# Patient Record
Sex: Male | Born: 1935 | Race: White | Hispanic: No | Smoking: Never smoker
Health system: Southern US, Community
[De-identification: ages and names within clinical notes are randomized; demographics above are authoritative.]

## PROBLEM LIST (undated history)

## (undated) DIAGNOSIS — E7800 Pure hypercholesterolemia, unspecified: Secondary | ICD-10-CM

## (undated) DIAGNOSIS — E119 Type 2 diabetes mellitus without complications: Secondary | ICD-10-CM

## (undated) DIAGNOSIS — I10 Essential (primary) hypertension: Secondary | ICD-10-CM

## (undated) DIAGNOSIS — E039 Hypothyroidism, unspecified: Secondary | ICD-10-CM

---

## 2002-06-30 ENCOUNTER — Ambulatory Visit (HOSPITAL_COMMUNITY): Admission: RE | Admit: 2002-06-30 | Discharge: 2002-06-30 | Payer: Self-pay | Admitting: Internal Medicine

## 2002-10-23 ENCOUNTER — Encounter: Payer: Self-pay | Admitting: Orthopedic Surgery

## 2002-10-23 ENCOUNTER — Encounter: Admission: RE | Admit: 2002-10-23 | Discharge: 2002-10-23 | Payer: Self-pay | Admitting: Orthopedic Surgery

## 2005-07-03 ENCOUNTER — Inpatient Hospital Stay (HOSPITAL_COMMUNITY): Admission: RE | Admit: 2005-07-03 | Discharge: 2005-07-06 | Payer: Self-pay | Admitting: Orthopedic Surgery

## 2006-06-25 ENCOUNTER — Encounter: Admission: RE | Admit: 2006-06-25 | Discharge: 2006-06-25 | Payer: Self-pay | Admitting: Orthopedic Surgery

## 2006-07-11 ENCOUNTER — Encounter: Admission: RE | Admit: 2006-07-11 | Discharge: 2006-07-11 | Payer: Self-pay | Admitting: Orthopedic Surgery

## 2006-08-03 ENCOUNTER — Encounter: Admission: RE | Admit: 2006-08-03 | Discharge: 2006-08-03 | Payer: Self-pay | Admitting: Orthopedic Surgery

## 2006-08-24 ENCOUNTER — Encounter: Admission: RE | Admit: 2006-08-24 | Discharge: 2006-08-24 | Payer: Self-pay | Admitting: Orthopedic Surgery

## 2006-09-07 ENCOUNTER — Ambulatory Visit: Payer: Self-pay | Admitting: Internal Medicine

## 2006-09-07 ENCOUNTER — Emergency Department (HOSPITAL_COMMUNITY): Admission: EM | Admit: 2006-09-07 | Discharge: 2006-09-07 | Payer: Self-pay | Admitting: Emergency Medicine

## 2006-09-14 ENCOUNTER — Ambulatory Visit: Payer: Self-pay | Admitting: Internal Medicine

## 2007-11-21 ENCOUNTER — Inpatient Hospital Stay (HOSPITAL_COMMUNITY): Admission: RE | Admit: 2007-11-21 | Discharge: 2007-11-25 | Payer: Self-pay | Admitting: Orthopedic Surgery

## 2007-11-25 ENCOUNTER — Encounter (INDEPENDENT_AMBULATORY_CARE_PROVIDER_SITE_OTHER): Payer: Self-pay | Admitting: Orthopedic Surgery

## 2007-11-25 ENCOUNTER — Ambulatory Visit: Payer: Self-pay | Admitting: *Deleted

## 2011-01-10 NOTE — Op Note (Signed)
NAME:  Ricky Sandoval, Ricky Sandoval               ACCOUNT NO.:  [id number]   MEDICAL RECORD NO.:  [id number]          PATIENT TYPE:  INP   LOCATION:  0001                         FACILITY:  [facility]   PHYSICIAN:  John L. Rendall, M.D.  DATE OF BIRTH:  1922/07/27   DATE OF PROCEDURE:  11/21/2007  DATE OF DISCHARGE:                               OPERATIVE REPORT   PREOPERATIVE DIAGNOSIS:  Osteoarthritis right knee.   POSTOPERATIVE DIAGNOSIS:  Osteoarthritis right knee.   SURGICAL PROCEDURES:  Right LCS total knee replacement with computer  navigation assistance.   SURGEON:  Dr. Erasmo Leventhal.   ASSISTANT:  Arnoldo Morale, PA-C.   ANESTHESIA:  Spinal.   PATHOLOGY:  The patient has 4 degrees varus and 6 degrees flexion  deformity at the start of the case according to the computer. At the end  of the case, she was one and one respectively.  She had completely worn  out the medial compartment and extensive degeneration in the  patellofemoral articulation.   PROCEDURE:  Under spinal anesthesia, the right leg was prepared with  DuraPrep and draped as a sterile field. A sterile proximal is tourniquet  used.  The leg is wrapped out with an Esmarch and the tourniquet is used  at 300 mm.  A midline incision is made, the patella is everted.  The  knee is sized as a standard plus from the femur.  Debridement is done in  preparation for computer navigation. Two pin holes are placed in the  superomedial tibia through stab wounds and two pins are placed in the  distal medial femur for the arrays and the arrays are set up. At this  point, computer mapping is begun finding the hip center, medial and  lateral malleoli, mapping of the proximal tibia and distal femur.  Once  this was completed. Within 1 mm of anatomic reproducible accuracy.  Attention was turned to the proximal tibial cut.  This was done with the  computer within 1 degree of accuracy.  Once this was completed, the  ligament balancing is done and the  first femoral cut is made on the  anterior and posterior flare of the distal femur within 1 degree of  rotational anatomic alignment.  Once this was completed, the flexion gap  was judged to be approximately 12-1/2, the distal femoral cut was then  made and this turns out to be more near 15 mm. It turns out that with  approximate 15 mm, the alignment was excellent. Attention is then turned  to debriding the posterior recesses and taking spurs off the back of the  femoral condyles.  Once this is completed, the recessing guide is used.  The proximal tibia is then exposed and center peg hole with keel are  inserted.  A trial reduction of a #4 tibia, 15 bearing and a standard  plus femur reveals excellent alignment and slight hyperextension.  Attention is turned to a 17.5 bearing which at first seems a little bit  tight but after several rounds of range of motion it becomes obvious  that this is actually quite a good fit.  The knee is stable to varus  valgus and drawer testing and flexes about 120 degrees on the table with  very little stress. At this point, the patella is osteotomized through a  peg hole, the  trial is done. The arrays were taken down.  Permanent  components were obtained and cement was mixed.  Once the components were  inserted and excess cement was removed  attention is turned to a full  synovectomy of the anterior knee while the  cement hardens.  The tourniquet is then let down at about an hour and 15  minutes.  Multiple small vessels are cauterized.  A medium Hemovac drain  is inserted.  The knee is then closed in layers with #1 Vicryl, 2-0  Vicryl and skin clips.  The patient returned to recovery in good  condition.  Blood loss less than 100 mL.      John L. Rendall, M.D.  Electronically Signed     JLR/MEDQ  D:  11/21/2007  T:  11/21/2007  Job:  [id number]

## 2011-01-10 NOTE — Discharge Summary (Signed)
NAME:  Ricky Sandoval, Ricky Sandoval               ACCOUNT NO.:  [id number]   MEDICAL RECORD NO.:  [id number]          PATIENT TYPE:  INP   LOCATION:  1621                         FACILITY:  [facility]   PHYSICIAN:  John L. Rendall, M.D.  DATE OF BIRTH:  May 01, 1922   DATE OF ADMISSION:  11/21/2007  DATE OF DISCHARGE:  11/25/2007                               DISCHARGE SUMMARY   ADMISSION DIAGNOSIS:  Osteoarthritis of the right knee.   DISCHARGE DIAGNOSES:  1. Osteoarthritis of the right knee.  2. Acute blood loss anemia.  3. Hypertension.  4. Gastroesophageal reflux disease.  5. Diabetes mellitus, diet controlled.  6. Hypercholesterolemia.  7. Hypothyroidism.  8. Sleep apnea.   PROCEDURE:  Right total knee arthroplasty.   HISTORY:  An 77 year old white male with history of left total knee  arthroplasty in February 2006 presented with a 2-year history of sudden  onset of progressive right knee pain.  He denies any injury or trauma.  Pain is intermittent and is a sharp sensation over the medial knee, with  radiation up and down the leg.  It increases with a sharp sensation with  walking and decreases with rest.  Vicodin is moderately helpful.  He  denies any catching, problems with  giving way, or locking.  He has not  had any corticosteroid, viscosupplementation, or use of assistive  devices.  He is admitted at this time for total knee arthroplasty.   HOSPITAL COURSE:  An 77 year old white male, admitted November 21, 2007.  After appropriate laboratory studies were obtained, as well as 1 g of  Ancef IV on call to the operating room, he was taken to the operating  room, where he underwent a right total knee arthroplasty performed by  Dr. Erasmo Leventhal, assisted by Arnoldo Morale, PA-C.  He tolerated the  procedure well.  He was placed on a Dilaudid PCA pump for pain  management.  Ancef 1 g IV q.6 h. x3 doses was given.  He was started on  Arixtra 2.5 mg subcutaneously q. 8 p.m. for 7 days.  Also Celebrex  400  mg p.o. in the PACU and then 200 b.i.d..  Lyrica 50 mg p.o. b.i.d. was  started in the PACU.  OxyContin 10 mg p.o. q.12 h., first dose in the  PACU.  Foley was placed intraoperatively.  CPM was placed from 0-90  degrees for 6-8 hours per day.  Consultations with PT, OT, and care  management were made, allowing weightbearing as tolerated.  A sliding  scale was also ordered in a sensitive protocol.  His Foley was  discontinued on November 22, 2007, and he was allowed out of bed to chair.  His diet was changed to medium to moderate carb.  Sliding scales were  ordered.  The remainder of his hospital course was uneventful and  dedicated to physical and occupational therapy.  Once he was ambulatory  and stable, it was felt that he was a candidate for discharge.  Therefore, we feel that he is able to be transferred to the [facility] facility today after his Dopplers of his right leg.  LABORATORY STUDIES:  He was admitted with a hemoglobin of 12.3,  hematocrit 36.7%, white count 5300, platelets were not available on this  results sheet.  His MCV was 102.  Discharge hemoglobin 9.5, hematocrit  26.4%, white count 7800, platelets 173,000.  Preoperative sodium 136,  potassium 4.5, chloride 102, CO2 27, glucose 98, BUN 19, creatinine  0.18, calcium 9.1, protein 6.4, albumin 3.8, SGOT 28, SGPT 23, alk phos  29, bilirubin 0.7, and GFR was 59.  Discharge sodium 137, potassium 3.9,  chloride 103, CO2 29, glucose 145, BUN 13, creatinine 1.14.  Initial pro  time 12, INR 0.9, PTT was 22.  Urinalysis was benign for voided urine  preoperatively.  Urine culture showed no growth.  Chest x-Ricky Sandoval from  October 07, 2007 revealed no active disease.  EKG of October 07, 2007  was normal.   DISCHARGE INSTRUCTIONS:  He is to be discharged on a medium carb-  modified diet.  Increase his activity slowly.  Use his walker,  weightbearing as tolerated.  No lifting or driving for 6 weeks.  He will  follow the blue  instruction sheet.  CPM from 0-90 degrees for 6-8 hours  per day.   MEDICATIONS:  1. Prilosec OTC 1 p.o. daily to b.i.d.  2. Levothyroxine 0.05 daily.  3. Losartan 50 mg daily.  4. Simvastatin 20 mg at bedtime.  5. Centrum Silver daily.  6. Claritin 10 mg daily.  7. Arixtra 2.5 mg inject subcutaneously at 8 p.m. daily, last dose      November 27, 2007.  8. May begin aspirin 81 mg on November 28, 2007, 1 daily.  9. Celebrex 200 mg p.o. b.i.d. for 7 days, then 1 p.o. daily.  10.OxyContin 10 mg p.o. q.12 h. for 7 days, then discontinue.  11.Percocet 5/325, 1-2 tabs p.o. q.4 h. p.r.n. pain.  12.Oxygen can be used at night time as preoperatively noted.   Exercise program to include range of motion and strengthening, as per  total knee replacement protocol.  The wound should be dressed daily and  coated with Betadine over the staples and then a 4x4 dressing along the  incision line.  If there are any problems with increased swelling,  drainage, or fevers, he is to call the office immediately.  Chest pain  or  shortness of breath, call 9-1-1.  Otherwise, will follow back up  with the office on Tuesday December 03, 2007 for Dr. Priscille Kluver to remove his  staples.      Oris Drone Petrarca, P.A.-C.      Carlisle Beers. Rendall, M.D.  Electronically Signed    BDP/MEDQ  D:  11/25/2007  T:  11/25/2007  Job:  [id number]   cc:   Jonny Ruiz L. Rendall, M.D.  Fax: [phone]   Legrand Pitts. Duffy, P.A.

## 2011-01-13 NOTE — Assessment & Plan Note (Signed)
[facility]                             PULMONARY OFFICE NOTE   Ricky, Sandoval                      MRN:          [id number]  DATE:09/07/2006                            DOB:          04/19/1922    CHIEF COMPLAINT:  Cough, dyspnea.   HISTORY:  An 77 year old white male with a chronic cough dating back  several years consisting mostly of a sensation of scratchy and throat  drainage that bothers him mostly at night.  He had a recent flare up of  these symptoms several weeks ago and has been under treatment by Dr.  Felipa Eth with treatment directed at rhinitis and sinusitis but had a severe  spell where he coughed so hard he could not get his breath and went to  the emergency room this morning where he was treated as an asthmatic  with prednisone and inhalers.  He therefore referred himself to me for  pulmonary evaluation.   Presently, the patient is comfortable except he has no voice.  He has  very difficult time with phonation and has classic voice fatigue; that  is the more he talks the more hoarse he becomes.  He did not have any  choking spells, however, during our conservation.  He also has  dysphagia and sensation of sore throat but no fever, orthopnea, PND or  leg swelling.  No chest pain of any kind and he states if he is not  coughing or choking his breathing is fine.  In between these spells.   PAST MEDICAL HISTORY:  Significant for hypertension for which she has  been on ACE inhibitors.  Dr. Felipa Eth had thought the lisinopril had been  stopped a week ago per my conversation with him today but the patient is  still taking it.  He also has hyperlipidemia and diabetes and sinus  problems.   ALLERGIES:  None known.   MEDICATIONS:  Presently, the patient is on Augmentin per Dr. Felipa Eth,  aspirin, Zocor, nabutametone, flunisolide nasal spray, Levothroid,  lisinopril and Astelin, along with Tussin p.r.n.   SOCIAL HISTORY:  The patient has never  smoked.  She is retired but no  unusual travel, pet or hobby exposure.   FAMILY HISTORY:  Recorded in detail and on the worksheet significant for  the absence of respiratory disease in H&P.   REVIEW OF SYSTEMS:  Taken in detail on the worksheet.  Significant for  problems as outlined above.   PHYSICAL EXAMINATION:  This is a pleasant, elderly white male in no  acute distress with classic voice fatigue as noted.  He has afebrile  with a blood pressure of 120/60.  HEENT is remarkable for frequent  throat clearing; however, there is no evidence of excessive postnasal  drainage or cobblestoning.  Nasal turbinates are normal.  Ear canals are  clear bilaterally.  Neck was supple without cervical adenopathy or  tenderness.  Trachea is midline.  No thyromegaly.  Lung fields are  perfectly clear except for classic pseudowheeze.  There a regular rhythm  without rub or gallop with a 1/6 systolic ejection murmur.  Abdomen  is  soft and benign.  Extremities are warm without calf tenderness,  cyanosis, clubbing or edema.   IMPRESSION:  Classic upper airway dysfunction associated with choking,  dysphagia and a sore throat all of which are occurring in a patient on  chronic ACE inhibitors who in retrospect has been having trouble with  increased throat clearing dating back several years.  Most likely, he  has tolerated a level of ACE effect on his upper airway until  his  recent upper respiratory tract infection/sinus infection for which he is  under treatment by Dr. Felipa Eth already.   Therefore, I have recommended the following:  1. Definitely he should stop lisinopril as Dr. Felipa Eth had already      recommended and start Benicar 40 one-half daily in its place.  I      took the bottle from him, made sure he understood which one it was      and gave him written instructions in large print so he does not      continue the lisinopril surreptitiously.  2. Empirically, I am going to start him on Protonix 40  mg 30 minutes      before his first and last meal just until he is better since reflux      can aggravate upper airway instability via ACE inhibitors in a      synergistic fashion.  3. Complete course of Augmentin.  4. Six days of prednisone should be all that is necessary to control      the upper airway inflammatory component as the problem.  5. He is still coughing and I would like him to use Tramadol 50 mg one      every 4 hours p.r.n. to eliminate a cyclical pattern from      developing.   I assured the patient that he will gradually improve over the next  several weeks but the benefit would not be immediate and that he could  control the cough with the Tramadol in the meantime as well as using  nonmenthol containing lozenges and cough drops and sips of water to  prevent the choking sensation and excessive throat clearing from  contributing to upper airway instability and further trauma through a  cyclical mechanism.     Charlaine Dalton. Sherene Sires, MD, [facility]  Electronically Signed    MBW/MedQ  DD: 09/08/2006  DT: 09/08/2006  Job #: [id number]   cc:   Larina Earthly, M.D.

## 2011-01-13 NOTE — Assessment & Plan Note (Signed)
[facility]                             PULMONARY OFFICE NOTE   Ricky Sandoval, INTRIAGO                      MRN:          [id number]  DATE:09/14/2006                            DOB:          February 19, 1922    The patient is an 77 year old white male patient of Dr. Sherene Sires, who has a  known history of chronic cough, returns to the office today complaining  of persistent cough.  The patient was seen in the office last week and  prescribed a prednisone taper, and tramadol as needed for cough  suppression.  The patient also was changed off of his ACE inhibitor over  to Benicar.  The patient had previously been placed on Augmentin by his  primary care physician and was instructed to finish this.  The patient  has now finished Augmentin and prednisone, and is using cough tramadol  for cough control.  However, reports his symptoms have improved, but  have not resolved.  The patient denies any hemoptysis, chest pain,  orthopnea, PND, or leg swelling.  The patient does report he had some  loose stools, but that seems to be improving.   PAST MEDICAL HISTORY:  Reviewed.   CURRENT MEDICATIONS:  Reviewed.   PHYSICAL EXAM:  The patient is an elderly male in no acute distress.  He is afebrile with stable vital signs.  O2 saturation is 94% on room  air.  HEENT:  Unremarkable.  NECK:  Supple without adenopathy.  No JVD.  LUNGS:  Sounds reveal diminished breath sounds in the bases.  CARDIAC:  Regular rate and rhythm.  ABDOMEN:  Soft and nontender.  EXTREMITIES:  Warm without any edema.   IMPRESSION AND PLAN:  1. Recent tracheobronchitis with persistent cough.  The patient is      advised to add in Mucinex DM twice daily.  The patient does have      frequent throat-clearing throughout the exam with complaints of      some post-nasal drip.  The patient is to add in Claritin daily and      tooth use saline nasal spray in addition to aggressive cough      suppression with  tramadol and non-mint lozenges.  The patient will      remain off of his ACE inhibitor due to upper airway instability.      The patient was given a Xopenex nebulizer treatment in the office      today.  The patient will      return back with Dr. Sherene Sires as scheduled.  2. Hypertension is well-maintained on Benicar.      Rubye Oaks, NP  Electronically Signed      Charlaine Dalton. Sherene Sires, MD, [facility]  Electronically Signed   TP/MedQ  DD: 09/17/2006  DT: 09/17/2006  Job #: [id number]

## 2011-01-13 NOTE — Op Note (Signed)
NAME:  Ricky Sandoval, Ricky Sandoval               ACCOUNT NO.:  [id number]   MEDICAL RECORD NO.:  [id number]          PATIENT TYPE:  INP   LOCATION:  X002                         FACILITY:  [facility]   PHYSICIAN:  John L. Rendall, M.D.  DATE OF BIRTH:  Oct 28, 1921   DATE OF PROCEDURE:  07/03/2005  DATE OF DISCHARGE:                                 OPERATIVE REPORT   PREOPERATIVE DIAGNOSIS:  Osteoarthritis left knee.   POSTOPERATIVE DIAGNOSIS:  Osteoarthritis left knee.   SURGICAL PROCEDURES:  Left LCS total knee replacement with computer  navigation assistance.   SURGEON:  John L. Rendall, M.D.   ASSISTANT:  Legrand Pitts. Duffy, P.A.-C.   ANESTHESIA:  Spinal.   PATHOLOGY:  Bone against bone medially in an 77 year old whose tried all  conservative measures. The patient has 5 degrees varus and 4 degrees fixed  flexion from anatomical axis. At the start of computer navigation, there was  bone against bone medially with spurs around the entire distal femur. At the  end of the procedure, there was 1 degree of valgus and 1 degree of flexion.   DESCRIPTION OF PROCEDURE:  Under spinal anesthesia, the left leg was  prepared with DuraPrep and draped as a sterile field. A sterile Esmarch was  used and a tourniquet and the tourniquet is elevated to 350 mm. A midline  incision is made, the patella is everted. The femur is sized at a large.  Debridement of the knee is done in preparation for computer navigation. At  this point, Schanz pins are placed in the proximal medial tibia and the  distal medial femur. With the navigation pins in place, the hip center is  found, the medial and lateral malleolus and then mapping of the proximal  tibia and distal femur is done. Once these are completed, the tibial guide  is used with the computer for a proximal tibial resection. The balancer is  then inserted and it takes an extensive release on the medial tibia to  balance the extension gap. With this corrected and the  flexion gaps  measured, the rest of the case is planned. The large tibial component is to  be used and approximately a 12.5 bearing. At this point, the first distal  femoral cuts were made on the anterior and posterior distal femur with  computer navigation assistance. The distal femoral cut is made. All cuts are  within 1 degree of anatomical axis. Once this was completed, the recessing  guide is used. Care was taken following this to debride remnants of the  menisci and cruciate's and take spurs off the back of the femoral condyles.  The McKale and Homan are then inserted, the tibia is sized to a #4, center  peg hole with keel is inserted. Trial reduction of #4 tibia,  12.5 bearing  and large femur reveals excellent fit, alignment and stability. The patella  is then osteotomized, three PEG holes inserted. With the patella in place  and the capsule closed, the knee is in 1 degree of anatomical axis and 1  degree of being fully straight. At this point, the Schanz  pins are removed,  permanent components are obtained. Bony surfaces prepared with pulse  irrigation and all components are cemented in place.  Once the cement is hardened, the tourniquet is let down at an hour and 15  minutes. Excess cement was removed, multiple small vessels are cauterized.  The knee is then closed in layers with #1 Tycron, #1 and 2-0 Vicryl and skin  clips. The patient tolerated the procedure well and returned to recovery in  good condition.      John L. Rendall, M.D.  Electronically Signed     JLR/MEDQ  D:  07/03/2005  T:  07/03/2005  Job:  [id number]

## 2011-01-13 NOTE — H&P (Signed)
NAME:  Ricky Sandoval, Ricky Sandoval               ACCOUNT NO.:  [id number]   MEDICAL RECORD NO.:  [id number]          PATIENT TYPE:  INP   LOCATION:  NA                           FACILITY:  [facility]   PHYSICIAN:  John L. Rendall, M.D.  DATE OF BIRTH:  11-12-1921   DATE OF ADMISSION:  DATE OF DISCHARGE:                                HISTORY & PHYSICAL   DATE OF ADMISSION:  July 03, 2005   CHIEF COMPLAINT:  Left knee pain.   HISTORY OF PRESENT ILLNESS:  Ricky Sandoval is a pleasant 77 year old white male  with left knee pain since 2001. The patient did have a left knee scope in  September 2003 at which time he underwent a knee arthroscopy with medial  meniscectomy, chondroplasty, and microfraction medial femoral condyle. The  patient did relatively well until late August 2006 whenever he developed  dull achy pain with weightbearing activity or twisting motions. He denies  any mechanical symptoms. Does not use any assistive devices. Has  questionable waking pain. X-rays of the left knee reveal bone against bone  in the medial compartment.   ALLERGIES:  No known drug allergies. The patient does have allergies to DOG  and CAT; also, to GRASS, POLLENS. Denies any metal allergies.   MEDICATIONS:  1.  Simvastatin 20 mg once daily q.h.s.  2.  Levoxyl 50 mcg one-half tablet q.a.m. one-half hour prior to meals.  3.  Piroxicam 10 mg one p.o. b.i.d.  4.  Vicodin 5/500 mg p.r.n. pain.  5.  Aspirin 81 mg daily, stopped October 26.  6.  Flunisolide 0.025% b.i.d.  7.  Centrum Silver one p.o. daily.  8.  Gingko biloba 30 mg one p.o. b.i.d.  9.  Glucosamine chondroitin one p.o. b.i.d.  10. Salmon fish oil with Omega-3 fatty acids one p.o. daily.  11. Claritin 10 mg one p.o. q.a.m.  12. Weekly allergy shots, two shots each Monday.  13. Prostater one p.o. b.i.d.   PAST MEDICAL HISTORY:  1.  Diet- and exercise-controlled diabetes mellitus type 2.  2.  Hypertension.  3.  Postnasal drip.  4.  Allergies.  5.   Hyperlipidemia.  6.  Hypothyroidism.   The patient denies any cardiac or respiratory disease.   PAST SURGICAL HISTORY:  1.  Carpal tunnel surgery.  2.  Nasal surgery 25 years ago.  3.  Left knee arthroscopy in 2003 by Dr. Priscille Kluver.   The patient denies any complications with the above procedures. Denies any  blood transfusions in the past.   SOCIAL HISTORY:  The patient denies any tobacco or alcohol use. He is  married and has three grown children. He lives in a [place] home with  three steps to the usual entrance. He is retired. Primary care physician is  Dr. Felipa Eth, [facility], [place], [place].   FAMILY HISTORY:  The patient's mother deceased at age 59 with diabetes  mellitus. Father deceased at age 55 with coronary artery disease and  hypertension. Has two deceased brothers, both with cardiac disease, one age  32, the other age 50 at the time of death. The  patient has one sister who is  64 years old. She has diabetes mellitus, heart disease, history of  esophageal/throat cancer.   REVIEW OF SYSTEMS:  The patient denies any recent cough, cough, fever, or  flu-like symptoms. Does have allergies to cats, dogs, and pollens. Has  diabetes mellitus which is diet and exercise controlled. Hypertension, new  diagnosis. The patient denies any chest pain, shortness of breath,  orthopnea, or PND. The patient does have some shortness of breath with  exertion greater than three to four blocks. No GI or GU symptoms. The  patient does have a Living Will and a power of attorney, Vella Raring,  wife. Otherwise, review of systems is negative or noncontributory.   PHYSICAL EXAMINATION:  GENERAL:  The patient is a well-developed, well-  nourished male who walks with a slight limp and antalgic gait on the left.  He uses no assistive devices to ambulate. The patient's mood and affect are  appropriate, talks easily with examiner. The patient's height is 5 feet 6.5   inches, weight is 198 pounds.  VITAL SIGNS:  Temperature 96.4, blood pressure 150/80, respiratory rate 16,  pulse is 80.  HEENT:  Head is normocephalic, atraumatic, without frontal or maxillary  sinus tenderness to palpation. Conjunctivae are pink and moist. Sclerae are  nonicteric. PERRLA. EOMs are intact. No visible external ear deformities  noted. TMs pearly and gray bilaterally. Nose and nasal septum midline, nasal  mucosa pink and moist. Buccal mucosa pink and moist. The patient has good  dental repair. Pharynx without erythema or exudate, tongue and uvula  midline.  NECK:  Trachea is midline. No lymphadenopathy. Carotids 2+ without bruits.  No tenderness with palpation along the cervical spine. The patient has full  range of motion of the cervical spine without pain.  BACK:  Nontender to palpation over the thoracic and lumbar spinal columns.  CARDIAC:  Regular rate and rhythm. No murmurs, rubs, or gallops noted.  CHEST:  Lungs clear to auscultation bilaterally. No wheezing, rhonchi, or  rales noted. No accessory muscles used for respiratory compensation.  ABDOMEN:  Soft, nontender, bowel sounds x4 quadrants.  BREAST, GENITOURINARY, AND RECTAL:  All deferred at this time.  NEUROLOGIC:  The patient is alert and oriented x3. Cranial nerves II-XII  grossly intact. Lower extremity strength testing reveals 5/5 strength  throughout. Deep tendon reflexes are 2+ at the knees bilaterally, ankle  jerks are 1+ bilaterally.  MUSCULOSKELETAL, UPPER EXTREMITIES:  The patient has full range of motion of  the upper extremities at the shoulders, elbows, wrists. Does have some  degenerative changes of the hands; otherwise, the upper extremities are  equal and symmetric in size and shape. Radial pulses are 2+ bilaterally.  MUSCULOSKELETAL, LOWER EXTREMITIES:  The patient has full range of motion of both hips without pain. Flexion to 90 degrees of both hips bilaterally  causes no pain. Bilateral knees  0-120 degrees with flexion. The patient has  medial joint line pain with palpation over the left knee. The left knee with  approximate 5-degree varus deformity. Anterior drawers negative bilaterally.  Valgus-varus stressing is negative bilaterally. The lower extremities are  otherwise nonedematous and posterior tibial pulses are 2+ bilaterally. The  patient has good sensation in those throughout.   X-rays dated May 04, 2005, of the left knee show bone-on-bone medially.  In Dr. Vicente Males note of May 18, 2005, the patient had an EKG on  May 09, 2005, which showed normal sinus rhythm with no acute changes.  IMPRESSION:  1.  End-stage osteoarthritis, left knee.  2.  Diabetes mellitus type 2 controlled with diet and exercise.  3.  New diagnosis of hypertension.  4.  Allergies.  5.  Hyperlipidemia.  6.  Hypothyroidism.  7.  Posterior nasal drainage.   PLAN:  The patient is to be admitted to [facility] on July 03, 2005, to undergo a left knee arthroscopy by Dr. Priscille Kluver using computer  navigation. Prior to surgery, the patient will undergo all preoperative labs  and testing. The patient did receive medical clearance from Dr. Felipa Eth.   The patient was placed on a blood pressure medicine of unknown dosage and  name. The patient is to bring all medications to his anesthesia evaluation  so he can be put on the proper medications during his hospitalization.      Richardean Canal, P.A.      John L. Rendall, M.D.  Electronically Signed    GC/MEDQ  D:  06/23/2005  T:  06/23/2005  Job:  [id number]

## 2011-01-13 NOTE — Assessment & Plan Note (Signed)
[facility]                                 ON-CALL NOTE   BRYLEY, KOVACEVIC                      MRN:          [id number]  DATE:09/09/2006                            DOB:          June 21, 1922    I had received a call from Mr. Ricky Sandoval today with regards to his feelings  of choking.  He says that, what has been happening is, after he had seen  Dr. Sherene Sires on Friday, he felt better that night and on Saturday, but today  he has been having more nasal congestion, and then when he tries to  either sit down or lay down, he feels like fluid is draining down the  back of his throat, which causes him to choke and gag, and as a result,  he has difficulty with breathing and gets quite anxious.  He says that,  as soon as he sits up or leans forward, that these symptoms get better.  He has been using his flunisolide nasal spray, and he is still on  prednisone and Augmentin.  I have suggested that he could try using  nasal irrigation, and I have discussed the proportions for this with his  wife.  I have also advised him that he could try using Afrin for the  next 1 or 2 days to see if this improves his congestion.  I have advised  them, however, if he is still symptomatic after this, that he may need  to come to the emergency room, particularly if he feels that this is  significantly compromising his breathing.  However, when I talked to him  on the phone, he seemed to be quite comfortable with his breathing and  was not complaining of any other symptoms besides the ones I have  described above.     Coralyn Helling, MD  Electronically Signed    VS/MedQ  DD: 09/09/2006  DT: 09/09/2006  Job #: [id number]   cc:   Charlaine Dalton. Sherene Sires, MD, FCCP  Larina Earthly, M.D.

## 2011-01-13 NOTE — Discharge Summary (Signed)
NAME:  Ricky Sandoval, Ricky Sandoval               ACCOUNT NO.:  [id number]   MEDICAL RECORD NO.:  [id number]          PATIENT TYPE:  INP   LOCATION:  1513                           FACILITY:   PHYSICIAN:  John L. Rendall, M.D.  DATE OF BIRTH:  [birth year]   DATE OF ADMISSION:  07/03/2005  DATE OF DISCHARGE:  07/06/2005                                 DISCHARGE SUMMARY   ADMISSION DIAGNOSES:  1.  End-stage osteoarthritis, left knee.  2.  Type 2 diabetes mellitus.  3.  Hypertension.  4.  Allergies.  5.  Hyperlipidemia.  6.  Hypothyroidism.  7.  Mild posterior nasal drainage.   DISCHARGE DIAGNOSES:  1.  End-stage osteoarthritis, left knee, status post left total knee      arthroplasty.  2.  Acute blood loss anemia secondary to surgery.  3.  Hyponatremia, now resolved.  4.  Constipation.  5.  Type 2 diabetes mellitus.  6.  Hypertension.  7.  Allergies.  8.  Hyperlipidemia.  9.  Hypothyroidism.  10. Posterior nasal drainage.   SURGICAL PROCEDURE:  On 07/03/2005, Ricky Sandoval a left total knee  arthroplasty with computer navigation by Jonny Ruiz L. Rendall, M.D. assisted by  Legrand Pitts. Duffy, P.A.-C.  He had a LCS completed primary femoral component  cemented inside a large left with an LCS complete metal-backed patella  cemented size large.  A DePuy MBP keel tibial tray cemented size 4 and LCS  complete RP and insert size large 12.5 mm thickness.   COMPLICATIONS:  None.   CONSULTATIONS:  1.  Physical therapy, case management consult on July 04, 2005.  2.  Occupational therapy consult on July 05, 2005.   HISTORY OF PRESENT ILLNESS:  This 77 year old white male patient presented  to Dr. Priscille Kluver with left knee pain since 2001.  His pain is now a dull and  aching sensation with weightbearing or twisting movements.  There are no  mechanical symptoms.  X-rays have shown end-stage changes.  Because of the  failure of conservative treatment and continued pain, he is presenting for a  left  knee replacement.   HOSPITAL COURSE:  Ricky Sandoval the surgical procedure well and  without immediate postoperative complications.  He was transferred to 5  [place].  On postoperative day #1, he was afebrile with vital signs stable.  Sodium was low at 129.  His lisinopril was held and that was monitored.  Hemoglobin was 11.3, hematocrit 32.  His pain was well controlled with  medications.  He was weaned off O2 and started on therapy.   On postoperative day #2, T-max was 101.1.  Hemoglobin stable at 10.7,  hematocrit 31.  Sodium improved to 134.  He was switched to p.o. pain  medications and continued on therapy.  He did well over the next day or so.  He remained afebrile.  His pain was well controlled with p.o. medications  and he is doing well enough and it is felt that he is ready for discharge  home today, July 06, 2005.   DISCHARGE INSTRUCTIONS:   DIET:  He can resume his regular  prehospitalization diet.   MEDICATIONS:  He may resume his prehospitalization medications, except no  aspirin, piroxicam ov Vicodin while on Arixtra.   HOME MEDICATIONS:  At this time include:  1.  Simvastatin 40 mg half-tablet p.o. nightly.  2.  Piroxicam 10 mg p.o. twice daily which he is not to take.  3.  Lisinopril 20 mg half-tablet p.o. nightly.  4.  Levoxyl 50 mcg 1 p.o. every morning.  5.  Vicodin 1-2 tablets p.o. every 4 hours p.r.n. pain.  He is not to use      this while he is on Percocet.  6.  Flunisolide 25 mcg nasal inhalation 1-2 sprays each naris twice daily.  7.  Arthro 7 one tablet p.o. 3 times a day.  8.  Vitamin C 500 mg 1 tablet every morning.  9.  Vitamin E/200 lean 1 tablet p.o. every morning.  10. Prostata 1 tablet p.o. 3 times a day.  11. Allergy shots every Monday.  12. Aspirin 81 mg 1 p.o. every morning to resume once Arixtra completed.  13. Claritin 10 mg 1 tablet p.o. every morning.  14. Gingko biloba 30 mg p.o. every morning not to take for a couple of weeks       after surgery.  15. Chondroitin and glucosamine 1 tablet p.o. daily.   ADDITIONAL MEDICATIONS AT THIS TIME:  1.  Arixtra 2.5 mcg subcu at 8:00 p.m., last dose to be on July 09, 2005, #3 of these with no refill.  2.  OxyContin 10 mg 1 tablet p.o. every 12 hours, #18 with no refill.  3.  Percocet 5/325 mg 1-2 tablets p.o. every 4 hours p.r.n. for pain, #50      with no refill.  4.  Robaxin 500 mg 1-2 tablets p.o. every 6 hours p.r.n. for spasms, #40      with no refill.   ACTIVITY:  He may be out of bed, weightbearing as Sandoval on the left leg  with the use of a walker.  He is to have home CPM 0-100 degrees 6-8 hours a  day, and home health PT per [place].  Please see the blue total knee  discharge information on the discharge sheet for further activity  instructions.   WOUND CARE:  He may shower if there is no drainage from the wound for 2  days.  Please see the blue total knee discharge sheet for further wound care  instructions.   FOLLOW UP:  He needs to followup with Dr. Priscille Kluver in our office in  approximately 10-12 days and needs to call [phone] for that appointment.   LABORATORY DATA:  Hemoglobin and hematocrit ranged from 13.4 and 38.8 on  June 27, 2005 to a low of 10.7 and 31 on November 8.  Platelets and white  count were within normal limits.   On June 27, 2005 potassium was 5.2, and it went down to 4 on November 8.  Sodium ranged from 140 on June 27, 2005 to a low of 129 on November 7 to  134 on November 8.  Glucose has ranged from 113 on October 31 to 160 on  November 8.  BUN and creatinine were 24 and 1.3 on October 31 and on  November 17 and 1.1.  Calcium is 8.3 on November 8.  All other laboratory  studies were within normal limits.      Legrand Pitts Duffy, P.A.      John L. Rendall, M.D.  Electronically  Signed    KED/MEDQ  D:  07/06/2005  T:  07/06/2005  Job:  [id number]   cc:   Larina Earthly, M.D.  Fax: [phone]

## 2011-05-22 LAB — CROSSMATCH

## 2011-05-22 LAB — CBC
HCT: 36.7 — ABNORMAL LOW
Hemoglobin: 10 — ABNORMAL LOW
Hemoglobin: 12.3 — ABNORMAL LOW
MCHC: 33.5
MCHC: 35.3
MCHC: 35.4
MCHC: 35.9
MCV: 101.2 — ABNORMAL HIGH
Platelets: 144 — ABNORMAL LOW
Platelets: 188
RBC: 2.79 — ABNORMAL LOW
RDW: 12.2
RDW: 12.3
WBC: 5.3
WBC: 6.4
WBC: 7.8

## 2011-05-22 LAB — COMPREHENSIVE METABOLIC PANEL
ALT: 23
AST: 28
Alkaline Phosphatase: 29 — ABNORMAL LOW
BUN: 19
GFR calc Af Amer: >60
Glucose, Bld: 98
Potassium: 4.5
Sodium: 136
Total Bilirubin: 0.7

## 2011-05-22 LAB — BASIC METABOLIC PANEL
Calcium: 8.5
Calcium: 8.5
Chloride: 102
Creatinine, Ser: 1.06
GFR calc non Af Amer: >60
Glucose, Bld: 145 — ABNORMAL HIGH
Glucose, Bld: 149 — ABNORMAL HIGH
Potassium: 3.9
Potassium: 4
Sodium: 138

## 2011-05-22 LAB — DIFFERENTIAL
Basophils Relative: 1
Eosinophils Absolute: 0.2
Lymphocytes Relative: 33
Monocytes Absolute: 0.6
Monocytes Relative: 11

## 2011-05-22 LAB — URINALYSIS, ROUTINE W REFLEX MICROSCOPIC
Glucose, UA: NEGATIVE
Ketones, ur: NEGATIVE
Protein, ur: NEGATIVE
Urobilinogen, UA: 0.2

## 2011-05-22 LAB — URINE CULTURE

## 2011-05-22 LAB — PROTIME-INR: Prothrombin Time: 12

## 2011-05-22 LAB — APTT: aPTT: 22 — ABNORMAL LOW

## 2011-05-22 LAB — ABO/RH: ABO/RH(D): A NEG

## 2011-09-04 DIAGNOSIS — J309 Allergic rhinitis, unspecified: Secondary | ICD-10-CM | POA: Diagnosis not present

## 2011-09-18 DIAGNOSIS — J309 Allergic rhinitis, unspecified: Secondary | ICD-10-CM | POA: Diagnosis not present

## 2011-09-26 DIAGNOSIS — J309 Allergic rhinitis, unspecified: Secondary | ICD-10-CM | POA: Diagnosis not present

## 2011-10-03 DIAGNOSIS — J309 Allergic rhinitis, unspecified: Secondary | ICD-10-CM | POA: Diagnosis not present

## 2011-10-10 DIAGNOSIS — J309 Allergic rhinitis, unspecified: Secondary | ICD-10-CM | POA: Diagnosis not present

## 2011-10-17 DIAGNOSIS — J309 Allergic rhinitis, unspecified: Secondary | ICD-10-CM | POA: Diagnosis not present

## 2011-10-24 DIAGNOSIS — J309 Allergic rhinitis, unspecified: Secondary | ICD-10-CM | POA: Diagnosis not present

## 2011-10-26 DIAGNOSIS — J309 Allergic rhinitis, unspecified: Secondary | ICD-10-CM | POA: Diagnosis not present

## 2011-11-01 DIAGNOSIS — J309 Allergic rhinitis, unspecified: Secondary | ICD-10-CM | POA: Diagnosis not present

## 2011-11-07 DIAGNOSIS — J309 Allergic rhinitis, unspecified: Secondary | ICD-10-CM | POA: Diagnosis not present

## 2011-11-14 DIAGNOSIS — J309 Allergic rhinitis, unspecified: Secondary | ICD-10-CM | POA: Diagnosis not present

## 2011-11-21 DIAGNOSIS — J309 Allergic rhinitis, unspecified: Secondary | ICD-10-CM | POA: Diagnosis not present

## 2011-11-29 DIAGNOSIS — J309 Allergic rhinitis, unspecified: Secondary | ICD-10-CM | POA: Diagnosis not present

## 2011-12-05 DIAGNOSIS — J309 Allergic rhinitis, unspecified: Secondary | ICD-10-CM | POA: Diagnosis not present

## 2011-12-07 DIAGNOSIS — J309 Allergic rhinitis, unspecified: Secondary | ICD-10-CM | POA: Diagnosis not present

## 2011-12-13 DIAGNOSIS — J309 Allergic rhinitis, unspecified: Secondary | ICD-10-CM | POA: Diagnosis not present

## 2011-12-18 DIAGNOSIS — J309 Allergic rhinitis, unspecified: Secondary | ICD-10-CM | POA: Diagnosis not present

## 2011-12-20 DIAGNOSIS — J309 Allergic rhinitis, unspecified: Secondary | ICD-10-CM | POA: Diagnosis not present

## 2011-12-26 DIAGNOSIS — J309 Allergic rhinitis, unspecified: Secondary | ICD-10-CM | POA: Diagnosis not present

## 2012-01-01 DIAGNOSIS — J309 Allergic rhinitis, unspecified: Secondary | ICD-10-CM | POA: Diagnosis not present

## 2012-01-08 DIAGNOSIS — J309 Allergic rhinitis, unspecified: Secondary | ICD-10-CM | POA: Diagnosis not present

## 2012-01-15 DIAGNOSIS — J309 Allergic rhinitis, unspecified: Secondary | ICD-10-CM | POA: Diagnosis not present

## 2012-01-23 DIAGNOSIS — J309 Allergic rhinitis, unspecified: Secondary | ICD-10-CM | POA: Diagnosis not present

## 2012-01-30 DIAGNOSIS — J309 Allergic rhinitis, unspecified: Secondary | ICD-10-CM | POA: Diagnosis not present

## 2012-02-05 DIAGNOSIS — J309 Allergic rhinitis, unspecified: Secondary | ICD-10-CM | POA: Diagnosis not present

## 2012-02-12 DIAGNOSIS — D509 Iron deficiency anemia, unspecified: Secondary | ICD-10-CM | POA: Diagnosis not present

## 2012-02-12 DIAGNOSIS — I10 Essential (primary) hypertension: Secondary | ICD-10-CM | POA: Diagnosis not present

## 2012-02-12 DIAGNOSIS — E785 Hyperlipidemia, unspecified: Secondary | ICD-10-CM | POA: Diagnosis not present

## 2012-02-12 DIAGNOSIS — G4736 Sleep related hypoventilation in conditions classified elsewhere: Secondary | ICD-10-CM | POA: Diagnosis not present

## 2012-02-12 DIAGNOSIS — J309 Allergic rhinitis, unspecified: Secondary | ICD-10-CM | POA: Diagnosis not present

## 2012-02-12 DIAGNOSIS — E039 Hypothyroidism, unspecified: Secondary | ICD-10-CM | POA: Diagnosis not present

## 2012-02-12 DIAGNOSIS — E119 Type 2 diabetes mellitus without complications: Secondary | ICD-10-CM | POA: Diagnosis not present

## 2012-02-13 DIAGNOSIS — E039 Hypothyroidism, unspecified: Secondary | ICD-10-CM | POA: Diagnosis not present

## 2012-02-13 DIAGNOSIS — I10 Essential (primary) hypertension: Secondary | ICD-10-CM | POA: Diagnosis not present

## 2012-02-13 DIAGNOSIS — E785 Hyperlipidemia, unspecified: Secondary | ICD-10-CM | POA: Diagnosis not present

## 2012-02-15 DIAGNOSIS — G4736 Sleep related hypoventilation in conditions classified elsewhere: Secondary | ICD-10-CM | POA: Diagnosis not present

## 2012-02-19 DIAGNOSIS — J309 Allergic rhinitis, unspecified: Secondary | ICD-10-CM | POA: Diagnosis not present

## 2012-02-26 DIAGNOSIS — J309 Allergic rhinitis, unspecified: Secondary | ICD-10-CM | POA: Diagnosis not present

## 2012-03-04 DIAGNOSIS — J309 Allergic rhinitis, unspecified: Secondary | ICD-10-CM | POA: Diagnosis not present

## 2012-03-11 DIAGNOSIS — J309 Allergic rhinitis, unspecified: Secondary | ICD-10-CM | POA: Diagnosis not present

## 2012-03-14 DIAGNOSIS — E119 Type 2 diabetes mellitus without complications: Secondary | ICD-10-CM | POA: Diagnosis not present

## 2012-03-14 DIAGNOSIS — H40059 Ocular hypertension, unspecified eye: Secondary | ICD-10-CM | POA: Diagnosis not present

## 2012-03-14 DIAGNOSIS — H3530 Unspecified macular degeneration: Secondary | ICD-10-CM | POA: Diagnosis not present

## 2012-03-14 DIAGNOSIS — H1045 Other chronic allergic conjunctivitis: Secondary | ICD-10-CM | POA: Diagnosis not present

## 2012-03-18 DIAGNOSIS — J309 Allergic rhinitis, unspecified: Secondary | ICD-10-CM | POA: Diagnosis not present

## 2012-03-20 DIAGNOSIS — J309 Allergic rhinitis, unspecified: Secondary | ICD-10-CM | POA: Diagnosis not present

## 2012-03-25 DIAGNOSIS — J309 Allergic rhinitis, unspecified: Secondary | ICD-10-CM | POA: Diagnosis not present

## 2012-04-01 DIAGNOSIS — J309 Allergic rhinitis, unspecified: Secondary | ICD-10-CM | POA: Diagnosis not present

## 2012-04-08 DIAGNOSIS — J309 Allergic rhinitis, unspecified: Secondary | ICD-10-CM | POA: Diagnosis not present

## 2012-04-15 DIAGNOSIS — J309 Allergic rhinitis, unspecified: Secondary | ICD-10-CM | POA: Diagnosis not present

## 2012-04-22 DIAGNOSIS — J309 Allergic rhinitis, unspecified: Secondary | ICD-10-CM | POA: Diagnosis not present

## 2012-04-24 DIAGNOSIS — J301 Allergic rhinitis due to pollen: Secondary | ICD-10-CM | POA: Diagnosis not present

## 2012-04-24 DIAGNOSIS — H6120 Impacted cerumen, unspecified ear: Secondary | ICD-10-CM | POA: Diagnosis not present

## 2012-04-24 DIAGNOSIS — H60339 Swimmer's ear, unspecified ear: Secondary | ICD-10-CM | POA: Diagnosis not present

## 2012-04-30 DIAGNOSIS — J309 Allergic rhinitis, unspecified: Secondary | ICD-10-CM | POA: Diagnosis not present

## 2012-05-06 DIAGNOSIS — J309 Allergic rhinitis, unspecified: Secondary | ICD-10-CM | POA: Diagnosis not present

## 2012-05-10 DIAGNOSIS — M4120 Other idiopathic scoliosis, site unspecified: Secondary | ICD-10-CM | POA: Diagnosis not present

## 2012-05-10 DIAGNOSIS — M47817 Spondylosis without myelopathy or radiculopathy, lumbosacral region: Secondary | ICD-10-CM | POA: Diagnosis not present

## 2012-05-10 DIAGNOSIS — M48061 Spinal stenosis, lumbar region without neurogenic claudication: Secondary | ICD-10-CM | POA: Diagnosis not present

## 2012-05-13 DIAGNOSIS — J309 Allergic rhinitis, unspecified: Secondary | ICD-10-CM | POA: Diagnosis not present

## 2012-05-14 DIAGNOSIS — J309 Allergic rhinitis, unspecified: Secondary | ICD-10-CM | POA: Diagnosis not present

## 2012-05-20 DIAGNOSIS — J309 Allergic rhinitis, unspecified: Secondary | ICD-10-CM | POA: Diagnosis not present

## 2012-05-27 DIAGNOSIS — J309 Allergic rhinitis, unspecified: Secondary | ICD-10-CM | POA: Diagnosis not present

## 2012-05-31 DIAGNOSIS — J301 Allergic rhinitis due to pollen: Secondary | ICD-10-CM | POA: Diagnosis not present

## 2012-05-31 DIAGNOSIS — J3089 Other allergic rhinitis: Secondary | ICD-10-CM | POA: Diagnosis not present

## 2012-05-31 DIAGNOSIS — H1045 Other chronic allergic conjunctivitis: Secondary | ICD-10-CM | POA: Diagnosis not present

## 2012-06-03 DIAGNOSIS — J309 Allergic rhinitis, unspecified: Secondary | ICD-10-CM | POA: Diagnosis not present

## 2012-06-10 DIAGNOSIS — J309 Allergic rhinitis, unspecified: Secondary | ICD-10-CM | POA: Diagnosis not present

## 2012-06-17 DIAGNOSIS — J309 Allergic rhinitis, unspecified: Secondary | ICD-10-CM | POA: Diagnosis not present

## 2012-06-19 DIAGNOSIS — E039 Hypothyroidism, unspecified: Secondary | ICD-10-CM | POA: Diagnosis not present

## 2012-06-19 DIAGNOSIS — L989 Disorder of the skin and subcutaneous tissue, unspecified: Secondary | ICD-10-CM | POA: Diagnosis not present

## 2012-06-19 DIAGNOSIS — E119 Type 2 diabetes mellitus without complications: Secondary | ICD-10-CM | POA: Diagnosis not present

## 2012-06-19 DIAGNOSIS — I10 Essential (primary) hypertension: Secondary | ICD-10-CM | POA: Diagnosis not present

## 2012-06-24 DIAGNOSIS — J309 Allergic rhinitis, unspecified: Secondary | ICD-10-CM | POA: Diagnosis not present

## 2012-06-26 DIAGNOSIS — L570 Actinic keratosis: Secondary | ICD-10-CM | POA: Diagnosis not present

## 2012-06-27 DIAGNOSIS — M48061 Spinal stenosis, lumbar region without neurogenic claudication: Secondary | ICD-10-CM | POA: Diagnosis not present

## 2012-06-28 DIAGNOSIS — J309 Allergic rhinitis, unspecified: Secondary | ICD-10-CM | POA: Diagnosis not present

## 2012-07-01 DIAGNOSIS — J309 Allergic rhinitis, unspecified: Secondary | ICD-10-CM | POA: Diagnosis not present

## 2012-07-09 DIAGNOSIS — J309 Allergic rhinitis, unspecified: Secondary | ICD-10-CM | POA: Diagnosis not present

## 2012-07-15 DIAGNOSIS — J309 Allergic rhinitis, unspecified: Secondary | ICD-10-CM | POA: Diagnosis not present

## 2012-07-22 DIAGNOSIS — J309 Allergic rhinitis, unspecified: Secondary | ICD-10-CM | POA: Diagnosis not present

## 2012-07-30 DIAGNOSIS — J309 Allergic rhinitis, unspecified: Secondary | ICD-10-CM | POA: Diagnosis not present

## 2012-08-05 DIAGNOSIS — J309 Allergic rhinitis, unspecified: Secondary | ICD-10-CM | POA: Diagnosis not present

## 2012-08-12 DIAGNOSIS — J309 Allergic rhinitis, unspecified: Secondary | ICD-10-CM | POA: Diagnosis not present

## 2012-08-19 DIAGNOSIS — J309 Allergic rhinitis, unspecified: Secondary | ICD-10-CM | POA: Diagnosis not present

## 2012-08-26 DIAGNOSIS — J309 Allergic rhinitis, unspecified: Secondary | ICD-10-CM | POA: Diagnosis not present

## 2012-08-29 DIAGNOSIS — J309 Allergic rhinitis, unspecified: Secondary | ICD-10-CM | POA: Diagnosis not present

## 2012-09-02 DIAGNOSIS — J309 Allergic rhinitis, unspecified: Secondary | ICD-10-CM | POA: Diagnosis not present

## 2012-09-10 DIAGNOSIS — J309 Allergic rhinitis, unspecified: Secondary | ICD-10-CM | POA: Diagnosis not present

## 2012-09-17 DIAGNOSIS — J309 Allergic rhinitis, unspecified: Secondary | ICD-10-CM | POA: Diagnosis not present

## 2012-09-23 DIAGNOSIS — J309 Allergic rhinitis, unspecified: Secondary | ICD-10-CM | POA: Diagnosis not present

## 2012-09-30 DIAGNOSIS — J309 Allergic rhinitis, unspecified: Secondary | ICD-10-CM | POA: Diagnosis not present

## 2012-10-07 DIAGNOSIS — J309 Allergic rhinitis, unspecified: Secondary | ICD-10-CM | POA: Diagnosis not present

## 2012-10-09 DIAGNOSIS — J309 Allergic rhinitis, unspecified: Secondary | ICD-10-CM | POA: Diagnosis not present

## 2012-10-14 DIAGNOSIS — J309 Allergic rhinitis, unspecified: Secondary | ICD-10-CM | POA: Diagnosis not present

## 2012-10-21 DIAGNOSIS — J309 Allergic rhinitis, unspecified: Secondary | ICD-10-CM | POA: Diagnosis not present

## 2012-10-25 DIAGNOSIS — E785 Hyperlipidemia, unspecified: Secondary | ICD-10-CM | POA: Diagnosis not present

## 2012-10-25 DIAGNOSIS — E119 Type 2 diabetes mellitus without complications: Secondary | ICD-10-CM | POA: Diagnosis not present

## 2012-10-25 DIAGNOSIS — G4736 Sleep related hypoventilation in conditions classified elsewhere: Secondary | ICD-10-CM | POA: Diagnosis not present

## 2012-10-25 DIAGNOSIS — M25529 Pain in unspecified elbow: Secondary | ICD-10-CM | POA: Diagnosis not present

## 2012-10-28 DIAGNOSIS — J309 Allergic rhinitis, unspecified: Secondary | ICD-10-CM | POA: Diagnosis not present

## 2012-11-04 DIAGNOSIS — J309 Allergic rhinitis, unspecified: Secondary | ICD-10-CM | POA: Diagnosis not present

## 2012-11-11 DIAGNOSIS — J309 Allergic rhinitis, unspecified: Secondary | ICD-10-CM | POA: Diagnosis not present

## 2012-11-18 DIAGNOSIS — J309 Allergic rhinitis, unspecified: Secondary | ICD-10-CM | POA: Diagnosis not present

## 2012-11-25 DIAGNOSIS — J309 Allergic rhinitis, unspecified: Secondary | ICD-10-CM | POA: Diagnosis not present

## 2012-12-02 DIAGNOSIS — J309 Allergic rhinitis, unspecified: Secondary | ICD-10-CM | POA: Diagnosis not present

## 2012-12-09 DIAGNOSIS — J309 Allergic rhinitis, unspecified: Secondary | ICD-10-CM | POA: Diagnosis not present

## 2012-12-16 DIAGNOSIS — M545 Low back pain, unspecified: Secondary | ICD-10-CM | POA: Diagnosis not present

## 2012-12-16 DIAGNOSIS — M5137 Other intervertebral disc degeneration, lumbosacral region: Secondary | ICD-10-CM | POA: Diagnosis not present

## 2012-12-16 DIAGNOSIS — IMO0002 Reserved for concepts with insufficient information to code with codable children: Secondary | ICD-10-CM | POA: Diagnosis not present

## 2012-12-18 DIAGNOSIS — J309 Allergic rhinitis, unspecified: Secondary | ICD-10-CM | POA: Diagnosis not present

## 2012-12-23 DIAGNOSIS — J309 Allergic rhinitis, unspecified: Secondary | ICD-10-CM | POA: Diagnosis not present

## 2012-12-30 DIAGNOSIS — J309 Allergic rhinitis, unspecified: Secondary | ICD-10-CM | POA: Diagnosis not present

## 2013-01-06 DIAGNOSIS — J309 Allergic rhinitis, unspecified: Secondary | ICD-10-CM | POA: Diagnosis not present

## 2013-01-13 DIAGNOSIS — J309 Allergic rhinitis, unspecified: Secondary | ICD-10-CM | POA: Diagnosis not present

## 2013-01-14 DIAGNOSIS — M545 Low back pain, unspecified: Secondary | ICD-10-CM | POA: Diagnosis not present

## 2013-01-14 DIAGNOSIS — M47817 Spondylosis without myelopathy or radiculopathy, lumbosacral region: Secondary | ICD-10-CM | POA: Diagnosis not present

## 2013-01-14 DIAGNOSIS — IMO0002 Reserved for concepts with insufficient information to code with codable children: Secondary | ICD-10-CM | POA: Diagnosis not present

## 2013-01-21 DIAGNOSIS — J309 Allergic rhinitis, unspecified: Secondary | ICD-10-CM | POA: Diagnosis not present

## 2013-01-22 DIAGNOSIS — M545 Low back pain: Secondary | ICD-10-CM | POA: Diagnosis not present

## 2013-01-27 DIAGNOSIS — J309 Allergic rhinitis, unspecified: Secondary | ICD-10-CM | POA: Diagnosis not present

## 2013-02-03 DIAGNOSIS — J309 Allergic rhinitis, unspecified: Secondary | ICD-10-CM | POA: Diagnosis not present

## 2013-02-04 DIAGNOSIS — J309 Allergic rhinitis, unspecified: Secondary | ICD-10-CM | POA: Diagnosis not present

## 2013-02-10 DIAGNOSIS — J309 Allergic rhinitis, unspecified: Secondary | ICD-10-CM | POA: Diagnosis not present

## 2013-02-11 DIAGNOSIS — M545 Low back pain, unspecified: Secondary | ICD-10-CM | POA: Diagnosis not present

## 2013-02-17 DIAGNOSIS — J309 Allergic rhinitis, unspecified: Secondary | ICD-10-CM | POA: Diagnosis not present

## 2013-03-03 DIAGNOSIS — J309 Allergic rhinitis, unspecified: Secondary | ICD-10-CM | POA: Diagnosis not present

## 2013-03-10 DIAGNOSIS — J309 Allergic rhinitis, unspecified: Secondary | ICD-10-CM | POA: Diagnosis not present

## 2013-03-11 DIAGNOSIS — H902 Conductive hearing loss, unspecified: Secondary | ICD-10-CM | POA: Diagnosis not present

## 2013-03-11 DIAGNOSIS — H60399 Other infective otitis externa, unspecified ear: Secondary | ICD-10-CM | POA: Diagnosis not present

## 2013-03-11 DIAGNOSIS — H6120 Impacted cerumen, unspecified ear: Secondary | ICD-10-CM | POA: Diagnosis not present

## 2013-03-17 DIAGNOSIS — J309 Allergic rhinitis, unspecified: Secondary | ICD-10-CM | POA: Diagnosis not present

## 2013-03-20 DIAGNOSIS — N318 Other neuromuscular dysfunction of bladder: Secondary | ICD-10-CM | POA: Diagnosis not present

## 2013-03-20 DIAGNOSIS — Z6832 Body mass index (BMI) 32.0-32.9, adult: Secondary | ICD-10-CM | POA: Diagnosis not present

## 2013-03-20 DIAGNOSIS — E1169 Type 2 diabetes mellitus with other specified complication: Secondary | ICD-10-CM | POA: Diagnosis not present

## 2013-03-20 DIAGNOSIS — M1990 Unspecified osteoarthritis, unspecified site: Secondary | ICD-10-CM | POA: Diagnosis not present

## 2013-03-20 DIAGNOSIS — E039 Hypothyroidism, unspecified: Secondary | ICD-10-CM | POA: Diagnosis not present

## 2013-03-20 DIAGNOSIS — G4736 Sleep related hypoventilation in conditions classified elsewhere: Secondary | ICD-10-CM | POA: Diagnosis not present

## 2013-03-20 DIAGNOSIS — I10 Essential (primary) hypertension: Secondary | ICD-10-CM | POA: Diagnosis not present

## 2013-03-24 DIAGNOSIS — J309 Allergic rhinitis, unspecified: Secondary | ICD-10-CM | POA: Diagnosis not present

## 2013-03-31 DIAGNOSIS — J309 Allergic rhinitis, unspecified: Secondary | ICD-10-CM | POA: Diagnosis not present

## 2013-04-02 DIAGNOSIS — Z961 Presence of intraocular lens: Secondary | ICD-10-CM | POA: Diagnosis not present

## 2013-04-02 DIAGNOSIS — H04129 Dry eye syndrome of unspecified lacrimal gland: Secondary | ICD-10-CM | POA: Diagnosis not present

## 2013-04-02 DIAGNOSIS — H40059 Ocular hypertension, unspecified eye: Secondary | ICD-10-CM | POA: Diagnosis not present

## 2013-04-02 DIAGNOSIS — H1045 Other chronic allergic conjunctivitis: Secondary | ICD-10-CM | POA: Diagnosis not present

## 2013-04-02 DIAGNOSIS — H353190 Nonexudative age-related macular degeneration, unspecified eye, stage unspecified: Secondary | ICD-10-CM | POA: Diagnosis not present

## 2013-04-02 DIAGNOSIS — E119 Type 2 diabetes mellitus without complications: Secondary | ICD-10-CM | POA: Diagnosis not present

## 2013-04-07 DIAGNOSIS — J309 Allergic rhinitis, unspecified: Secondary | ICD-10-CM | POA: Diagnosis not present

## 2013-04-10 DIAGNOSIS — H60399 Other infective otitis externa, unspecified ear: Secondary | ICD-10-CM | POA: Diagnosis not present

## 2013-04-14 DIAGNOSIS — J309 Allergic rhinitis, unspecified: Secondary | ICD-10-CM | POA: Diagnosis not present

## 2013-04-17 DIAGNOSIS — J309 Allergic rhinitis, unspecified: Secondary | ICD-10-CM | POA: Diagnosis not present

## 2013-04-21 DIAGNOSIS — J309 Allergic rhinitis, unspecified: Secondary | ICD-10-CM | POA: Diagnosis not present

## 2013-04-29 DIAGNOSIS — J309 Allergic rhinitis, unspecified: Secondary | ICD-10-CM | POA: Diagnosis not present

## 2013-04-29 DIAGNOSIS — H93299 Other abnormal auditory perceptions, unspecified ear: Secondary | ICD-10-CM | POA: Diagnosis not present

## 2013-04-29 DIAGNOSIS — H903 Sensorineural hearing loss, bilateral: Secondary | ICD-10-CM | POA: Diagnosis not present

## 2013-04-29 DIAGNOSIS — H60399 Other infective otitis externa, unspecified ear: Secondary | ICD-10-CM | POA: Diagnosis not present

## 2013-05-05 DIAGNOSIS — J309 Allergic rhinitis, unspecified: Secondary | ICD-10-CM | POA: Diagnosis not present

## 2013-05-13 DIAGNOSIS — J309 Allergic rhinitis, unspecified: Secondary | ICD-10-CM | POA: Diagnosis not present

## 2013-05-16 DIAGNOSIS — J309 Allergic rhinitis, unspecified: Secondary | ICD-10-CM | POA: Diagnosis not present

## 2013-05-19 DIAGNOSIS — J309 Allergic rhinitis, unspecified: Secondary | ICD-10-CM | POA: Diagnosis not present

## 2013-05-26 DIAGNOSIS — J309 Allergic rhinitis, unspecified: Secondary | ICD-10-CM | POA: Diagnosis not present

## 2013-05-30 DIAGNOSIS — J301 Allergic rhinitis due to pollen: Secondary | ICD-10-CM | POA: Diagnosis not present

## 2013-05-30 DIAGNOSIS — H1045 Other chronic allergic conjunctivitis: Secondary | ICD-10-CM | POA: Diagnosis not present

## 2013-05-30 DIAGNOSIS — J3089 Other allergic rhinitis: Secondary | ICD-10-CM | POA: Diagnosis not present

## 2013-06-02 DIAGNOSIS — J309 Allergic rhinitis, unspecified: Secondary | ICD-10-CM | POA: Diagnosis not present

## 2013-06-09 DIAGNOSIS — J309 Allergic rhinitis, unspecified: Secondary | ICD-10-CM | POA: Diagnosis not present

## 2013-06-16 DIAGNOSIS — J309 Allergic rhinitis, unspecified: Secondary | ICD-10-CM | POA: Diagnosis not present

## 2013-06-23 DIAGNOSIS — J309 Allergic rhinitis, unspecified: Secondary | ICD-10-CM | POA: Diagnosis not present

## 2013-06-30 DIAGNOSIS — J309 Allergic rhinitis, unspecified: Secondary | ICD-10-CM | POA: Diagnosis not present

## 2013-07-07 DIAGNOSIS — J309 Allergic rhinitis, unspecified: Secondary | ICD-10-CM | POA: Diagnosis not present

## 2013-07-14 DIAGNOSIS — J309 Allergic rhinitis, unspecified: Secondary | ICD-10-CM | POA: Diagnosis not present

## 2013-07-21 DIAGNOSIS — M1990 Unspecified osteoarthritis, unspecified site: Secondary | ICD-10-CM | POA: Diagnosis not present

## 2013-07-21 DIAGNOSIS — E1169 Type 2 diabetes mellitus with other specified complication: Secondary | ICD-10-CM | POA: Diagnosis not present

## 2013-07-21 DIAGNOSIS — D509 Iron deficiency anemia, unspecified: Secondary | ICD-10-CM | POA: Diagnosis not present

## 2013-07-21 DIAGNOSIS — E785 Hyperlipidemia, unspecified: Secondary | ICD-10-CM | POA: Diagnosis not present

## 2013-07-21 DIAGNOSIS — I10 Essential (primary) hypertension: Secondary | ICD-10-CM | POA: Diagnosis not present

## 2013-07-21 DIAGNOSIS — Z6833 Body mass index (BMI) 33.0-33.9, adult: Secondary | ICD-10-CM | POA: Diagnosis not present

## 2013-07-21 DIAGNOSIS — J309 Allergic rhinitis, unspecified: Secondary | ICD-10-CM | POA: Diagnosis not present

## 2013-07-21 DIAGNOSIS — G4736 Sleep related hypoventilation in conditions classified elsewhere: Secondary | ICD-10-CM | POA: Diagnosis not present

## 2013-07-21 DIAGNOSIS — E039 Hypothyroidism, unspecified: Secondary | ICD-10-CM | POA: Diagnosis not present

## 2013-07-28 DIAGNOSIS — J309 Allergic rhinitis, unspecified: Secondary | ICD-10-CM | POA: Diagnosis not present

## 2013-07-30 DIAGNOSIS — J309 Allergic rhinitis, unspecified: Secondary | ICD-10-CM | POA: Diagnosis not present

## 2013-08-04 DIAGNOSIS — J309 Allergic rhinitis, unspecified: Secondary | ICD-10-CM | POA: Diagnosis not present

## 2013-08-11 DIAGNOSIS — J309 Allergic rhinitis, unspecified: Secondary | ICD-10-CM | POA: Diagnosis not present

## 2013-08-18 DIAGNOSIS — J309 Allergic rhinitis, unspecified: Secondary | ICD-10-CM | POA: Diagnosis not present

## 2013-08-25 DIAGNOSIS — J309 Allergic rhinitis, unspecified: Secondary | ICD-10-CM | POA: Diagnosis not present

## 2013-09-01 DIAGNOSIS — J309 Allergic rhinitis, unspecified: Secondary | ICD-10-CM | POA: Diagnosis not present

## 2013-09-08 DIAGNOSIS — J309 Allergic rhinitis, unspecified: Secondary | ICD-10-CM | POA: Diagnosis not present

## 2013-09-12 DIAGNOSIS — J309 Allergic rhinitis, unspecified: Secondary | ICD-10-CM | POA: Diagnosis not present

## 2013-09-15 DIAGNOSIS — J309 Allergic rhinitis, unspecified: Secondary | ICD-10-CM | POA: Diagnosis not present

## 2013-09-22 DIAGNOSIS — J309 Allergic rhinitis, unspecified: Secondary | ICD-10-CM | POA: Diagnosis not present

## 2013-09-29 DIAGNOSIS — J309 Allergic rhinitis, unspecified: Secondary | ICD-10-CM | POA: Diagnosis not present

## 2013-10-06 DIAGNOSIS — J309 Allergic rhinitis, unspecified: Secondary | ICD-10-CM | POA: Diagnosis not present

## 2013-10-13 DIAGNOSIS — J309 Allergic rhinitis, unspecified: Secondary | ICD-10-CM | POA: Diagnosis not present

## 2013-10-20 DIAGNOSIS — J309 Allergic rhinitis, unspecified: Secondary | ICD-10-CM | POA: Diagnosis not present

## 2013-10-27 DIAGNOSIS — J309 Allergic rhinitis, unspecified: Secondary | ICD-10-CM | POA: Diagnosis not present

## 2013-11-03 DIAGNOSIS — J309 Allergic rhinitis, unspecified: Secondary | ICD-10-CM | POA: Diagnosis not present

## 2013-11-06 DIAGNOSIS — J309 Allergic rhinitis, unspecified: Secondary | ICD-10-CM | POA: Diagnosis not present

## 2013-11-10 DIAGNOSIS — J309 Allergic rhinitis, unspecified: Secondary | ICD-10-CM | POA: Diagnosis not present

## 2013-11-17 DIAGNOSIS — J309 Allergic rhinitis, unspecified: Secondary | ICD-10-CM | POA: Diagnosis not present

## 2013-11-24 DIAGNOSIS — J309 Allergic rhinitis, unspecified: Secondary | ICD-10-CM | POA: Diagnosis not present

## 2013-11-25 DIAGNOSIS — N183 Chronic kidney disease, stage 3 unspecified: Secondary | ICD-10-CM | POA: Diagnosis not present

## 2013-11-25 DIAGNOSIS — G4736 Sleep related hypoventilation in conditions classified elsewhere: Secondary | ICD-10-CM | POA: Diagnosis not present

## 2013-11-25 DIAGNOSIS — E1129 Type 2 diabetes mellitus with other diabetic kidney complication: Secondary | ICD-10-CM | POA: Diagnosis not present

## 2013-11-25 DIAGNOSIS — I10 Essential (primary) hypertension: Secondary | ICD-10-CM | POA: Diagnosis not present

## 2013-11-25 DIAGNOSIS — Z6833 Body mass index (BMI) 33.0-33.9, adult: Secondary | ICD-10-CM | POA: Diagnosis not present

## 2013-11-25 DIAGNOSIS — E039 Hypothyroidism, unspecified: Secondary | ICD-10-CM | POA: Diagnosis not present

## 2013-11-25 DIAGNOSIS — E785 Hyperlipidemia, unspecified: Secondary | ICD-10-CM | POA: Diagnosis not present

## 2013-12-01 DIAGNOSIS — J309 Allergic rhinitis, unspecified: Secondary | ICD-10-CM | POA: Diagnosis not present

## 2013-12-08 DIAGNOSIS — J309 Allergic rhinitis, unspecified: Secondary | ICD-10-CM | POA: Diagnosis not present

## 2013-12-10 DIAGNOSIS — J309 Allergic rhinitis, unspecified: Secondary | ICD-10-CM | POA: Diagnosis not present

## 2013-12-15 DIAGNOSIS — J309 Allergic rhinitis, unspecified: Secondary | ICD-10-CM | POA: Diagnosis not present

## 2013-12-22 DIAGNOSIS — J309 Allergic rhinitis, unspecified: Secondary | ICD-10-CM | POA: Diagnosis not present

## 2013-12-29 DIAGNOSIS — J309 Allergic rhinitis, unspecified: Secondary | ICD-10-CM | POA: Diagnosis not present

## 2014-01-05 DIAGNOSIS — J309 Allergic rhinitis, unspecified: Secondary | ICD-10-CM | POA: Diagnosis not present

## 2014-01-12 DIAGNOSIS — J309 Allergic rhinitis, unspecified: Secondary | ICD-10-CM | POA: Diagnosis not present

## 2014-01-20 DIAGNOSIS — J309 Allergic rhinitis, unspecified: Secondary | ICD-10-CM | POA: Diagnosis not present

## 2014-01-26 DIAGNOSIS — J309 Allergic rhinitis, unspecified: Secondary | ICD-10-CM | POA: Diagnosis not present

## 2014-02-02 DIAGNOSIS — J309 Allergic rhinitis, unspecified: Secondary | ICD-10-CM | POA: Diagnosis not present

## 2014-02-09 DIAGNOSIS — J309 Allergic rhinitis, unspecified: Secondary | ICD-10-CM | POA: Diagnosis not present

## 2014-02-16 DIAGNOSIS — J309 Allergic rhinitis, unspecified: Secondary | ICD-10-CM | POA: Diagnosis not present

## 2014-02-24 DIAGNOSIS — J309 Allergic rhinitis, unspecified: Secondary | ICD-10-CM | POA: Diagnosis not present

## 2014-02-25 DIAGNOSIS — J309 Allergic rhinitis, unspecified: Secondary | ICD-10-CM | POA: Diagnosis not present

## 2014-03-02 DIAGNOSIS — J309 Allergic rhinitis, unspecified: Secondary | ICD-10-CM | POA: Diagnosis not present

## 2014-03-09 DIAGNOSIS — J309 Allergic rhinitis, unspecified: Secondary | ICD-10-CM | POA: Diagnosis not present

## 2014-03-16 DIAGNOSIS — J309 Allergic rhinitis, unspecified: Secondary | ICD-10-CM | POA: Diagnosis not present

## 2014-03-24 DIAGNOSIS — J309 Allergic rhinitis, unspecified: Secondary | ICD-10-CM | POA: Diagnosis not present

## 2014-03-30 DIAGNOSIS — J309 Allergic rhinitis, unspecified: Secondary | ICD-10-CM | POA: Diagnosis not present

## 2014-04-06 DIAGNOSIS — J309 Allergic rhinitis, unspecified: Secondary | ICD-10-CM | POA: Diagnosis not present

## 2014-04-07 DIAGNOSIS — G4736 Sleep related hypoventilation in conditions classified elsewhere: Secondary | ICD-10-CM | POA: Diagnosis not present

## 2014-04-07 DIAGNOSIS — E039 Hypothyroidism, unspecified: Secondary | ICD-10-CM | POA: Diagnosis not present

## 2014-04-07 DIAGNOSIS — Z1331 Encounter for screening for depression: Secondary | ICD-10-CM | POA: Diagnosis not present

## 2014-04-07 DIAGNOSIS — N183 Chronic kidney disease, stage 3 unspecified: Secondary | ICD-10-CM | POA: Diagnosis not present

## 2014-04-07 DIAGNOSIS — I10 Essential (primary) hypertension: Secondary | ICD-10-CM | POA: Diagnosis not present

## 2014-04-07 DIAGNOSIS — D509 Iron deficiency anemia, unspecified: Secondary | ICD-10-CM | POA: Diagnosis not present

## 2014-04-07 DIAGNOSIS — E785 Hyperlipidemia, unspecified: Secondary | ICD-10-CM | POA: Diagnosis not present

## 2014-04-07 DIAGNOSIS — M1990 Unspecified osteoarthritis, unspecified site: Secondary | ICD-10-CM | POA: Diagnosis not present

## 2014-04-07 DIAGNOSIS — E119 Type 2 diabetes mellitus without complications: Secondary | ICD-10-CM | POA: Diagnosis not present

## 2014-04-10 DIAGNOSIS — N183 Chronic kidney disease, stage 3 unspecified: Secondary | ICD-10-CM | POA: Diagnosis not present

## 2014-04-13 DIAGNOSIS — J309 Allergic rhinitis, unspecified: Secondary | ICD-10-CM | POA: Diagnosis not present

## 2014-04-20 DIAGNOSIS — J309 Allergic rhinitis, unspecified: Secondary | ICD-10-CM | POA: Diagnosis not present

## 2014-04-27 DIAGNOSIS — J309 Allergic rhinitis, unspecified: Secondary | ICD-10-CM | POA: Diagnosis not present

## 2014-04-28 DIAGNOSIS — M545 Low back pain, unspecified: Secondary | ICD-10-CM | POA: Diagnosis not present

## 2014-04-28 DIAGNOSIS — IMO0002 Reserved for concepts with insufficient information to code with codable children: Secondary | ICD-10-CM | POA: Diagnosis not present

## 2014-05-07 DIAGNOSIS — E119 Type 2 diabetes mellitus without complications: Secondary | ICD-10-CM | POA: Diagnosis not present

## 2014-05-07 DIAGNOSIS — H1045 Other chronic allergic conjunctivitis: Secondary | ICD-10-CM | POA: Diagnosis not present

## 2014-05-07 DIAGNOSIS — H40059 Ocular hypertension, unspecified eye: Secondary | ICD-10-CM | POA: Diagnosis not present

## 2014-05-07 DIAGNOSIS — Z961 Presence of intraocular lens: Secondary | ICD-10-CM | POA: Diagnosis not present

## 2014-05-07 DIAGNOSIS — H353190 Nonexudative age-related macular degeneration, unspecified eye, stage unspecified: Secondary | ICD-10-CM | POA: Diagnosis not present

## 2014-05-07 DIAGNOSIS — H04129 Dry eye syndrome of unspecified lacrimal gland: Secondary | ICD-10-CM | POA: Diagnosis not present

## 2014-05-11 DIAGNOSIS — J309 Allergic rhinitis, unspecified: Secondary | ICD-10-CM | POA: Diagnosis not present

## 2014-05-18 DIAGNOSIS — J309 Allergic rhinitis, unspecified: Secondary | ICD-10-CM | POA: Diagnosis not present

## 2014-05-19 DIAGNOSIS — J309 Allergic rhinitis, unspecified: Secondary | ICD-10-CM | POA: Diagnosis not present

## 2014-05-25 DIAGNOSIS — J309 Allergic rhinitis, unspecified: Secondary | ICD-10-CM | POA: Diagnosis not present

## 2014-05-26 DIAGNOSIS — Z6832 Body mass index (BMI) 32.0-32.9, adult: Secondary | ICD-10-CM | POA: Diagnosis not present

## 2014-05-26 DIAGNOSIS — M545 Low back pain, unspecified: Secondary | ICD-10-CM | POA: Diagnosis not present

## 2014-05-26 DIAGNOSIS — M47817 Spondylosis without myelopathy or radiculopathy, lumbosacral region: Secondary | ICD-10-CM | POA: Diagnosis not present

## 2014-05-26 DIAGNOSIS — IMO0002 Reserved for concepts with insufficient information to code with codable children: Secondary | ICD-10-CM | POA: Diagnosis not present

## 2014-05-29 DIAGNOSIS — J301 Allergic rhinitis due to pollen: Secondary | ICD-10-CM | POA: Diagnosis not present

## 2014-05-29 DIAGNOSIS — H1045 Other chronic allergic conjunctivitis: Secondary | ICD-10-CM | POA: Diagnosis not present

## 2014-05-29 DIAGNOSIS — J3089 Other allergic rhinitis: Secondary | ICD-10-CM | POA: Diagnosis not present

## 2014-06-02 DIAGNOSIS — J301 Allergic rhinitis due to pollen: Secondary | ICD-10-CM | POA: Diagnosis not present

## 2014-06-02 DIAGNOSIS — J3081 Allergic rhinitis due to animal (cat) (dog) hair and dander: Secondary | ICD-10-CM | POA: Diagnosis not present

## 2014-06-02 DIAGNOSIS — J3089 Other allergic rhinitis: Secondary | ICD-10-CM | POA: Diagnosis not present

## 2014-06-08 DIAGNOSIS — J3089 Other allergic rhinitis: Secondary | ICD-10-CM | POA: Diagnosis not present

## 2014-06-08 DIAGNOSIS — J3081 Allergic rhinitis due to animal (cat) (dog) hair and dander: Secondary | ICD-10-CM | POA: Diagnosis not present

## 2014-06-08 DIAGNOSIS — J301 Allergic rhinitis due to pollen: Secondary | ICD-10-CM | POA: Diagnosis not present

## 2014-06-15 DIAGNOSIS — J3081 Allergic rhinitis due to animal (cat) (dog) hair and dander: Secondary | ICD-10-CM | POA: Diagnosis not present

## 2014-06-15 DIAGNOSIS — M545 Low back pain: Secondary | ICD-10-CM | POA: Diagnosis not present

## 2014-06-15 DIAGNOSIS — J301 Allergic rhinitis due to pollen: Secondary | ICD-10-CM | POA: Diagnosis not present

## 2014-06-15 DIAGNOSIS — M4727 Other spondylosis with radiculopathy, lumbosacral region: Secondary | ICD-10-CM | POA: Diagnosis not present

## 2014-06-15 DIAGNOSIS — J3089 Other allergic rhinitis: Secondary | ICD-10-CM | POA: Diagnosis not present

## 2014-06-22 DIAGNOSIS — J3081 Allergic rhinitis due to animal (cat) (dog) hair and dander: Secondary | ICD-10-CM | POA: Diagnosis not present

## 2014-06-22 DIAGNOSIS — J3089 Other allergic rhinitis: Secondary | ICD-10-CM | POA: Diagnosis not present

## 2014-06-22 DIAGNOSIS — J301 Allergic rhinitis due to pollen: Secondary | ICD-10-CM | POA: Diagnosis not present

## 2014-06-29 DIAGNOSIS — J301 Allergic rhinitis due to pollen: Secondary | ICD-10-CM | POA: Diagnosis not present

## 2014-06-29 DIAGNOSIS — J3089 Other allergic rhinitis: Secondary | ICD-10-CM | POA: Diagnosis not present

## 2014-07-08 DIAGNOSIS — J301 Allergic rhinitis due to pollen: Secondary | ICD-10-CM | POA: Diagnosis not present

## 2014-07-08 DIAGNOSIS — J3081 Allergic rhinitis due to animal (cat) (dog) hair and dander: Secondary | ICD-10-CM | POA: Diagnosis not present

## 2014-07-08 DIAGNOSIS — J3089 Other allergic rhinitis: Secondary | ICD-10-CM | POA: Diagnosis not present

## 2014-07-13 DIAGNOSIS — J301 Allergic rhinitis due to pollen: Secondary | ICD-10-CM | POA: Diagnosis not present

## 2014-07-13 DIAGNOSIS — Z6833 Body mass index (BMI) 33.0-33.9, adult: Secondary | ICD-10-CM | POA: Diagnosis not present

## 2014-07-13 DIAGNOSIS — M4727 Other spondylosis with radiculopathy, lumbosacral region: Secondary | ICD-10-CM | POA: Diagnosis not present

## 2014-07-13 DIAGNOSIS — J3081 Allergic rhinitis due to animal (cat) (dog) hair and dander: Secondary | ICD-10-CM | POA: Diagnosis not present

## 2014-07-13 DIAGNOSIS — J3089 Other allergic rhinitis: Secondary | ICD-10-CM | POA: Diagnosis not present

## 2014-07-20 DIAGNOSIS — J3089 Other allergic rhinitis: Secondary | ICD-10-CM | POA: Diagnosis not present

## 2014-07-20 DIAGNOSIS — J3081 Allergic rhinitis due to animal (cat) (dog) hair and dander: Secondary | ICD-10-CM | POA: Diagnosis not present

## 2014-07-20 DIAGNOSIS — J301 Allergic rhinitis due to pollen: Secondary | ICD-10-CM | POA: Diagnosis not present

## 2014-07-27 DIAGNOSIS — J3081 Allergic rhinitis due to animal (cat) (dog) hair and dander: Secondary | ICD-10-CM | POA: Diagnosis not present

## 2014-07-27 DIAGNOSIS — J301 Allergic rhinitis due to pollen: Secondary | ICD-10-CM | POA: Diagnosis not present

## 2014-07-27 DIAGNOSIS — J3089 Other allergic rhinitis: Secondary | ICD-10-CM | POA: Diagnosis not present

## 2014-08-03 DIAGNOSIS — J3089 Other allergic rhinitis: Secondary | ICD-10-CM | POA: Diagnosis not present

## 2014-08-03 DIAGNOSIS — J3081 Allergic rhinitis due to animal (cat) (dog) hair and dander: Secondary | ICD-10-CM | POA: Diagnosis not present

## 2014-08-03 DIAGNOSIS — J301 Allergic rhinitis due to pollen: Secondary | ICD-10-CM | POA: Diagnosis not present

## 2014-08-10 DIAGNOSIS — N183 Chronic kidney disease, stage 3 (moderate): Secondary | ICD-10-CM | POA: Diagnosis not present

## 2014-08-10 DIAGNOSIS — J301 Allergic rhinitis due to pollen: Secondary | ICD-10-CM | POA: Diagnosis not present

## 2014-08-10 DIAGNOSIS — J3089 Other allergic rhinitis: Secondary | ICD-10-CM | POA: Diagnosis not present

## 2014-08-10 DIAGNOSIS — I10 Essential (primary) hypertension: Secondary | ICD-10-CM | POA: Diagnosis not present

## 2014-08-10 DIAGNOSIS — Z6833 Body mass index (BMI) 33.0-33.9, adult: Secondary | ICD-10-CM | POA: Diagnosis not present

## 2014-08-10 DIAGNOSIS — E039 Hypothyroidism, unspecified: Secondary | ICD-10-CM | POA: Diagnosis not present

## 2014-08-10 DIAGNOSIS — E785 Hyperlipidemia, unspecified: Secondary | ICD-10-CM | POA: Diagnosis not present

## 2014-08-10 DIAGNOSIS — J3081 Allergic rhinitis due to animal (cat) (dog) hair and dander: Secondary | ICD-10-CM | POA: Diagnosis not present

## 2014-08-10 DIAGNOSIS — E119 Type 2 diabetes mellitus without complications: Secondary | ICD-10-CM | POA: Diagnosis not present

## 2014-08-10 DIAGNOSIS — G4736 Sleep related hypoventilation in conditions classified elsewhere: Secondary | ICD-10-CM | POA: Diagnosis not present

## 2014-08-17 DIAGNOSIS — J301 Allergic rhinitis due to pollen: Secondary | ICD-10-CM | POA: Diagnosis not present

## 2014-08-17 DIAGNOSIS — J3081 Allergic rhinitis due to animal (cat) (dog) hair and dander: Secondary | ICD-10-CM | POA: Diagnosis not present

## 2014-08-17 DIAGNOSIS — J3089 Other allergic rhinitis: Secondary | ICD-10-CM | POA: Diagnosis not present

## 2014-08-23 ENCOUNTER — Emergency Department (HOSPITAL_COMMUNITY)
Admission: EM | Admit: 2014-08-23 | Discharge: 2014-08-23 | Disposition: A | Discharge status: Home / Self Care | Payer: Medicare Other | Attending: Emergency Medicine | Admitting: Emergency Medicine

## 2014-08-23 ENCOUNTER — Encounter (HOSPITAL_COMMUNITY): Payer: Self-pay | Admitting: *Deleted

## 2014-08-23 DIAGNOSIS — S61216A Laceration without foreign body of right little finger without damage to nail, initial encounter: Secondary | ICD-10-CM | POA: Diagnosis not present

## 2014-08-23 DIAGNOSIS — Z79899 Other long term (current) drug therapy: Secondary | ICD-10-CM | POA: Insufficient documentation

## 2014-08-23 DIAGNOSIS — Y9289 Other specified places as the place of occurrence of the external cause: Secondary | ICD-10-CM | POA: Diagnosis not present

## 2014-08-23 DIAGNOSIS — E119 Type 2 diabetes mellitus without complications: Secondary | ICD-10-CM | POA: Insufficient documentation

## 2014-08-23 DIAGNOSIS — Y998 Other external cause status: Secondary | ICD-10-CM | POA: Diagnosis not present

## 2014-08-23 DIAGNOSIS — E039 Hypothyroidism, unspecified: Secondary | ICD-10-CM | POA: Insufficient documentation

## 2014-08-23 DIAGNOSIS — I10 Essential (primary) hypertension: Secondary | ICD-10-CM | POA: Diagnosis not present

## 2014-08-23 DIAGNOSIS — W1839XA Other fall on same level, initial encounter: Secondary | ICD-10-CM | POA: Diagnosis not present

## 2014-08-23 DIAGNOSIS — S61411A Laceration without foreign body of right hand, initial encounter: Secondary | ICD-10-CM

## 2014-08-23 DIAGNOSIS — Y288XXA Contact with other sharp object, undetermined intent, initial encounter: Secondary | ICD-10-CM | POA: Diagnosis not present

## 2014-08-23 DIAGNOSIS — S0011XA Contusion of right eyelid and periocular area, initial encounter: Secondary | ICD-10-CM | POA: Diagnosis not present

## 2014-08-23 DIAGNOSIS — Z23 Encounter for immunization: Secondary | ICD-10-CM | POA: Diagnosis not present

## 2014-08-23 DIAGNOSIS — Y9301 Activity, walking, marching and hiking: Secondary | ICD-10-CM | POA: Diagnosis not present

## 2014-08-23 DIAGNOSIS — E780 Pure hypercholesterolemia: Secondary | ICD-10-CM | POA: Insufficient documentation

## 2014-08-23 DIAGNOSIS — Z7982 Long term (current) use of aspirin: Secondary | ICD-10-CM | POA: Insufficient documentation

## 2014-08-23 HISTORY — DX: Type 2 diabetes mellitus without complications: E11.9

## 2014-08-23 HISTORY — DX: Pure hypercholesterolemia, unspecified: E78.00

## 2014-08-23 HISTORY — DX: Essential (primary) hypertension: I10

## 2014-08-23 HISTORY — DX: Hypothyroidism, unspecified: E03.9

## 2014-08-23 MED ORDER — BACITRACIN ZINC 500 UNIT/GM EX OINT
1.0000 "application " | TOPICAL_OINTMENT | Freq: Once | CUTANEOUS | Status: AC
  Administered 2014-08-23: 1 via TOPICAL

## 2014-08-23 MED ORDER — LIDOCAINE HCL (PF) 1 % IJ SOLN: 5.0000 mL | Freq: Once | INTRAMUSCULAR | Status: DC

## 2014-08-23 MED ORDER — TETANUS-DIPHTH-ACELL PERTUSSIS 5-2.5-18.5 LF-MCG/0.5 IM SUSP
0.5000 mL | Freq: Once | INTRAMUSCULAR | Status: AC
  Administered 2014-08-23: 0.5 mL via INTRAMUSCULAR
  Filled 2014-08-23: qty 0.5

## 2014-08-23 MED ORDER — LIDOCAINE HCL (PF) 1 % IJ SOLN
5.0000 mL | Freq: Once | INTRAMUSCULAR | Status: DC
  Filled 2014-08-23: qty 5

## 2014-08-23 NOTE — ED Notes (Signed)
Bed: [facility]ALD Expected date:  Expected time:  Means of arrival:  Comments: 

## 2014-08-23 NOTE — Discharge Instructions (Signed)

## 2014-08-23 NOTE — ED Notes (Signed)
Pt reports he was helping a friend who started to fall, made pt fall as well. Fell onto right side. Abrasion to right side of face, he believes is from glasses, right shoulder pain. Laceration to right pinkie proximal joint. Bleeding controlled. Pain 5/10. Pt denies headache, denies blurry vision or dizziness.

## 2014-08-23 NOTE — ED Provider Notes (Signed)
Patient seen and assessed by Dr Lynelle DoctorKnapp.  I was asked to repair hand laceration.  Full AROM of 5th digit, distal sensation intact, capillar refill < 2 seconds.   LACERATION REPAIR Performed by: Trixie DredgeWEST, Teddie Mehta Authorized by: Trixie DredgeWEST, Emali Heyward Consent: Verbal consent obtained. Risks and benefits: risks, benefits and alternatives were discussed Consent given by: patient Patient identity confirmed: provided demographic data Prepped and Draped in normal sterile fashion Wound explored  Laceration Location: right hand, palmar aspect 5th MCP  Laceration Length: 2cm  No Foreign Bodies seen or palpated  Anesthesia: local infiltration  Local anesthetic: lidocaine 1% no epinephrine  Anesthetic total: 4 ml  Irrigation method: syringe Amount of cleaning: standard  Skin closure: 4-0 ethilon  Number of sutures: 6  Technique: simple interrupted  Patient tolerance: Patient tolerated the procedure well with no immediate complications.   Discussed return precautions.    Trixie Dredgemily Yeraldine Forney, PA-C 08/23/14 [id number]

## 2014-08-23 NOTE — ED Provider Notes (Signed)
CSN: [id number]     Arrival date & time 08/23/14  1427 History   First MD Initiated Contact with Patient 08/23/14 1518     Chief Complaint  Patient presents with  . Fall  . hand laceration    HPI Comments: Pt was walking with an 80 year old lady. He was trying to help her when she started to fall and brought him down as well.  She fell onto his right hand and sustained a laceration.  Pt has been able to walk.  Denies other complaints.  Patient is a 80 y.o. male presenting with fall. The history is provided by the patient.  Fall This is a new problem. The current episode started less than 1 hour ago. Pertinent negatives include no chest pain, no abdominal pain, no headaches and no shortness of breath. Associated symptoms comments: No back pain, no numbness or weakness, no loss of consciousness.    Pt his his right hand on a sharp part of the ground and sustained a laceration.  Also bruised the right side of his face where his glasses are.. Nothing aggravates the symptoms. Nothing relieves the symptoms. He has tried a cold compress for the symptoms. The treatment provided moderate relief.    Past Medical History  Diagnosis Date  . Diabetes type 2, controlled   . Hypertension   . Hypothyroid   . High cholesterol    No past surgical history on file. History reviewed. No pertinent family history. History  Substance Use Topics  . Smoking status: Not on file  . Smokeless tobacco: Not on file  . Alcohol Use: Not on file    Review of Systems  Respiratory: Negative for shortness of breath.   Cardiovascular: Negative for chest pain.  Gastrointestinal: Negative for abdominal pain.  Neurological: Negative for headaches.  All other systems reviewed and are negative.     Allergies  Review of patient's allergies indicates no known allergies.  Home Medications   Prior to Admission medications   Medication Sig Start Date End Date Taking? Authorizing Provider  acetaminophen (ARTHRITIS  PAIN RELIEF) 650 MG CR tablet Take 1,300 mg by mouth 2 (two) times daily.   Yes Historical Provider, MD  aspirin 81 MG tablet Take 81 mg by mouth daily.   Yes Historical Provider, MD  caffeine (NO DOZ MAXIMUM STRENGTH) 200 MG TABS tablet Take 400 mg by mouth every morning.   Yes Historical Provider, MD  Coenzyme Q10-Fish Oil-Vit E (CO-Q 10 OMEGA-3 FISH OIL PO) Take 2 tablets by mouth daily.   Yes Historical Provider, MD  diphenhydramine-acetaminophen (TYLENOL PM) 25-500 MG TABS Take 2 tablets by mouth at bedtime as needed (sleep).   Yes Historical Provider, MD  docusate sodium (COLACE) 100 MG capsule Take 300 mg by mouth daily.   Yes Historical Provider, MD  flunisolide (NASALIDE) 25 MCG/ACT (0.025%) SOLN Place 2 sprays into the nose 2 (two) times daily as needed (allergies).   Yes Historical Provider, MD  Ginkgo Biloba 60 MG CAPS Take 3 capsules by mouth 2 (two) times daily.   Yes Historical Provider, MD  Homeopathic Products (LEG CRAMP RELIEF PO) Take 1 tablet by mouth daily as needed (leg cramps). Leg cramps/quinine   Yes Historical Provider, MD  KRILL OIL PO Take 500 mg by mouth daily.   Yes Historical Provider, MD  levothyroxine (SYNTHROID, LEVOTHROID) 75 MCG tablet Take 75 mcg by mouth daily before breakfast.   Yes Historical Provider, MD  loratadine (CLARITIN) 10 MG tablet Take 10  mg by mouth daily.   Yes Historical Provider, MD  losartan (COZAAR) 100 MG tablet Take 100 mg by mouth 2 (two) times daily.   Yes Historical Provider, MD  Lutein 20 MG CAPS Take 1 capsule by mouth 2 (two) times daily.   Yes Historical Provider, MD  Misc Natural Products (OSTEO BI-FLEX JOINT SHIELD PO) Take 1 tablet by mouth 2 (two) times daily.   Yes Historical Provider, MD  Multiple Vitamins-Minerals (CENTRUM SILVER PO) Take 1 tablet by mouth daily.   Yes Historical Provider, MD  omeprazole (PRILOSEC) 20 MG capsule Take 20 mg by mouth daily as needed (indigestion/heartburn).   Yes Historical Provider, MD   pyridOXINE (VITAMIN B-6) 100 MG tablet Take 100 mg by mouth daily.   Yes Historical Provider, MD  simvastatin (ZOCOR) 40 MG tablet Take 20 mg by mouth at bedtime.   Yes Historical Provider, MD  vitamin B-12 (CYANOCOBALAMIN) 1000 MCG tablet Take 1,000 mcg by mouth daily.   Yes Historical Provider, MD  vitamin C (ASCORBIC ACID) 500 MG tablet Take 500 mg by mouth daily.   Yes Historical Provider, MD  ferrous sulfate 325 (65 FE) MG tablet Take 325 mg by mouth daily with breakfast.    Historical Provider, MD   BP 169/63 mmHg  Pulse 74  Temp(Src) 98.1 F (36.7 C) (Oral)  Resp 18  Ht 5\' 5"  (1.651 m)  Wt 182 lb (82.555 kg)  BMI 30.29 kg/m2  SpO2 94% Physical Exam  Constitutional: He appears well-developed and well-nourished. No distress.  HENT:  Head: Normocephalic.  Right Ear: External ear normal.  Left Ear: External ear normal.  Small contusion lateral to right eye, EOMI  Eyes: Conjunctivae and EOM are normal. Right eye exhibits no discharge. Left eye exhibits no discharge. No scleral icterus.  Neck: Neck supple. No tracheal deviation present.  Cardiovascular: Normal rate, regular rhythm and intact distal pulses.   Pulmonary/Chest: Effort normal and breath sounds normal. No stridor. No respiratory distress. He has no wheezes. He has no rales.  Abdominal: Soft. Bowel sounds are normal. He exhibits no distension. There is no tenderness. There is no rebound and no guarding.  Musculoskeletal: He exhibits no edema or tenderness.  Entire spine non tender, no tenderness in extremities, laceration palmar aspect around MCP joint 5th finger, no tendon or vascular injury, n/v intact, full rom without ttp  Neurological: He is alert. He has normal strength. No cranial nerve deficit (no facial droop, extraocular movements intact, no slurred speech) or sensory deficit. He exhibits normal muscle tone. He displays no seizure activity. Coordination normal.  Skin: Skin is warm and dry. No rash noted.   Psychiatric: He has a normal mood and affect.  Nursing note and vitals reviewed.   ED Course  Procedures (including critical care time) [facility]  Excellent wound approximation.  Pt tolerated well.  MDM   Final diagnoses:  Laceration of hand, right, initial encounter    No LOC. Doubt serious head injury.   Laceration of hand was repaired.  No bony ttp and full rom.  Doubt fracture.  Will splint.  Dc home.  Suture removal in 10-14 days.    Linwood DibblesJon Zurie Platas, MD 08/23/14 [phone]

## 2014-08-25 DIAGNOSIS — J3081 Allergic rhinitis due to animal (cat) (dog) hair and dander: Secondary | ICD-10-CM | POA: Diagnosis not present

## 2014-08-25 DIAGNOSIS — J3089 Other allergic rhinitis: Secondary | ICD-10-CM | POA: Diagnosis not present

## 2014-08-25 DIAGNOSIS — J301 Allergic rhinitis due to pollen: Secondary | ICD-10-CM | POA: Diagnosis not present

## 2014-08-31 DIAGNOSIS — J301 Allergic rhinitis due to pollen: Secondary | ICD-10-CM | POA: Diagnosis not present

## 2014-08-31 DIAGNOSIS — J3089 Other allergic rhinitis: Secondary | ICD-10-CM | POA: Diagnosis not present

## 2014-08-31 DIAGNOSIS — J3081 Allergic rhinitis due to animal (cat) (dog) hair and dander: Secondary | ICD-10-CM | POA: Diagnosis not present

## 2014-09-03 DIAGNOSIS — I10 Essential (primary) hypertension: Secondary | ICD-10-CM | POA: Diagnosis not present

## 2014-09-03 DIAGNOSIS — S61411A Laceration without foreign body of right hand, initial encounter: Secondary | ICD-10-CM | POA: Diagnosis not present

## 2014-09-03 DIAGNOSIS — Z6833 Body mass index (BMI) 33.0-33.9, adult: Secondary | ICD-10-CM | POA: Diagnosis not present

## 2014-09-03 DIAGNOSIS — Z4802 Encounter for removal of sutures: Secondary | ICD-10-CM | POA: Diagnosis not present

## 2014-09-08 DIAGNOSIS — J3089 Other allergic rhinitis: Secondary | ICD-10-CM | POA: Diagnosis not present

## 2014-09-08 DIAGNOSIS — J301 Allergic rhinitis due to pollen: Secondary | ICD-10-CM | POA: Diagnosis not present

## 2014-09-08 DIAGNOSIS — J3081 Allergic rhinitis due to animal (cat) (dog) hair and dander: Secondary | ICD-10-CM | POA: Diagnosis not present

## 2014-09-14 DIAGNOSIS — J3081 Allergic rhinitis due to animal (cat) (dog) hair and dander: Secondary | ICD-10-CM | POA: Diagnosis not present

## 2014-09-14 DIAGNOSIS — J3089 Other allergic rhinitis: Secondary | ICD-10-CM | POA: Diagnosis not present

## 2014-09-14 DIAGNOSIS — J301 Allergic rhinitis due to pollen: Secondary | ICD-10-CM | POA: Diagnosis not present

## 2014-09-15 DIAGNOSIS — J301 Allergic rhinitis due to pollen: Secondary | ICD-10-CM | POA: Diagnosis not present

## 2014-09-21 DIAGNOSIS — J3081 Allergic rhinitis due to animal (cat) (dog) hair and dander: Secondary | ICD-10-CM | POA: Diagnosis not present

## 2014-09-21 DIAGNOSIS — J3089 Other allergic rhinitis: Secondary | ICD-10-CM | POA: Diagnosis not present

## 2014-09-21 DIAGNOSIS — J301 Allergic rhinitis due to pollen: Secondary | ICD-10-CM | POA: Diagnosis not present

## 2014-09-28 DIAGNOSIS — J3081 Allergic rhinitis due to animal (cat) (dog) hair and dander: Secondary | ICD-10-CM | POA: Diagnosis not present

## 2014-09-28 DIAGNOSIS — J3089 Other allergic rhinitis: Secondary | ICD-10-CM | POA: Diagnosis not present

## 2014-09-28 DIAGNOSIS — J301 Allergic rhinitis due to pollen: Secondary | ICD-10-CM | POA: Diagnosis not present

## 2014-10-02 DIAGNOSIS — J301 Allergic rhinitis due to pollen: Secondary | ICD-10-CM | POA: Diagnosis not present

## 2014-10-05 DIAGNOSIS — J301 Allergic rhinitis due to pollen: Secondary | ICD-10-CM | POA: Diagnosis not present

## 2014-10-05 DIAGNOSIS — J3089 Other allergic rhinitis: Secondary | ICD-10-CM | POA: Diagnosis not present

## 2014-10-05 DIAGNOSIS — J3081 Allergic rhinitis due to animal (cat) (dog) hair and dander: Secondary | ICD-10-CM | POA: Diagnosis not present

## 2014-10-13 DIAGNOSIS — J301 Allergic rhinitis due to pollen: Secondary | ICD-10-CM | POA: Diagnosis not present

## 2014-10-13 DIAGNOSIS — J3089 Other allergic rhinitis: Secondary | ICD-10-CM | POA: Diagnosis not present

## 2014-10-13 DIAGNOSIS — J3081 Allergic rhinitis due to animal (cat) (dog) hair and dander: Secondary | ICD-10-CM | POA: Diagnosis not present

## 2014-10-19 DIAGNOSIS — J3089 Other allergic rhinitis: Secondary | ICD-10-CM | POA: Diagnosis not present

## 2014-10-19 DIAGNOSIS — J3081 Allergic rhinitis due to animal (cat) (dog) hair and dander: Secondary | ICD-10-CM | POA: Diagnosis not present

## 2014-10-19 DIAGNOSIS — J301 Allergic rhinitis due to pollen: Secondary | ICD-10-CM | POA: Diagnosis not present

## 2014-10-23 DIAGNOSIS — J301 Allergic rhinitis due to pollen: Secondary | ICD-10-CM | POA: Diagnosis not present

## 2014-10-23 DIAGNOSIS — J3089 Other allergic rhinitis: Secondary | ICD-10-CM | POA: Diagnosis not present

## 2014-10-23 DIAGNOSIS — J3081 Allergic rhinitis due to animal (cat) (dog) hair and dander: Secondary | ICD-10-CM | POA: Diagnosis not present

## 2014-10-26 DIAGNOSIS — J3081 Allergic rhinitis due to animal (cat) (dog) hair and dander: Secondary | ICD-10-CM | POA: Diagnosis not present

## 2014-10-26 DIAGNOSIS — J3089 Other allergic rhinitis: Secondary | ICD-10-CM | POA: Diagnosis not present

## 2014-10-26 DIAGNOSIS — J301 Allergic rhinitis due to pollen: Secondary | ICD-10-CM | POA: Diagnosis not present

## 2014-11-02 DIAGNOSIS — J301 Allergic rhinitis due to pollen: Secondary | ICD-10-CM | POA: Diagnosis not present

## 2014-11-02 DIAGNOSIS — J3089 Other allergic rhinitis: Secondary | ICD-10-CM | POA: Diagnosis not present

## 2014-11-02 DIAGNOSIS — J3081 Allergic rhinitis due to animal (cat) (dog) hair and dander: Secondary | ICD-10-CM | POA: Diagnosis not present

## 2014-11-09 DIAGNOSIS — J3089 Other allergic rhinitis: Secondary | ICD-10-CM | POA: Diagnosis not present

## 2014-11-09 DIAGNOSIS — J301 Allergic rhinitis due to pollen: Secondary | ICD-10-CM | POA: Diagnosis not present

## 2014-11-09 DIAGNOSIS — J3081 Allergic rhinitis due to animal (cat) (dog) hair and dander: Secondary | ICD-10-CM | POA: Diagnosis not present

## 2014-11-16 DIAGNOSIS — J301 Allergic rhinitis due to pollen: Secondary | ICD-10-CM | POA: Diagnosis not present

## 2014-11-16 DIAGNOSIS — J3089 Other allergic rhinitis: Secondary | ICD-10-CM | POA: Diagnosis not present

## 2014-11-17 DIAGNOSIS — J209 Acute bronchitis, unspecified: Secondary | ICD-10-CM | POA: Diagnosis not present

## 2014-11-17 DIAGNOSIS — R05 Cough: Secondary | ICD-10-CM | POA: Diagnosis not present

## 2014-11-17 DIAGNOSIS — Z6833 Body mass index (BMI) 33.0-33.9, adult: Secondary | ICD-10-CM | POA: Diagnosis not present

## 2014-11-23 DIAGNOSIS — J3081 Allergic rhinitis due to animal (cat) (dog) hair and dander: Secondary | ICD-10-CM | POA: Diagnosis not present

## 2014-11-23 DIAGNOSIS — J301 Allergic rhinitis due to pollen: Secondary | ICD-10-CM | POA: Diagnosis not present

## 2014-11-23 DIAGNOSIS — J3089 Other allergic rhinitis: Secondary | ICD-10-CM | POA: Diagnosis not present

## 2014-11-24 DIAGNOSIS — J3081 Allergic rhinitis due to animal (cat) (dog) hair and dander: Secondary | ICD-10-CM | POA: Diagnosis not present

## 2014-11-24 DIAGNOSIS — J3089 Other allergic rhinitis: Secondary | ICD-10-CM | POA: Diagnosis not present

## 2014-11-30 DIAGNOSIS — J3089 Other allergic rhinitis: Secondary | ICD-10-CM | POA: Diagnosis not present

## 2014-11-30 DIAGNOSIS — J301 Allergic rhinitis due to pollen: Secondary | ICD-10-CM | POA: Diagnosis not present

## 2014-11-30 DIAGNOSIS — J3081 Allergic rhinitis due to animal (cat) (dog) hair and dander: Secondary | ICD-10-CM | POA: Diagnosis not present

## 2014-12-08 DIAGNOSIS — J301 Allergic rhinitis due to pollen: Secondary | ICD-10-CM | POA: Diagnosis not present

## 2014-12-08 DIAGNOSIS — J3089 Other allergic rhinitis: Secondary | ICD-10-CM | POA: Diagnosis not present

## 2014-12-08 DIAGNOSIS — J3081 Allergic rhinitis due to animal (cat) (dog) hair and dander: Secondary | ICD-10-CM | POA: Diagnosis not present

## 2014-12-10 DIAGNOSIS — Z1389 Encounter for screening for other disorder: Secondary | ICD-10-CM | POA: Diagnosis not present

## 2014-12-10 DIAGNOSIS — Z6832 Body mass index (BMI) 32.0-32.9, adult: Secondary | ICD-10-CM | POA: Diagnosis not present

## 2014-12-10 DIAGNOSIS — M1990 Unspecified osteoarthritis, unspecified site: Secondary | ICD-10-CM | POA: Diagnosis not present

## 2014-12-10 DIAGNOSIS — N183 Chronic kidney disease, stage 3 (moderate): Secondary | ICD-10-CM | POA: Diagnosis not present

## 2014-12-10 DIAGNOSIS — I10 Essential (primary) hypertension: Secondary | ICD-10-CM | POA: Diagnosis not present

## 2014-12-10 DIAGNOSIS — G4736 Sleep related hypoventilation in conditions classified elsewhere: Secondary | ICD-10-CM | POA: Diagnosis not present

## 2014-12-10 DIAGNOSIS — E1129 Type 2 diabetes mellitus with other diabetic kidney complication: Secondary | ICD-10-CM | POA: Diagnosis not present

## 2014-12-10 DIAGNOSIS — D509 Iron deficiency anemia, unspecified: Secondary | ICD-10-CM | POA: Diagnosis not present

## 2014-12-10 DIAGNOSIS — J302 Other seasonal allergic rhinitis: Secondary | ICD-10-CM | POA: Diagnosis not present

## 2014-12-10 DIAGNOSIS — E039 Hypothyroidism, unspecified: Secondary | ICD-10-CM | POA: Diagnosis not present

## 2014-12-14 DIAGNOSIS — J3081 Allergic rhinitis due to animal (cat) (dog) hair and dander: Secondary | ICD-10-CM | POA: Diagnosis not present

## 2014-12-14 DIAGNOSIS — J301 Allergic rhinitis due to pollen: Secondary | ICD-10-CM | POA: Diagnosis not present

## 2014-12-14 DIAGNOSIS — J3089 Other allergic rhinitis: Secondary | ICD-10-CM | POA: Diagnosis not present

## 2014-12-17 DIAGNOSIS — J3089 Other allergic rhinitis: Secondary | ICD-10-CM | POA: Diagnosis not present

## 2014-12-17 DIAGNOSIS — J301 Allergic rhinitis due to pollen: Secondary | ICD-10-CM | POA: Diagnosis not present

## 2014-12-22 DIAGNOSIS — J3081 Allergic rhinitis due to animal (cat) (dog) hair and dander: Secondary | ICD-10-CM | POA: Diagnosis not present

## 2014-12-22 DIAGNOSIS — J301 Allergic rhinitis due to pollen: Secondary | ICD-10-CM | POA: Diagnosis not present

## 2014-12-22 DIAGNOSIS — J3089 Other allergic rhinitis: Secondary | ICD-10-CM | POA: Diagnosis not present

## 2014-12-24 DIAGNOSIS — M4727 Other spondylosis with radiculopathy, lumbosacral region: Secondary | ICD-10-CM | POA: Diagnosis not present

## 2014-12-24 DIAGNOSIS — M545 Low back pain: Secondary | ICD-10-CM | POA: Diagnosis not present

## 2014-12-29 DIAGNOSIS — J3089 Other allergic rhinitis: Secondary | ICD-10-CM | POA: Diagnosis not present

## 2014-12-29 DIAGNOSIS — J301 Allergic rhinitis due to pollen: Secondary | ICD-10-CM | POA: Diagnosis not present

## 2015-01-04 DIAGNOSIS — J3089 Other allergic rhinitis: Secondary | ICD-10-CM | POA: Diagnosis not present

## 2015-01-04 DIAGNOSIS — J301 Allergic rhinitis due to pollen: Secondary | ICD-10-CM | POA: Diagnosis not present

## 2015-01-11 DIAGNOSIS — J301 Allergic rhinitis due to pollen: Secondary | ICD-10-CM | POA: Diagnosis not present

## 2015-01-11 DIAGNOSIS — J3089 Other allergic rhinitis: Secondary | ICD-10-CM | POA: Diagnosis not present

## 2015-01-11 DIAGNOSIS — J3081 Allergic rhinitis due to animal (cat) (dog) hair and dander: Secondary | ICD-10-CM | POA: Diagnosis not present

## 2015-01-18 DIAGNOSIS — J3081 Allergic rhinitis due to animal (cat) (dog) hair and dander: Secondary | ICD-10-CM | POA: Diagnosis not present

## 2015-01-18 DIAGNOSIS — J3089 Other allergic rhinitis: Secondary | ICD-10-CM | POA: Diagnosis not present

## 2015-01-18 DIAGNOSIS — J301 Allergic rhinitis due to pollen: Secondary | ICD-10-CM | POA: Diagnosis not present

## 2015-01-26 DIAGNOSIS — J3081 Allergic rhinitis due to animal (cat) (dog) hair and dander: Secondary | ICD-10-CM | POA: Diagnosis not present

## 2015-01-26 DIAGNOSIS — J3089 Other allergic rhinitis: Secondary | ICD-10-CM | POA: Diagnosis not present

## 2015-01-26 DIAGNOSIS — J301 Allergic rhinitis due to pollen: Secondary | ICD-10-CM | POA: Diagnosis not present

## 2015-02-01 DIAGNOSIS — J301 Allergic rhinitis due to pollen: Secondary | ICD-10-CM | POA: Diagnosis not present

## 2015-02-01 DIAGNOSIS — J3089 Other allergic rhinitis: Secondary | ICD-10-CM | POA: Diagnosis not present

## 2015-02-08 DIAGNOSIS — J301 Allergic rhinitis due to pollen: Secondary | ICD-10-CM | POA: Diagnosis not present

## 2015-02-08 DIAGNOSIS — J3089 Other allergic rhinitis: Secondary | ICD-10-CM | POA: Diagnosis not present

## 2015-02-09 DIAGNOSIS — J301 Allergic rhinitis due to pollen: Secondary | ICD-10-CM | POA: Diagnosis not present

## 2015-02-15 DIAGNOSIS — J3081 Allergic rhinitis due to animal (cat) (dog) hair and dander: Secondary | ICD-10-CM | POA: Diagnosis not present

## 2015-02-15 DIAGNOSIS — J3089 Other allergic rhinitis: Secondary | ICD-10-CM | POA: Diagnosis not present

## 2015-02-15 DIAGNOSIS — J301 Allergic rhinitis due to pollen: Secondary | ICD-10-CM | POA: Diagnosis not present

## 2015-02-22 DIAGNOSIS — J301 Allergic rhinitis due to pollen: Secondary | ICD-10-CM | POA: Diagnosis not present

## 2015-02-22 DIAGNOSIS — J3089 Other allergic rhinitis: Secondary | ICD-10-CM | POA: Diagnosis not present

## 2015-02-26 DIAGNOSIS — J301 Allergic rhinitis due to pollen: Secondary | ICD-10-CM | POA: Diagnosis not present

## 2015-02-26 DIAGNOSIS — J3089 Other allergic rhinitis: Secondary | ICD-10-CM | POA: Diagnosis not present

## 2015-02-26 DIAGNOSIS — J3081 Allergic rhinitis due to animal (cat) (dog) hair and dander: Secondary | ICD-10-CM | POA: Diagnosis not present

## 2015-03-02 DIAGNOSIS — J3089 Other allergic rhinitis: Secondary | ICD-10-CM | POA: Diagnosis not present

## 2015-03-02 DIAGNOSIS — J3081 Allergic rhinitis due to animal (cat) (dog) hair and dander: Secondary | ICD-10-CM | POA: Diagnosis not present

## 2015-03-02 DIAGNOSIS — J301 Allergic rhinitis due to pollen: Secondary | ICD-10-CM | POA: Diagnosis not present

## 2015-03-08 DIAGNOSIS — J301 Allergic rhinitis due to pollen: Secondary | ICD-10-CM | POA: Diagnosis not present

## 2015-03-08 DIAGNOSIS — J3089 Other allergic rhinitis: Secondary | ICD-10-CM | POA: Diagnosis not present

## 2015-03-15 DIAGNOSIS — J3081 Allergic rhinitis due to animal (cat) (dog) hair and dander: Secondary | ICD-10-CM | POA: Diagnosis not present

## 2015-03-15 DIAGNOSIS — J3089 Other allergic rhinitis: Secondary | ICD-10-CM | POA: Diagnosis not present

## 2015-03-15 DIAGNOSIS — J301 Allergic rhinitis due to pollen: Secondary | ICD-10-CM | POA: Diagnosis not present

## 2015-03-22 DIAGNOSIS — J301 Allergic rhinitis due to pollen: Secondary | ICD-10-CM | POA: Diagnosis not present

## 2015-03-22 DIAGNOSIS — J3089 Other allergic rhinitis: Secondary | ICD-10-CM | POA: Diagnosis not present

## 2015-03-22 DIAGNOSIS — J3081 Allergic rhinitis due to animal (cat) (dog) hair and dander: Secondary | ICD-10-CM | POA: Diagnosis not present

## 2015-03-29 DIAGNOSIS — J3089 Other allergic rhinitis: Secondary | ICD-10-CM | POA: Diagnosis not present

## 2015-03-29 DIAGNOSIS — J301 Allergic rhinitis due to pollen: Secondary | ICD-10-CM | POA: Diagnosis not present

## 2015-03-29 DIAGNOSIS — J3081 Allergic rhinitis due to animal (cat) (dog) hair and dander: Secondary | ICD-10-CM | POA: Diagnosis not present

## 2015-04-07 DIAGNOSIS — J3089 Other allergic rhinitis: Secondary | ICD-10-CM | POA: Diagnosis not present

## 2015-04-07 DIAGNOSIS — J301 Allergic rhinitis due to pollen: Secondary | ICD-10-CM | POA: Diagnosis not present

## 2015-04-07 DIAGNOSIS — J3081 Allergic rhinitis due to animal (cat) (dog) hair and dander: Secondary | ICD-10-CM | POA: Diagnosis not present

## 2015-04-12 DIAGNOSIS — J301 Allergic rhinitis due to pollen: Secondary | ICD-10-CM | POA: Diagnosis not present

## 2015-04-12 DIAGNOSIS — J3089 Other allergic rhinitis: Secondary | ICD-10-CM | POA: Diagnosis not present

## 2015-04-19 DIAGNOSIS — J3081 Allergic rhinitis due to animal (cat) (dog) hair and dander: Secondary | ICD-10-CM | POA: Diagnosis not present

## 2015-04-19 DIAGNOSIS — J301 Allergic rhinitis due to pollen: Secondary | ICD-10-CM | POA: Diagnosis not present

## 2015-04-19 DIAGNOSIS — J3089 Other allergic rhinitis: Secondary | ICD-10-CM | POA: Diagnosis not present

## 2015-04-20 DIAGNOSIS — J3089 Other allergic rhinitis: Secondary | ICD-10-CM | POA: Diagnosis not present

## 2015-04-20 DIAGNOSIS — J3081 Allergic rhinitis due to animal (cat) (dog) hair and dander: Secondary | ICD-10-CM | POA: Diagnosis not present

## 2015-04-26 DIAGNOSIS — J301 Allergic rhinitis due to pollen: Secondary | ICD-10-CM | POA: Diagnosis not present

## 2015-04-26 DIAGNOSIS — J3089 Other allergic rhinitis: Secondary | ICD-10-CM | POA: Diagnosis not present

## 2015-04-26 DIAGNOSIS — J3081 Allergic rhinitis due to animal (cat) (dog) hair and dander: Secondary | ICD-10-CM | POA: Diagnosis not present

## 2015-05-04 DIAGNOSIS — J3081 Allergic rhinitis due to animal (cat) (dog) hair and dander: Secondary | ICD-10-CM | POA: Diagnosis not present

## 2015-05-04 DIAGNOSIS — J3089 Other allergic rhinitis: Secondary | ICD-10-CM | POA: Diagnosis not present

## 2015-05-04 DIAGNOSIS — J301 Allergic rhinitis due to pollen: Secondary | ICD-10-CM | POA: Diagnosis not present

## 2015-05-06 DIAGNOSIS — E1129 Type 2 diabetes mellitus with other diabetic kidney complication: Secondary | ICD-10-CM | POA: Diagnosis not present

## 2015-05-06 DIAGNOSIS — N183 Chronic kidney disease, stage 3 (moderate): Secondary | ICD-10-CM | POA: Diagnosis not present

## 2015-05-06 DIAGNOSIS — I10 Essential (primary) hypertension: Secondary | ICD-10-CM | POA: Diagnosis not present

## 2015-05-06 DIAGNOSIS — G4736 Sleep related hypoventilation in conditions classified elsewhere: Secondary | ICD-10-CM | POA: Diagnosis not present

## 2015-05-06 DIAGNOSIS — E785 Hyperlipidemia, unspecified: Secondary | ICD-10-CM | POA: Diagnosis not present

## 2015-05-06 DIAGNOSIS — Z23 Encounter for immunization: Secondary | ICD-10-CM | POA: Diagnosis not present

## 2015-05-06 DIAGNOSIS — Z6833 Body mass index (BMI) 33.0-33.9, adult: Secondary | ICD-10-CM | POA: Diagnosis not present

## 2015-05-06 DIAGNOSIS — E039 Hypothyroidism, unspecified: Secondary | ICD-10-CM | POA: Diagnosis not present

## 2015-05-06 DIAGNOSIS — M79672 Pain in left foot: Secondary | ICD-10-CM | POA: Diagnosis not present

## 2015-05-10 DIAGNOSIS — J301 Allergic rhinitis due to pollen: Secondary | ICD-10-CM | POA: Diagnosis not present

## 2015-05-10 DIAGNOSIS — J3089 Other allergic rhinitis: Secondary | ICD-10-CM | POA: Diagnosis not present

## 2015-05-10 DIAGNOSIS — J3081 Allergic rhinitis due to animal (cat) (dog) hair and dander: Secondary | ICD-10-CM | POA: Diagnosis not present

## 2015-05-11 DIAGNOSIS — H3531 Nonexudative age-related macular degeneration: Secondary | ICD-10-CM | POA: Diagnosis not present

## 2015-05-11 DIAGNOSIS — H04123 Dry eye syndrome of bilateral lacrimal glands: Secondary | ICD-10-CM | POA: Diagnosis not present

## 2015-05-11 DIAGNOSIS — Z961 Presence of intraocular lens: Secondary | ICD-10-CM | POA: Diagnosis not present

## 2015-05-11 DIAGNOSIS — E119 Type 2 diabetes mellitus without complications: Secondary | ICD-10-CM | POA: Diagnosis not present

## 2015-05-11 DIAGNOSIS — H40013 Open angle with borderline findings, low risk, bilateral: Secondary | ICD-10-CM | POA: Diagnosis not present

## 2015-05-17 DIAGNOSIS — J301 Allergic rhinitis due to pollen: Secondary | ICD-10-CM | POA: Diagnosis not present

## 2015-05-17 DIAGNOSIS — J3081 Allergic rhinitis due to animal (cat) (dog) hair and dander: Secondary | ICD-10-CM | POA: Diagnosis not present

## 2015-05-17 DIAGNOSIS — J3089 Other allergic rhinitis: Secondary | ICD-10-CM | POA: Diagnosis not present

## 2015-05-24 ENCOUNTER — Encounter: Payer: Medicare Other | Admitting: Podiatry

## 2015-05-24 DIAGNOSIS — J3081 Allergic rhinitis due to animal (cat) (dog) hair and dander: Secondary | ICD-10-CM | POA: Diagnosis not present

## 2015-05-24 DIAGNOSIS — J301 Allergic rhinitis due to pollen: Secondary | ICD-10-CM | POA: Diagnosis not present

## 2015-05-24 DIAGNOSIS — J3089 Other allergic rhinitis: Secondary | ICD-10-CM | POA: Diagnosis not present

## 2015-05-28 NOTE — Progress Notes (Signed)
This encounter was created in error - please disregard.

## 2015-05-31 DIAGNOSIS — J3089 Other allergic rhinitis: Secondary | ICD-10-CM | POA: Diagnosis not present

## 2015-05-31 DIAGNOSIS — J301 Allergic rhinitis due to pollen: Secondary | ICD-10-CM | POA: Diagnosis not present

## 2015-05-31 DIAGNOSIS — J3081 Allergic rhinitis due to animal (cat) (dog) hair and dander: Secondary | ICD-10-CM | POA: Diagnosis not present

## 2015-06-07 DIAGNOSIS — J3089 Other allergic rhinitis: Secondary | ICD-10-CM | POA: Diagnosis not present

## 2015-06-07 DIAGNOSIS — J3081 Allergic rhinitis due to animal (cat) (dog) hair and dander: Secondary | ICD-10-CM | POA: Diagnosis not present

## 2015-06-07 DIAGNOSIS — J301 Allergic rhinitis due to pollen: Secondary | ICD-10-CM | POA: Diagnosis not present

## 2015-06-14 DIAGNOSIS — J3089 Other allergic rhinitis: Secondary | ICD-10-CM | POA: Diagnosis not present

## 2015-06-14 DIAGNOSIS — J301 Allergic rhinitis due to pollen: Secondary | ICD-10-CM | POA: Diagnosis not present

## 2015-06-14 DIAGNOSIS — J3081 Allergic rhinitis due to animal (cat) (dog) hair and dander: Secondary | ICD-10-CM | POA: Diagnosis not present

## 2015-06-21 DIAGNOSIS — J3089 Other allergic rhinitis: Secondary | ICD-10-CM | POA: Diagnosis not present

## 2015-06-21 DIAGNOSIS — J301 Allergic rhinitis due to pollen: Secondary | ICD-10-CM | POA: Diagnosis not present

## 2015-06-28 DIAGNOSIS — J3089 Other allergic rhinitis: Secondary | ICD-10-CM | POA: Diagnosis not present

## 2015-06-28 DIAGNOSIS — J301 Allergic rhinitis due to pollen: Secondary | ICD-10-CM | POA: Diagnosis not present

## 2015-06-28 DIAGNOSIS — J3081 Allergic rhinitis due to animal (cat) (dog) hair and dander: Secondary | ICD-10-CM | POA: Diagnosis not present

## 2015-07-06 DIAGNOSIS — J301 Allergic rhinitis due to pollen: Secondary | ICD-10-CM | POA: Diagnosis not present

## 2015-07-06 DIAGNOSIS — J3089 Other allergic rhinitis: Secondary | ICD-10-CM | POA: Diagnosis not present

## 2015-07-06 DIAGNOSIS — J3081 Allergic rhinitis due to animal (cat) (dog) hair and dander: Secondary | ICD-10-CM | POA: Diagnosis not present

## 2015-07-12 DIAGNOSIS — J3089 Other allergic rhinitis: Secondary | ICD-10-CM | POA: Diagnosis not present

## 2015-07-12 DIAGNOSIS — J301 Allergic rhinitis due to pollen: Secondary | ICD-10-CM | POA: Diagnosis not present

## 2015-07-12 DIAGNOSIS — J3081 Allergic rhinitis due to animal (cat) (dog) hair and dander: Secondary | ICD-10-CM | POA: Diagnosis not present

## 2015-07-21 DIAGNOSIS — J3089 Other allergic rhinitis: Secondary | ICD-10-CM | POA: Diagnosis not present

## 2015-07-21 DIAGNOSIS — J3081 Allergic rhinitis due to animal (cat) (dog) hair and dander: Secondary | ICD-10-CM | POA: Diagnosis not present

## 2015-07-21 DIAGNOSIS — J301 Allergic rhinitis due to pollen: Secondary | ICD-10-CM | POA: Diagnosis not present

## 2015-07-27 DIAGNOSIS — J301 Allergic rhinitis due to pollen: Secondary | ICD-10-CM | POA: Diagnosis not present

## 2015-07-27 DIAGNOSIS — H1045 Other chronic allergic conjunctivitis: Secondary | ICD-10-CM | POA: Diagnosis not present

## 2015-07-27 DIAGNOSIS — J3081 Allergic rhinitis due to animal (cat) (dog) hair and dander: Secondary | ICD-10-CM | POA: Diagnosis not present

## 2015-07-27 DIAGNOSIS — J3089 Other allergic rhinitis: Secondary | ICD-10-CM | POA: Diagnosis not present

## 2015-08-03 DIAGNOSIS — E119 Type 2 diabetes mellitus without complications: Secondary | ICD-10-CM | POA: Diagnosis not present

## 2015-08-03 DIAGNOSIS — Z6832 Body mass index (BMI) 32.0-32.9, adult: Secondary | ICD-10-CM | POA: Diagnosis not present

## 2015-08-03 DIAGNOSIS — R05 Cough: Secondary | ICD-10-CM | POA: Diagnosis not present

## 2015-08-03 DIAGNOSIS — R509 Fever, unspecified: Secondary | ICD-10-CM | POA: Diagnosis not present

## 2015-08-03 DIAGNOSIS — B349 Viral infection, unspecified: Secondary | ICD-10-CM | POA: Diagnosis not present

## 2015-08-03 DIAGNOSIS — J209 Acute bronchitis, unspecified: Secondary | ICD-10-CM | POA: Diagnosis not present

## 2015-08-03 DIAGNOSIS — J302 Other seasonal allergic rhinitis: Secondary | ICD-10-CM | POA: Diagnosis not present

## 2015-08-03 DIAGNOSIS — I10 Essential (primary) hypertension: Secondary | ICD-10-CM | POA: Diagnosis not present

## 2015-08-09 DIAGNOSIS — J3089 Other allergic rhinitis: Secondary | ICD-10-CM | POA: Diagnosis not present

## 2015-08-09 DIAGNOSIS — J3081 Allergic rhinitis due to animal (cat) (dog) hair and dander: Secondary | ICD-10-CM | POA: Diagnosis not present

## 2015-08-09 DIAGNOSIS — J301 Allergic rhinitis due to pollen: Secondary | ICD-10-CM | POA: Diagnosis not present

## 2015-08-13 DIAGNOSIS — J301 Allergic rhinitis due to pollen: Secondary | ICD-10-CM | POA: Diagnosis not present

## 2015-08-16 DIAGNOSIS — J3089 Other allergic rhinitis: Secondary | ICD-10-CM | POA: Diagnosis not present

## 2015-08-16 DIAGNOSIS — J301 Allergic rhinitis due to pollen: Secondary | ICD-10-CM | POA: Diagnosis not present

## 2015-08-16 DIAGNOSIS — J3081 Allergic rhinitis due to animal (cat) (dog) hair and dander: Secondary | ICD-10-CM | POA: Diagnosis not present

## 2015-08-25 DIAGNOSIS — J3081 Allergic rhinitis due to animal (cat) (dog) hair and dander: Secondary | ICD-10-CM | POA: Diagnosis not present

## 2015-08-25 DIAGNOSIS — J301 Allergic rhinitis due to pollen: Secondary | ICD-10-CM | POA: Diagnosis not present

## 2015-08-25 DIAGNOSIS — J3089 Other allergic rhinitis: Secondary | ICD-10-CM | POA: Diagnosis not present

## 2015-08-31 DIAGNOSIS — J301 Allergic rhinitis due to pollen: Secondary | ICD-10-CM | POA: Diagnosis not present

## 2015-08-31 DIAGNOSIS — J3089 Other allergic rhinitis: Secondary | ICD-10-CM | POA: Diagnosis not present

## 2015-08-31 DIAGNOSIS — J3081 Allergic rhinitis due to animal (cat) (dog) hair and dander: Secondary | ICD-10-CM | POA: Diagnosis not present

## 2015-09-07 DIAGNOSIS — E784 Other hyperlipidemia: Secondary | ICD-10-CM | POA: Diagnosis not present

## 2015-09-07 DIAGNOSIS — G4736 Sleep related hypoventilation in conditions classified elsewhere: Secondary | ICD-10-CM | POA: Diagnosis not present

## 2015-09-07 DIAGNOSIS — E1129 Type 2 diabetes mellitus with other diabetic kidney complication: Secondary | ICD-10-CM | POA: Diagnosis not present

## 2015-09-07 DIAGNOSIS — J3089 Other allergic rhinitis: Secondary | ICD-10-CM | POA: Diagnosis not present

## 2015-09-07 DIAGNOSIS — Z1389 Encounter for screening for other disorder: Secondary | ICD-10-CM | POA: Diagnosis not present

## 2015-09-07 DIAGNOSIS — J301 Allergic rhinitis due to pollen: Secondary | ICD-10-CM | POA: Diagnosis not present

## 2015-09-07 DIAGNOSIS — J3081 Allergic rhinitis due to animal (cat) (dog) hair and dander: Secondary | ICD-10-CM | POA: Diagnosis not present

## 2015-09-07 DIAGNOSIS — I10 Essential (primary) hypertension: Secondary | ICD-10-CM | POA: Diagnosis not present

## 2015-09-07 DIAGNOSIS — E039 Hypothyroidism, unspecified: Secondary | ICD-10-CM | POA: Diagnosis not present

## 2015-09-07 DIAGNOSIS — Z6832 Body mass index (BMI) 32.0-32.9, adult: Secondary | ICD-10-CM | POA: Diagnosis not present

## 2015-09-07 DIAGNOSIS — N183 Chronic kidney disease, stage 3 (moderate): Secondary | ICD-10-CM | POA: Diagnosis not present

## 2015-09-13 DIAGNOSIS — J3081 Allergic rhinitis due to animal (cat) (dog) hair and dander: Secondary | ICD-10-CM | POA: Diagnosis not present

## 2015-09-13 DIAGNOSIS — J301 Allergic rhinitis due to pollen: Secondary | ICD-10-CM | POA: Diagnosis not present

## 2015-09-13 DIAGNOSIS — J3089 Other allergic rhinitis: Secondary | ICD-10-CM | POA: Diagnosis not present

## 2015-09-20 DIAGNOSIS — J301 Allergic rhinitis due to pollen: Secondary | ICD-10-CM | POA: Diagnosis not present

## 2015-09-20 DIAGNOSIS — J3081 Allergic rhinitis due to animal (cat) (dog) hair and dander: Secondary | ICD-10-CM | POA: Diagnosis not present

## 2015-09-20 DIAGNOSIS — J3089 Other allergic rhinitis: Secondary | ICD-10-CM | POA: Diagnosis not present

## 2015-09-27 DIAGNOSIS — J3081 Allergic rhinitis due to animal (cat) (dog) hair and dander: Secondary | ICD-10-CM | POA: Diagnosis not present

## 2015-09-27 DIAGNOSIS — J3089 Other allergic rhinitis: Secondary | ICD-10-CM | POA: Diagnosis not present

## 2015-09-27 DIAGNOSIS — J301 Allergic rhinitis due to pollen: Secondary | ICD-10-CM | POA: Diagnosis not present

## 2015-10-04 DIAGNOSIS — J3089 Other allergic rhinitis: Secondary | ICD-10-CM | POA: Diagnosis not present

## 2015-10-04 DIAGNOSIS — J3081 Allergic rhinitis due to animal (cat) (dog) hair and dander: Secondary | ICD-10-CM | POA: Diagnosis not present

## 2015-10-04 DIAGNOSIS — J301 Allergic rhinitis due to pollen: Secondary | ICD-10-CM | POA: Diagnosis not present

## 2015-10-07 DIAGNOSIS — J3081 Allergic rhinitis due to animal (cat) (dog) hair and dander: Secondary | ICD-10-CM | POA: Diagnosis not present

## 2015-10-07 DIAGNOSIS — J3089 Other allergic rhinitis: Secondary | ICD-10-CM | POA: Diagnosis not present

## 2015-10-11 DIAGNOSIS — J3081 Allergic rhinitis due to animal (cat) (dog) hair and dander: Secondary | ICD-10-CM | POA: Diagnosis not present

## 2015-10-11 DIAGNOSIS — J301 Allergic rhinitis due to pollen: Secondary | ICD-10-CM | POA: Diagnosis not present

## 2015-10-11 DIAGNOSIS — J3089 Other allergic rhinitis: Secondary | ICD-10-CM | POA: Diagnosis not present

## 2015-10-18 DIAGNOSIS — J3081 Allergic rhinitis due to animal (cat) (dog) hair and dander: Secondary | ICD-10-CM | POA: Diagnosis not present

## 2015-10-18 DIAGNOSIS — J3089 Other allergic rhinitis: Secondary | ICD-10-CM | POA: Diagnosis not present

## 2015-10-18 DIAGNOSIS — J301 Allergic rhinitis due to pollen: Secondary | ICD-10-CM | POA: Diagnosis not present

## 2015-10-25 DIAGNOSIS — J301 Allergic rhinitis due to pollen: Secondary | ICD-10-CM | POA: Diagnosis not present

## 2015-10-25 DIAGNOSIS — J3089 Other allergic rhinitis: Secondary | ICD-10-CM | POA: Diagnosis not present

## 2015-10-25 DIAGNOSIS — J3081 Allergic rhinitis due to animal (cat) (dog) hair and dander: Secondary | ICD-10-CM | POA: Diagnosis not present

## 2015-11-01 DIAGNOSIS — J3089 Other allergic rhinitis: Secondary | ICD-10-CM | POA: Diagnosis not present

## 2015-11-01 DIAGNOSIS — J3081 Allergic rhinitis due to animal (cat) (dog) hair and dander: Secondary | ICD-10-CM | POA: Diagnosis not present

## 2015-11-01 DIAGNOSIS — J301 Allergic rhinitis due to pollen: Secondary | ICD-10-CM | POA: Diagnosis not present

## 2015-11-03 DIAGNOSIS — M4727 Other spondylosis with radiculopathy, lumbosacral region: Secondary | ICD-10-CM | POA: Diagnosis not present

## 2015-11-03 DIAGNOSIS — M25551 Pain in right hip: Secondary | ICD-10-CM | POA: Diagnosis not present

## 2015-11-08 DIAGNOSIS — J3081 Allergic rhinitis due to animal (cat) (dog) hair and dander: Secondary | ICD-10-CM | POA: Diagnosis not present

## 2015-11-08 DIAGNOSIS — J3089 Other allergic rhinitis: Secondary | ICD-10-CM | POA: Diagnosis not present

## 2015-11-08 DIAGNOSIS — J301 Allergic rhinitis due to pollen: Secondary | ICD-10-CM | POA: Diagnosis not present

## 2015-11-15 DIAGNOSIS — J3081 Allergic rhinitis due to animal (cat) (dog) hair and dander: Secondary | ICD-10-CM | POA: Diagnosis not present

## 2015-11-15 DIAGNOSIS — M545 Low back pain: Secondary | ICD-10-CM | POA: Diagnosis not present

## 2015-11-15 DIAGNOSIS — J3089 Other allergic rhinitis: Secondary | ICD-10-CM | POA: Diagnosis not present

## 2015-11-15 DIAGNOSIS — J301 Allergic rhinitis due to pollen: Secondary | ICD-10-CM | POA: Diagnosis not present

## 2015-11-22 DIAGNOSIS — J301 Allergic rhinitis due to pollen: Secondary | ICD-10-CM | POA: Diagnosis not present

## 2015-11-22 DIAGNOSIS — J3081 Allergic rhinitis due to animal (cat) (dog) hair and dander: Secondary | ICD-10-CM | POA: Diagnosis not present

## 2015-11-22 DIAGNOSIS — J3089 Other allergic rhinitis: Secondary | ICD-10-CM | POA: Diagnosis not present

## 2015-11-24 DIAGNOSIS — M5416 Radiculopathy, lumbar region: Secondary | ICD-10-CM | POA: Diagnosis not present

## 2015-11-24 DIAGNOSIS — M4727 Other spondylosis with radiculopathy, lumbosacral region: Secondary | ICD-10-CM | POA: Diagnosis not present

## 2015-11-24 DIAGNOSIS — M25551 Pain in right hip: Secondary | ICD-10-CM | POA: Diagnosis not present

## 2015-11-30 DIAGNOSIS — J3081 Allergic rhinitis due to animal (cat) (dog) hair and dander: Secondary | ICD-10-CM | POA: Diagnosis not present

## 2015-11-30 DIAGNOSIS — J301 Allergic rhinitis due to pollen: Secondary | ICD-10-CM | POA: Diagnosis not present

## 2015-11-30 DIAGNOSIS — J3089 Other allergic rhinitis: Secondary | ICD-10-CM | POA: Diagnosis not present

## 2015-12-06 DIAGNOSIS — J3081 Allergic rhinitis due to animal (cat) (dog) hair and dander: Secondary | ICD-10-CM | POA: Diagnosis not present

## 2015-12-06 DIAGNOSIS — J3089 Other allergic rhinitis: Secondary | ICD-10-CM | POA: Diagnosis not present

## 2015-12-06 DIAGNOSIS — J301 Allergic rhinitis due to pollen: Secondary | ICD-10-CM | POA: Diagnosis not present

## 2015-12-14 DIAGNOSIS — J3081 Allergic rhinitis due to animal (cat) (dog) hair and dander: Secondary | ICD-10-CM | POA: Diagnosis not present

## 2015-12-14 DIAGNOSIS — J301 Allergic rhinitis due to pollen: Secondary | ICD-10-CM | POA: Diagnosis not present

## 2015-12-14 DIAGNOSIS — J3089 Other allergic rhinitis: Secondary | ICD-10-CM | POA: Diagnosis not present

## 2015-12-16 DIAGNOSIS — M25551 Pain in right hip: Secondary | ICD-10-CM | POA: Diagnosis not present

## 2015-12-16 DIAGNOSIS — M5416 Radiculopathy, lumbar region: Secondary | ICD-10-CM | POA: Diagnosis not present

## 2015-12-21 DIAGNOSIS — J301 Allergic rhinitis due to pollen: Secondary | ICD-10-CM | POA: Diagnosis not present

## 2015-12-21 DIAGNOSIS — J3081 Allergic rhinitis due to animal (cat) (dog) hair and dander: Secondary | ICD-10-CM | POA: Diagnosis not present

## 2015-12-21 DIAGNOSIS — J3089 Other allergic rhinitis: Secondary | ICD-10-CM | POA: Diagnosis not present

## 2015-12-28 DIAGNOSIS — J301 Allergic rhinitis due to pollen: Secondary | ICD-10-CM | POA: Diagnosis not present

## 2015-12-28 DIAGNOSIS — J3089 Other allergic rhinitis: Secondary | ICD-10-CM | POA: Diagnosis not present

## 2015-12-28 DIAGNOSIS — M25551 Pain in right hip: Secondary | ICD-10-CM | POA: Diagnosis not present

## 2015-12-28 DIAGNOSIS — M1611 Unilateral primary osteoarthritis, right hip: Secondary | ICD-10-CM | POA: Diagnosis not present

## 2015-12-28 DIAGNOSIS — J3081 Allergic rhinitis due to animal (cat) (dog) hair and dander: Secondary | ICD-10-CM | POA: Diagnosis not present

## 2015-12-30 DIAGNOSIS — J301 Allergic rhinitis due to pollen: Secondary | ICD-10-CM | POA: Diagnosis not present

## 2016-01-05 DIAGNOSIS — G4736 Sleep related hypoventilation in conditions classified elsewhere: Secondary | ICD-10-CM | POA: Diagnosis not present

## 2016-01-05 DIAGNOSIS — J3081 Allergic rhinitis due to animal (cat) (dog) hair and dander: Secondary | ICD-10-CM | POA: Diagnosis not present

## 2016-01-05 DIAGNOSIS — J3089 Other allergic rhinitis: Secondary | ICD-10-CM | POA: Diagnosis not present

## 2016-01-05 DIAGNOSIS — J301 Allergic rhinitis due to pollen: Secondary | ICD-10-CM | POA: Diagnosis not present

## 2016-01-05 DIAGNOSIS — D509 Iron deficiency anemia, unspecified: Secondary | ICD-10-CM | POA: Diagnosis not present

## 2016-01-05 DIAGNOSIS — I10 Essential (primary) hypertension: Secondary | ICD-10-CM | POA: Diagnosis not present

## 2016-01-05 DIAGNOSIS — Z6832 Body mass index (BMI) 32.0-32.9, adult: Secondary | ICD-10-CM | POA: Diagnosis not present

## 2016-01-05 DIAGNOSIS — N183 Chronic kidney disease, stage 3 (moderate): Secondary | ICD-10-CM | POA: Diagnosis not present

## 2016-01-05 DIAGNOSIS — M1990 Unspecified osteoarthritis, unspecified site: Secondary | ICD-10-CM | POA: Diagnosis not present

## 2016-01-05 DIAGNOSIS — E038 Other specified hypothyroidism: Secondary | ICD-10-CM | POA: Diagnosis not present

## 2016-01-05 DIAGNOSIS — E119 Type 2 diabetes mellitus without complications: Secondary | ICD-10-CM | POA: Diagnosis not present

## 2016-01-05 DIAGNOSIS — E784 Other hyperlipidemia: Secondary | ICD-10-CM | POA: Diagnosis not present

## 2016-01-06 DIAGNOSIS — M25551 Pain in right hip: Secondary | ICD-10-CM | POA: Diagnosis not present

## 2016-01-06 DIAGNOSIS — M5416 Radiculopathy, lumbar region: Secondary | ICD-10-CM | POA: Diagnosis not present

## 2016-01-06 DIAGNOSIS — Z6832 Body mass index (BMI) 32.0-32.9, adult: Secondary | ICD-10-CM | POA: Diagnosis not present

## 2016-01-06 DIAGNOSIS — M4727 Other spondylosis with radiculopathy, lumbosacral region: Secondary | ICD-10-CM | POA: Diagnosis not present

## 2016-01-11 DIAGNOSIS — J301 Allergic rhinitis due to pollen: Secondary | ICD-10-CM | POA: Diagnosis not present

## 2016-01-11 DIAGNOSIS — J3081 Allergic rhinitis due to animal (cat) (dog) hair and dander: Secondary | ICD-10-CM | POA: Diagnosis not present

## 2016-01-11 DIAGNOSIS — J3089 Other allergic rhinitis: Secondary | ICD-10-CM | POA: Diagnosis not present

## 2016-01-17 DIAGNOSIS — J301 Allergic rhinitis due to pollen: Secondary | ICD-10-CM | POA: Diagnosis not present

## 2016-01-17 DIAGNOSIS — J3081 Allergic rhinitis due to animal (cat) (dog) hair and dander: Secondary | ICD-10-CM | POA: Diagnosis not present

## 2016-01-17 DIAGNOSIS — J3089 Other allergic rhinitis: Secondary | ICD-10-CM | POA: Diagnosis not present

## 2016-01-19 DIAGNOSIS — M1611 Unilateral primary osteoarthritis, right hip: Secondary | ICD-10-CM | POA: Diagnosis not present

## 2016-01-25 DIAGNOSIS — J3081 Allergic rhinitis due to animal (cat) (dog) hair and dander: Secondary | ICD-10-CM | POA: Diagnosis not present

## 2016-01-25 DIAGNOSIS — J3089 Other allergic rhinitis: Secondary | ICD-10-CM | POA: Diagnosis not present

## 2016-01-25 DIAGNOSIS — J301 Allergic rhinitis due to pollen: Secondary | ICD-10-CM | POA: Diagnosis not present

## 2016-01-31 DIAGNOSIS — J3081 Allergic rhinitis due to animal (cat) (dog) hair and dander: Secondary | ICD-10-CM | POA: Diagnosis not present

## 2016-01-31 DIAGNOSIS — J3089 Other allergic rhinitis: Secondary | ICD-10-CM | POA: Diagnosis not present

## 2016-01-31 DIAGNOSIS — J301 Allergic rhinitis due to pollen: Secondary | ICD-10-CM | POA: Diagnosis not present

## 2016-02-02 DIAGNOSIS — J301 Allergic rhinitis due to pollen: Secondary | ICD-10-CM | POA: Diagnosis not present

## 2016-02-02 DIAGNOSIS — M1611 Unilateral primary osteoarthritis, right hip: Secondary | ICD-10-CM | POA: Diagnosis not present

## 2016-02-07 DIAGNOSIS — J301 Allergic rhinitis due to pollen: Secondary | ICD-10-CM | POA: Diagnosis not present

## 2016-02-07 DIAGNOSIS — J3081 Allergic rhinitis due to animal (cat) (dog) hair and dander: Secondary | ICD-10-CM | POA: Diagnosis not present

## 2016-02-07 DIAGNOSIS — J3089 Other allergic rhinitis: Secondary | ICD-10-CM | POA: Diagnosis not present

## 2016-02-15 DIAGNOSIS — J301 Allergic rhinitis due to pollen: Secondary | ICD-10-CM | POA: Diagnosis not present

## 2016-02-15 DIAGNOSIS — J3081 Allergic rhinitis due to animal (cat) (dog) hair and dander: Secondary | ICD-10-CM | POA: Diagnosis not present

## 2016-02-15 DIAGNOSIS — J3089 Other allergic rhinitis: Secondary | ICD-10-CM | POA: Diagnosis not present

## 2016-02-21 DIAGNOSIS — J3089 Other allergic rhinitis: Secondary | ICD-10-CM | POA: Diagnosis not present

## 2016-02-21 DIAGNOSIS — J3081 Allergic rhinitis due to animal (cat) (dog) hair and dander: Secondary | ICD-10-CM | POA: Diagnosis not present

## 2016-02-21 DIAGNOSIS — J301 Allergic rhinitis due to pollen: Secondary | ICD-10-CM | POA: Diagnosis not present

## 2016-03-01 DIAGNOSIS — J3081 Allergic rhinitis due to animal (cat) (dog) hair and dander: Secondary | ICD-10-CM | POA: Diagnosis not present

## 2016-03-01 DIAGNOSIS — J301 Allergic rhinitis due to pollen: Secondary | ICD-10-CM | POA: Diagnosis not present

## 2016-03-01 DIAGNOSIS — J3089 Other allergic rhinitis: Secondary | ICD-10-CM | POA: Diagnosis not present

## 2016-03-03 DIAGNOSIS — J3089 Other allergic rhinitis: Secondary | ICD-10-CM | POA: Diagnosis not present

## 2016-03-03 DIAGNOSIS — J3081 Allergic rhinitis due to animal (cat) (dog) hair and dander: Secondary | ICD-10-CM | POA: Diagnosis not present

## 2016-03-06 DIAGNOSIS — J3081 Allergic rhinitis due to animal (cat) (dog) hair and dander: Secondary | ICD-10-CM | POA: Diagnosis not present

## 2016-03-06 DIAGNOSIS — J301 Allergic rhinitis due to pollen: Secondary | ICD-10-CM | POA: Diagnosis not present

## 2016-03-06 DIAGNOSIS — J3089 Other allergic rhinitis: Secondary | ICD-10-CM | POA: Diagnosis not present

## 2016-03-10 DIAGNOSIS — M1611 Unilateral primary osteoarthritis, right hip: Secondary | ICD-10-CM | POA: Diagnosis not present

## 2016-03-10 DIAGNOSIS — M25551 Pain in right hip: Secondary | ICD-10-CM | POA: Diagnosis not present

## 2016-03-13 DIAGNOSIS — J3081 Allergic rhinitis due to animal (cat) (dog) hair and dander: Secondary | ICD-10-CM | POA: Diagnosis not present

## 2016-03-13 DIAGNOSIS — J3089 Other allergic rhinitis: Secondary | ICD-10-CM | POA: Diagnosis not present

## 2016-03-13 DIAGNOSIS — J301 Allergic rhinitis due to pollen: Secondary | ICD-10-CM | POA: Diagnosis not present

## 2016-03-21 DIAGNOSIS — J3081 Allergic rhinitis due to animal (cat) (dog) hair and dander: Secondary | ICD-10-CM | POA: Diagnosis not present

## 2016-03-21 DIAGNOSIS — J301 Allergic rhinitis due to pollen: Secondary | ICD-10-CM | POA: Diagnosis not present

## 2016-03-21 DIAGNOSIS — J3089 Other allergic rhinitis: Secondary | ICD-10-CM | POA: Diagnosis not present

## 2016-03-27 DIAGNOSIS — J3089 Other allergic rhinitis: Secondary | ICD-10-CM | POA: Diagnosis not present

## 2016-03-27 DIAGNOSIS — J301 Allergic rhinitis due to pollen: Secondary | ICD-10-CM | POA: Diagnosis not present

## 2016-03-27 DIAGNOSIS — J3081 Allergic rhinitis due to animal (cat) (dog) hair and dander: Secondary | ICD-10-CM | POA: Diagnosis not present

## 2016-04-03 DIAGNOSIS — M5416 Radiculopathy, lumbar region: Secondary | ICD-10-CM | POA: Diagnosis not present

## 2016-04-03 DIAGNOSIS — M25551 Pain in right hip: Secondary | ICD-10-CM | POA: Diagnosis not present

## 2016-04-03 DIAGNOSIS — M4727 Other spondylosis with radiculopathy, lumbosacral region: Secondary | ICD-10-CM | POA: Diagnosis not present

## 2016-04-04 DIAGNOSIS — J301 Allergic rhinitis due to pollen: Secondary | ICD-10-CM | POA: Diagnosis not present

## 2016-04-04 DIAGNOSIS — J3089 Other allergic rhinitis: Secondary | ICD-10-CM | POA: Diagnosis not present

## 2016-04-04 DIAGNOSIS — J3081 Allergic rhinitis due to animal (cat) (dog) hair and dander: Secondary | ICD-10-CM | POA: Diagnosis not present

## 2016-04-10 DIAGNOSIS — J3081 Allergic rhinitis due to animal (cat) (dog) hair and dander: Secondary | ICD-10-CM | POA: Diagnosis not present

## 2016-04-10 DIAGNOSIS — J3089 Other allergic rhinitis: Secondary | ICD-10-CM | POA: Diagnosis not present

## 2016-04-10 DIAGNOSIS — J301 Allergic rhinitis due to pollen: Secondary | ICD-10-CM | POA: Diagnosis not present

## 2016-04-17 DIAGNOSIS — J3081 Allergic rhinitis due to animal (cat) (dog) hair and dander: Secondary | ICD-10-CM | POA: Diagnosis not present

## 2016-04-17 DIAGNOSIS — J3089 Other allergic rhinitis: Secondary | ICD-10-CM | POA: Diagnosis not present

## 2016-04-17 DIAGNOSIS — J301 Allergic rhinitis due to pollen: Secondary | ICD-10-CM | POA: Diagnosis not present

## 2016-04-24 DIAGNOSIS — J301 Allergic rhinitis due to pollen: Secondary | ICD-10-CM | POA: Diagnosis not present

## 2016-04-24 DIAGNOSIS — J3081 Allergic rhinitis due to animal (cat) (dog) hair and dander: Secondary | ICD-10-CM | POA: Diagnosis not present

## 2016-04-24 DIAGNOSIS — J3089 Other allergic rhinitis: Secondary | ICD-10-CM | POA: Diagnosis not present

## 2016-05-02 DIAGNOSIS — J301 Allergic rhinitis due to pollen: Secondary | ICD-10-CM | POA: Diagnosis not present

## 2016-05-02 DIAGNOSIS — J3081 Allergic rhinitis due to animal (cat) (dog) hair and dander: Secondary | ICD-10-CM | POA: Diagnosis not present

## 2016-05-02 DIAGNOSIS — J3089 Other allergic rhinitis: Secondary | ICD-10-CM | POA: Diagnosis not present

## 2016-05-04 DIAGNOSIS — M4727 Other spondylosis with radiculopathy, lumbosacral region: Secondary | ICD-10-CM | POA: Diagnosis not present

## 2016-05-04 DIAGNOSIS — M5416 Radiculopathy, lumbar region: Secondary | ICD-10-CM | POA: Diagnosis not present

## 2016-05-09 DIAGNOSIS — J301 Allergic rhinitis due to pollen: Secondary | ICD-10-CM | POA: Diagnosis not present

## 2016-05-09 DIAGNOSIS — J3089 Other allergic rhinitis: Secondary | ICD-10-CM | POA: Diagnosis not present

## 2016-05-09 DIAGNOSIS — J3081 Allergic rhinitis due to animal (cat) (dog) hair and dander: Secondary | ICD-10-CM | POA: Diagnosis not present

## 2016-05-10 DIAGNOSIS — E119 Type 2 diabetes mellitus without complications: Secondary | ICD-10-CM | POA: Diagnosis not present

## 2016-05-10 DIAGNOSIS — H353131 Nonexudative age-related macular degeneration, bilateral, early dry stage: Secondary | ICD-10-CM | POA: Diagnosis not present

## 2016-05-10 DIAGNOSIS — H40013 Open angle with borderline findings, low risk, bilateral: Secondary | ICD-10-CM | POA: Diagnosis not present

## 2016-05-10 DIAGNOSIS — Z961 Presence of intraocular lens: Secondary | ICD-10-CM | POA: Diagnosis not present

## 2016-05-15 DIAGNOSIS — J3089 Other allergic rhinitis: Secondary | ICD-10-CM | POA: Diagnosis not present

## 2016-05-15 DIAGNOSIS — J301 Allergic rhinitis due to pollen: Secondary | ICD-10-CM | POA: Diagnosis not present

## 2016-05-15 DIAGNOSIS — J3081 Allergic rhinitis due to animal (cat) (dog) hair and dander: Secondary | ICD-10-CM | POA: Diagnosis not present

## 2016-05-22 DIAGNOSIS — J3089 Other allergic rhinitis: Secondary | ICD-10-CM | POA: Diagnosis not present

## 2016-05-22 DIAGNOSIS — J301 Allergic rhinitis due to pollen: Secondary | ICD-10-CM | POA: Diagnosis not present

## 2016-05-22 DIAGNOSIS — J3081 Allergic rhinitis due to animal (cat) (dog) hair and dander: Secondary | ICD-10-CM | POA: Diagnosis not present

## 2016-05-25 DIAGNOSIS — J301 Allergic rhinitis due to pollen: Secondary | ICD-10-CM | POA: Diagnosis not present

## 2016-05-30 DIAGNOSIS — E119 Type 2 diabetes mellitus without complications: Secondary | ICD-10-CM | POA: Diagnosis not present

## 2016-05-30 DIAGNOSIS — R05 Cough: Secondary | ICD-10-CM | POA: Diagnosis not present

## 2016-05-30 DIAGNOSIS — J3081 Allergic rhinitis due to animal (cat) (dog) hair and dander: Secondary | ICD-10-CM | POA: Diagnosis not present

## 2016-05-30 DIAGNOSIS — J209 Acute bronchitis, unspecified: Secondary | ICD-10-CM | POA: Diagnosis not present

## 2016-05-30 DIAGNOSIS — J301 Allergic rhinitis due to pollen: Secondary | ICD-10-CM | POA: Diagnosis not present

## 2016-05-30 DIAGNOSIS — J3089 Other allergic rhinitis: Secondary | ICD-10-CM | POA: Diagnosis not present

## 2016-05-30 DIAGNOSIS — Z6832 Body mass index (BMI) 32.0-32.9, adult: Secondary | ICD-10-CM | POA: Diagnosis not present

## 2016-05-30 DIAGNOSIS — J302 Other seasonal allergic rhinitis: Secondary | ICD-10-CM | POA: Diagnosis not present

## 2016-06-05 DIAGNOSIS — J301 Allergic rhinitis due to pollen: Secondary | ICD-10-CM | POA: Diagnosis not present

## 2016-06-05 DIAGNOSIS — J3089 Other allergic rhinitis: Secondary | ICD-10-CM | POA: Diagnosis not present

## 2016-06-05 DIAGNOSIS — J3081 Allergic rhinitis due to animal (cat) (dog) hair and dander: Secondary | ICD-10-CM | POA: Diagnosis not present

## 2016-06-06 DIAGNOSIS — Z6832 Body mass index (BMI) 32.0-32.9, adult: Secondary | ICD-10-CM | POA: Diagnosis not present

## 2016-06-06 DIAGNOSIS — M5416 Radiculopathy, lumbar region: Secondary | ICD-10-CM | POA: Diagnosis not present

## 2016-06-06 DIAGNOSIS — I10 Essential (primary) hypertension: Secondary | ICD-10-CM | POA: Diagnosis not present

## 2016-06-06 DIAGNOSIS — M4727 Other spondylosis with radiculopathy, lumbosacral region: Secondary | ICD-10-CM | POA: Diagnosis not present

## 2016-06-12 DIAGNOSIS — J301 Allergic rhinitis due to pollen: Secondary | ICD-10-CM | POA: Diagnosis not present

## 2016-06-12 DIAGNOSIS — J3089 Other allergic rhinitis: Secondary | ICD-10-CM | POA: Diagnosis not present

## 2016-06-12 DIAGNOSIS — J3081 Allergic rhinitis due to animal (cat) (dog) hair and dander: Secondary | ICD-10-CM | POA: Diagnosis not present

## 2016-06-14 DIAGNOSIS — E119 Type 2 diabetes mellitus without complications: Secondary | ICD-10-CM | POA: Diagnosis not present

## 2016-06-14 DIAGNOSIS — N183 Chronic kidney disease, stage 3 (moderate): Secondary | ICD-10-CM | POA: Diagnosis not present

## 2016-06-14 DIAGNOSIS — Z6832 Body mass index (BMI) 32.0-32.9, adult: Secondary | ICD-10-CM | POA: Diagnosis not present

## 2016-06-14 DIAGNOSIS — R0609 Other forms of dyspnea: Secondary | ICD-10-CM | POA: Diagnosis not present

## 2016-06-14 DIAGNOSIS — Z23 Encounter for immunization: Secondary | ICD-10-CM | POA: Diagnosis not present

## 2016-06-14 DIAGNOSIS — E038 Other specified hypothyroidism: Secondary | ICD-10-CM | POA: Diagnosis not present

## 2016-06-14 DIAGNOSIS — G4736 Sleep related hypoventilation in conditions classified elsewhere: Secondary | ICD-10-CM | POA: Diagnosis not present

## 2016-06-14 DIAGNOSIS — D509 Iron deficiency anemia, unspecified: Secondary | ICD-10-CM | POA: Diagnosis not present

## 2016-06-14 DIAGNOSIS — I10 Essential (primary) hypertension: Secondary | ICD-10-CM | POA: Diagnosis not present

## 2016-06-14 DIAGNOSIS — R5383 Other fatigue: Secondary | ICD-10-CM | POA: Diagnosis not present

## 2016-06-20 DIAGNOSIS — J3089 Other allergic rhinitis: Secondary | ICD-10-CM | POA: Diagnosis not present

## 2016-06-20 DIAGNOSIS — J3081 Allergic rhinitis due to animal (cat) (dog) hair and dander: Secondary | ICD-10-CM | POA: Diagnosis not present

## 2016-06-20 DIAGNOSIS — J301 Allergic rhinitis due to pollen: Secondary | ICD-10-CM | POA: Diagnosis not present

## 2016-06-26 DIAGNOSIS — J301 Allergic rhinitis due to pollen: Secondary | ICD-10-CM | POA: Diagnosis not present

## 2016-06-26 DIAGNOSIS — J3081 Allergic rhinitis due to animal (cat) (dog) hair and dander: Secondary | ICD-10-CM | POA: Diagnosis not present

## 2016-06-26 DIAGNOSIS — J3089 Other allergic rhinitis: Secondary | ICD-10-CM | POA: Diagnosis not present

## 2016-07-03 DIAGNOSIS — J3081 Allergic rhinitis due to animal (cat) (dog) hair and dander: Secondary | ICD-10-CM | POA: Diagnosis not present

## 2016-07-03 DIAGNOSIS — J3089 Other allergic rhinitis: Secondary | ICD-10-CM | POA: Diagnosis not present

## 2016-07-03 DIAGNOSIS — J301 Allergic rhinitis due to pollen: Secondary | ICD-10-CM | POA: Diagnosis not present

## 2016-07-04 ENCOUNTER — Other Ambulatory Visit: Payer: Self-pay | Admitting: Internal Medicine

## 2016-07-04 ENCOUNTER — Ambulatory Visit (HOSPITAL_COMMUNITY): Payer: Medicare Other | Attending: Cardiology

## 2016-07-04 ENCOUNTER — Other Ambulatory Visit: Payer: Self-pay

## 2016-07-04 DIAGNOSIS — I351 Nonrheumatic aortic (valve) insufficiency: Secondary | ICD-10-CM | POA: Diagnosis not present

## 2016-07-04 DIAGNOSIS — I77810 Thoracic aortic ectasia: Secondary | ICD-10-CM | POA: Insufficient documentation

## 2016-07-04 DIAGNOSIS — I119 Hypertensive heart disease without heart failure: Secondary | ICD-10-CM | POA: Insufficient documentation

## 2016-07-04 DIAGNOSIS — R0600 Dyspnea, unspecified: Secondary | ICD-10-CM | POA: Diagnosis present

## 2016-07-04 DIAGNOSIS — R0609 Other forms of dyspnea: Secondary | ICD-10-CM | POA: Diagnosis not present

## 2016-07-04 DIAGNOSIS — I071 Rheumatic tricuspid insufficiency: Secondary | ICD-10-CM | POA: Diagnosis not present

## 2016-07-04 DIAGNOSIS — E119 Type 2 diabetes mellitus without complications: Secondary | ICD-10-CM | POA: Diagnosis not present

## 2016-07-04 DIAGNOSIS — I371 Nonrheumatic pulmonary valve insufficiency: Secondary | ICD-10-CM | POA: Diagnosis not present

## 2016-07-04 DIAGNOSIS — I340 Nonrheumatic mitral (valve) insufficiency: Secondary | ICD-10-CM | POA: Insufficient documentation

## 2016-07-04 DIAGNOSIS — R5383 Other fatigue: Secondary | ICD-10-CM | POA: Insufficient documentation

## 2016-07-06 ENCOUNTER — Other Ambulatory Visit: Payer: Self-pay | Admitting: Internal Medicine

## 2016-07-06 DIAGNOSIS — R0609 Other forms of dyspnea: Principal | ICD-10-CM

## 2016-07-06 DIAGNOSIS — R5383 Other fatigue: Secondary | ICD-10-CM

## 2016-07-10 DIAGNOSIS — J301 Allergic rhinitis due to pollen: Secondary | ICD-10-CM | POA: Diagnosis not present

## 2016-07-10 DIAGNOSIS — J3081 Allergic rhinitis due to animal (cat) (dog) hair and dander: Secondary | ICD-10-CM | POA: Diagnosis not present

## 2016-07-10 DIAGNOSIS — J3089 Other allergic rhinitis: Secondary | ICD-10-CM | POA: Diagnosis not present

## 2016-07-17 DIAGNOSIS — J301 Allergic rhinitis due to pollen: Secondary | ICD-10-CM | POA: Diagnosis not present

## 2016-07-17 DIAGNOSIS — J3089 Other allergic rhinitis: Secondary | ICD-10-CM | POA: Diagnosis not present

## 2016-07-17 DIAGNOSIS — J3081 Allergic rhinitis due to animal (cat) (dog) hair and dander: Secondary | ICD-10-CM | POA: Diagnosis not present

## 2016-07-24 DIAGNOSIS — J301 Allergic rhinitis due to pollen: Secondary | ICD-10-CM | POA: Diagnosis not present

## 2016-07-24 DIAGNOSIS — J3081 Allergic rhinitis due to animal (cat) (dog) hair and dander: Secondary | ICD-10-CM | POA: Diagnosis not present

## 2016-07-24 DIAGNOSIS — J3089 Other allergic rhinitis: Secondary | ICD-10-CM | POA: Diagnosis not present

## 2016-07-26 DIAGNOSIS — J301 Allergic rhinitis due to pollen: Secondary | ICD-10-CM | POA: Diagnosis not present

## 2016-07-26 DIAGNOSIS — J3089 Other allergic rhinitis: Secondary | ICD-10-CM | POA: Diagnosis not present

## 2016-07-26 DIAGNOSIS — H1045 Other chronic allergic conjunctivitis: Secondary | ICD-10-CM | POA: Diagnosis not present

## 2016-07-31 DIAGNOSIS — J3081 Allergic rhinitis due to animal (cat) (dog) hair and dander: Secondary | ICD-10-CM | POA: Diagnosis not present

## 2016-07-31 DIAGNOSIS — J3089 Other allergic rhinitis: Secondary | ICD-10-CM | POA: Diagnosis not present

## 2016-07-31 DIAGNOSIS — J301 Allergic rhinitis due to pollen: Secondary | ICD-10-CM | POA: Diagnosis not present

## 2016-08-03 DIAGNOSIS — M5416 Radiculopathy, lumbar region: Secondary | ICD-10-CM | POA: Diagnosis not present

## 2016-08-03 DIAGNOSIS — M4727 Other spondylosis with radiculopathy, lumbosacral region: Secondary | ICD-10-CM | POA: Diagnosis not present

## 2016-08-07 DIAGNOSIS — J3089 Other allergic rhinitis: Secondary | ICD-10-CM | POA: Diagnosis not present

## 2016-08-07 DIAGNOSIS — J301 Allergic rhinitis due to pollen: Secondary | ICD-10-CM | POA: Diagnosis not present

## 2016-08-07 DIAGNOSIS — J3081 Allergic rhinitis due to animal (cat) (dog) hair and dander: Secondary | ICD-10-CM | POA: Diagnosis not present

## 2016-08-08 DIAGNOSIS — J3081 Allergic rhinitis due to animal (cat) (dog) hair and dander: Secondary | ICD-10-CM | POA: Diagnosis not present

## 2016-08-08 DIAGNOSIS — J3089 Other allergic rhinitis: Secondary | ICD-10-CM | POA: Diagnosis not present

## 2016-08-14 DIAGNOSIS — J301 Allergic rhinitis due to pollen: Secondary | ICD-10-CM | POA: Diagnosis not present

## 2016-08-14 DIAGNOSIS — J3081 Allergic rhinitis due to animal (cat) (dog) hair and dander: Secondary | ICD-10-CM | POA: Diagnosis not present

## 2016-08-14 DIAGNOSIS — J3089 Other allergic rhinitis: Secondary | ICD-10-CM | POA: Diagnosis not present

## 2016-08-22 DIAGNOSIS — J301 Allergic rhinitis due to pollen: Secondary | ICD-10-CM | POA: Diagnosis not present

## 2016-08-22 DIAGNOSIS — J3089 Other allergic rhinitis: Secondary | ICD-10-CM | POA: Diagnosis not present

## 2016-08-22 DIAGNOSIS — J3081 Allergic rhinitis due to animal (cat) (dog) hair and dander: Secondary | ICD-10-CM | POA: Diagnosis not present

## 2016-08-29 DIAGNOSIS — J301 Allergic rhinitis due to pollen: Secondary | ICD-10-CM | POA: Diagnosis not present

## 2016-08-29 DIAGNOSIS — J3081 Allergic rhinitis due to animal (cat) (dog) hair and dander: Secondary | ICD-10-CM | POA: Diagnosis not present

## 2016-08-29 DIAGNOSIS — J3089 Other allergic rhinitis: Secondary | ICD-10-CM | POA: Diagnosis not present

## 2016-09-04 DIAGNOSIS — J3089 Other allergic rhinitis: Secondary | ICD-10-CM | POA: Diagnosis not present

## 2016-09-04 DIAGNOSIS — J3081 Allergic rhinitis due to animal (cat) (dog) hair and dander: Secondary | ICD-10-CM | POA: Diagnosis not present

## 2016-09-04 DIAGNOSIS — J301 Allergic rhinitis due to pollen: Secondary | ICD-10-CM | POA: Diagnosis not present

## 2016-09-07 DIAGNOSIS — I10 Essential (primary) hypertension: Secondary | ICD-10-CM | POA: Diagnosis not present

## 2016-09-07 DIAGNOSIS — Z6831 Body mass index (BMI) 31.0-31.9, adult: Secondary | ICD-10-CM | POA: Diagnosis not present

## 2016-09-07 DIAGNOSIS — M4727 Other spondylosis with radiculopathy, lumbosacral region: Secondary | ICD-10-CM | POA: Diagnosis not present

## 2016-09-07 DIAGNOSIS — M5416 Radiculopathy, lumbar region: Secondary | ICD-10-CM | POA: Diagnosis not present

## 2016-09-11 DIAGNOSIS — J3081 Allergic rhinitis due to animal (cat) (dog) hair and dander: Secondary | ICD-10-CM | POA: Diagnosis not present

## 2016-09-11 DIAGNOSIS — J301 Allergic rhinitis due to pollen: Secondary | ICD-10-CM | POA: Diagnosis not present

## 2016-09-11 DIAGNOSIS — J3089 Other allergic rhinitis: Secondary | ICD-10-CM | POA: Diagnosis not present

## 2016-09-19 DIAGNOSIS — J3081 Allergic rhinitis due to animal (cat) (dog) hair and dander: Secondary | ICD-10-CM | POA: Diagnosis not present

## 2016-09-19 DIAGNOSIS — J301 Allergic rhinitis due to pollen: Secondary | ICD-10-CM | POA: Diagnosis not present

## 2016-09-19 DIAGNOSIS — J3089 Other allergic rhinitis: Secondary | ICD-10-CM | POA: Diagnosis not present

## 2016-09-25 DIAGNOSIS — J3089 Other allergic rhinitis: Secondary | ICD-10-CM | POA: Diagnosis not present

## 2016-09-25 DIAGNOSIS — J3081 Allergic rhinitis due to animal (cat) (dog) hair and dander: Secondary | ICD-10-CM | POA: Diagnosis not present

## 2016-09-25 DIAGNOSIS — J301 Allergic rhinitis due to pollen: Secondary | ICD-10-CM | POA: Diagnosis not present

## 2016-10-02 DIAGNOSIS — J3081 Allergic rhinitis due to animal (cat) (dog) hair and dander: Secondary | ICD-10-CM | POA: Diagnosis not present

## 2016-10-02 DIAGNOSIS — J301 Allergic rhinitis due to pollen: Secondary | ICD-10-CM | POA: Diagnosis not present

## 2016-10-02 DIAGNOSIS — J3089 Other allergic rhinitis: Secondary | ICD-10-CM | POA: Diagnosis not present

## 2016-10-06 DIAGNOSIS — M1611 Unilateral primary osteoarthritis, right hip: Secondary | ICD-10-CM | POA: Diagnosis not present

## 2016-10-09 DIAGNOSIS — H1045 Other chronic allergic conjunctivitis: Secondary | ICD-10-CM | POA: Diagnosis not present

## 2016-10-09 DIAGNOSIS — J301 Allergic rhinitis due to pollen: Secondary | ICD-10-CM | POA: Diagnosis not present

## 2016-10-09 DIAGNOSIS — J3089 Other allergic rhinitis: Secondary | ICD-10-CM | POA: Diagnosis not present

## 2016-10-13 DIAGNOSIS — N183 Chronic kidney disease, stage 3 (moderate): Secondary | ICD-10-CM | POA: Diagnosis not present

## 2016-10-13 DIAGNOSIS — J302 Other seasonal allergic rhinitis: Secondary | ICD-10-CM | POA: Diagnosis not present

## 2016-10-13 DIAGNOSIS — I10 Essential (primary) hypertension: Secondary | ICD-10-CM | POA: Diagnosis not present

## 2016-10-13 DIAGNOSIS — E678 Other specified hyperalimentation: Secondary | ICD-10-CM | POA: Diagnosis not present

## 2016-10-13 DIAGNOSIS — E119 Type 2 diabetes mellitus without complications: Secondary | ICD-10-CM | POA: Diagnosis not present

## 2016-10-13 DIAGNOSIS — Z1389 Encounter for screening for other disorder: Secondary | ICD-10-CM | POA: Diagnosis not present

## 2016-10-13 DIAGNOSIS — R0609 Other forms of dyspnea: Secondary | ICD-10-CM | POA: Diagnosis not present

## 2016-10-13 DIAGNOSIS — M1990 Unspecified osteoarthritis, unspecified site: Secondary | ICD-10-CM | POA: Diagnosis not present

## 2016-10-13 DIAGNOSIS — Z6831 Body mass index (BMI) 31.0-31.9, adult: Secondary | ICD-10-CM | POA: Diagnosis not present

## 2016-10-13 DIAGNOSIS — D509 Iron deficiency anemia, unspecified: Secondary | ICD-10-CM | POA: Diagnosis not present

## 2016-10-13 DIAGNOSIS — E038 Other specified hypothyroidism: Secondary | ICD-10-CM | POA: Diagnosis not present

## 2016-10-17 DIAGNOSIS — J3081 Allergic rhinitis due to animal (cat) (dog) hair and dander: Secondary | ICD-10-CM | POA: Diagnosis not present

## 2016-10-17 DIAGNOSIS — J3089 Other allergic rhinitis: Secondary | ICD-10-CM | POA: Diagnosis not present

## 2016-10-17 DIAGNOSIS — J301 Allergic rhinitis due to pollen: Secondary | ICD-10-CM | POA: Diagnosis not present

## 2016-10-19 DIAGNOSIS — M1611 Unilateral primary osteoarthritis, right hip: Secondary | ICD-10-CM | POA: Diagnosis not present

## 2016-10-23 DIAGNOSIS — J3081 Allergic rhinitis due to animal (cat) (dog) hair and dander: Secondary | ICD-10-CM | POA: Diagnosis not present

## 2016-10-23 DIAGNOSIS — J301 Allergic rhinitis due to pollen: Secondary | ICD-10-CM | POA: Diagnosis not present

## 2016-10-23 DIAGNOSIS — J3089 Other allergic rhinitis: Secondary | ICD-10-CM | POA: Diagnosis not present

## 2016-10-30 DIAGNOSIS — J3081 Allergic rhinitis due to animal (cat) (dog) hair and dander: Secondary | ICD-10-CM | POA: Diagnosis not present

## 2016-10-30 DIAGNOSIS — J301 Allergic rhinitis due to pollen: Secondary | ICD-10-CM | POA: Diagnosis not present

## 2016-10-30 DIAGNOSIS — J3089 Other allergic rhinitis: Secondary | ICD-10-CM | POA: Diagnosis not present

## 2016-11-08 DIAGNOSIS — J301 Allergic rhinitis due to pollen: Secondary | ICD-10-CM | POA: Diagnosis not present

## 2016-11-21 DIAGNOSIS — M1990 Unspecified osteoarthritis, unspecified site: Secondary | ICD-10-CM | POA: Diagnosis not present

## 2016-11-21 DIAGNOSIS — Z6831 Body mass index (BMI) 31.0-31.9, adult: Secondary | ICD-10-CM | POA: Diagnosis not present

## 2016-11-21 DIAGNOSIS — E119 Type 2 diabetes mellitus without complications: Secondary | ICD-10-CM | POA: Diagnosis not present

## 2016-11-21 DIAGNOSIS — R10817 Generalized abdominal tenderness: Secondary | ICD-10-CM | POA: Diagnosis not present

## 2016-11-21 DIAGNOSIS — I10 Essential (primary) hypertension: Secondary | ICD-10-CM | POA: Diagnosis not present

## 2016-11-23 DIAGNOSIS — N183 Chronic kidney disease, stage 3 (moderate): Secondary | ICD-10-CM | POA: Diagnosis not present

## 2016-11-27 DIAGNOSIS — G8929 Other chronic pain: Secondary | ICD-10-CM | POA: Diagnosis not present

## 2016-11-27 DIAGNOSIS — M25561 Pain in right knee: Secondary | ICD-10-CM | POA: Diagnosis not present

## 2016-11-27 DIAGNOSIS — Z96651 Presence of right artificial knee joint: Secondary | ICD-10-CM | POA: Diagnosis not present

## 2016-12-20 DIAGNOSIS — M5416 Radiculopathy, lumbar region: Secondary | ICD-10-CM | POA: Diagnosis not present

## 2017-01-09 DIAGNOSIS — M4727 Other spondylosis with radiculopathy, lumbosacral region: Secondary | ICD-10-CM | POA: Diagnosis not present

## 2017-01-09 DIAGNOSIS — M5416 Radiculopathy, lumbar region: Secondary | ICD-10-CM | POA: Diagnosis not present

## 2017-01-16 DIAGNOSIS — M1611 Unilateral primary osteoarthritis, right hip: Secondary | ICD-10-CM | POA: Diagnosis not present

## 2017-02-01 DIAGNOSIS — M25551 Pain in right hip: Secondary | ICD-10-CM | POA: Diagnosis not present

## 2017-02-01 DIAGNOSIS — M1611 Unilateral primary osteoarthritis, right hip: Secondary | ICD-10-CM | POA: Diagnosis not present

## 2017-02-13 DIAGNOSIS — M5416 Radiculopathy, lumbar region: Secondary | ICD-10-CM | POA: Diagnosis not present

## 2017-02-13 DIAGNOSIS — I10 Essential (primary) hypertension: Secondary | ICD-10-CM | POA: Diagnosis not present

## 2017-02-13 DIAGNOSIS — M4727 Other spondylosis with radiculopathy, lumbosacral region: Secondary | ICD-10-CM | POA: Diagnosis not present

## 2017-02-13 DIAGNOSIS — Z6831 Body mass index (BMI) 31.0-31.9, adult: Secondary | ICD-10-CM | POA: Diagnosis not present

## 2017-02-16 DIAGNOSIS — N183 Chronic kidney disease, stage 3 (moderate): Secondary | ICD-10-CM | POA: Diagnosis not present

## 2017-02-16 DIAGNOSIS — N400 Enlarged prostate without lower urinary tract symptoms: Secondary | ICD-10-CM | POA: Diagnosis not present

## 2017-02-16 DIAGNOSIS — Z6831 Body mass index (BMI) 31.0-31.9, adult: Secondary | ICD-10-CM | POA: Diagnosis not present

## 2017-02-16 DIAGNOSIS — M1990 Unspecified osteoarthritis, unspecified site: Secondary | ICD-10-CM | POA: Diagnosis not present

## 2017-02-16 DIAGNOSIS — E678 Other specified hyperalimentation: Secondary | ICD-10-CM | POA: Diagnosis not present

## 2017-02-16 DIAGNOSIS — E119 Type 2 diabetes mellitus without complications: Secondary | ICD-10-CM | POA: Diagnosis not present

## 2017-02-16 DIAGNOSIS — I10 Essential (primary) hypertension: Secondary | ICD-10-CM | POA: Diagnosis not present

## 2017-03-01 DIAGNOSIS — M1611 Unilateral primary osteoarthritis, right hip: Secondary | ICD-10-CM | POA: Diagnosis not present

## 2017-03-08 DIAGNOSIS — G4736 Sleep related hypoventilation in conditions classified elsewhere: Secondary | ICD-10-CM | POA: Diagnosis not present

## 2017-03-15 DIAGNOSIS — M1611 Unilateral primary osteoarthritis, right hip: Secondary | ICD-10-CM | POA: Diagnosis not present

## 2017-03-22 ENCOUNTER — Other Ambulatory Visit: Payer: Self-pay

## 2017-03-22 DIAGNOSIS — R0989 Other specified symptoms and signs involving the circulatory and respiratory systems: Secondary | ICD-10-CM

## 2017-04-16 DIAGNOSIS — M5416 Radiculopathy, lumbar region: Secondary | ICD-10-CM | POA: Diagnosis not present

## 2017-04-25 ENCOUNTER — Encounter: Payer: Self-pay | Admitting: Surgery

## 2017-05-07 ENCOUNTER — Ambulatory Visit (INDEPENDENT_AMBULATORY_CARE_PROVIDER_SITE_OTHER): Payer: Medicare Other | Admitting: Surgery

## 2017-05-07 ENCOUNTER — Ambulatory Visit (HOSPITAL_COMMUNITY)
Admission: RE | Admit: 2017-05-07 | Discharge: 2017-05-07 | Disposition: A | Discharge status: Home / Self Care | Payer: Medicare Other | Source: Ambulatory Visit | Attending: Surgery | Admitting: Surgery

## 2017-05-07 VITALS — BP 199/83 | HR 69 | Temp 98.5°F | Resp 16 | Ht 65.0 in | Wt 182.0 lb

## 2017-05-07 DIAGNOSIS — R0989 Other specified symptoms and signs involving the circulatory and respiratory systems: Secondary | ICD-10-CM | POA: Diagnosis not present

## 2017-05-07 DIAGNOSIS — E0843 Diabetes mellitus due to underlying condition with diabetic autonomic (poly)neuropathy: Secondary | ICD-10-CM

## 2017-05-07 NOTE — Progress Notes (Signed)
[facility] [place]  Patient name: Ricky Sandoval MRN: [id number] DOB: [birth year] Sex: male   REQUESTING PROVIDER:    Dr. Felipa Eth   REASON FOR CONSULT:    Leg pain  HISTORY OF PRESENT ILLNESS:   Ricky Sandoval is a 83 y.o. male, who is Referred today for evaluation of right leg pain.  The patient states that this has been going on for approximately one year.  He states that it will feel very cold at times.  It does wake him up in the night.  He does walk around his house with a walker.  He does not endorse calf cramping.  He does not have any nonhealing wounds.  The patient suffers from diabetes.  His most recent hemoglobin A1c was 5.8.  He is medically managed for hypertension with an ARB.  He has stage III renal insufficiency.  He has multiple orthopedic issues including right hip degeneration as well as getting lower back injections for spinal stenosis.  He has COPD with 2 L home oxygen.  PAST MEDICAL HISTORY    Past Medical History:  Diagnosis Date  . Diabetes type 2, controlled   . High cholesterol   . Hypertension   . Hypothyroid      FAMILY HISTORY   No family history on file.  SOCIAL HISTORY:   Social History   Social History  . Marital status: Married    Spouse name: N/A  . Number of children: N/A  . Years of education: N/A   Occupational History  . Not on file.   Social History Main Topics  . Smoking status: Not on file  . Smokeless tobacco: Not on file  . Alcohol use Not on file  . Drug use: Unknown  . Sexual activity: Not on file   Other Topics Concern  . Not on file   Social History Narrative  . No narrative on file    ALLERGIES:    No Known Allergies  CURRENT MEDICATIONS:    Current Outpatient Prescriptions  Medication Sig Dispense Refill  . acetaminophen (ARTHRITIS PAIN RELIEF) 650 MG CR tablet Take 1,300 mg by mouth 2 (two) times daily.    Marland Kitchen aspirin 81 MG tablet Take 81 mg  by mouth daily.    . caffeine (NO DOZ MAXIMUM STRENGTH) 200 MG TABS tablet Take 400 mg by mouth every morning.    . Coenzyme Q10-Fish Oil-Vit E (CO-Q 10 OMEGA-3 FISH OIL PO) Take 2 tablets by mouth daily.    . diphenhydramine-acetaminophen (TYLENOL PM) 25-500 MG TABS Take 2 tablets by mouth at bedtime as needed (sleep).    . docusate sodium (COLACE) 100 MG capsule Take 300 mg by mouth daily.    . ferrous sulfate 325 (65 FE) MG tablet Take 325 mg by mouth daily with breakfast.    . flunisolide (NASALIDE) 25 MCG/ACT (0.025%) SOLN Place 2 sprays into the nose 2 (two) times daily as needed (allergies).    . gabapentin (NEURONTIN) 100 MG capsule Take 100 mg by mouth at bedtime.    . Ginkgo Biloba 60 MG CAPS Take 3 capsules by mouth 2 (two) times daily.    . Homeopathic Products (LEG CRAMP RELIEF PO) Take 1 tablet by mouth daily as needed (leg cramps). Leg cramps/quinine    . KRILL OIL PO Take 500 mg by mouth daily.    Marland Kitchen levothyroxine (SYNTHROID, LEVOTHROID) 75 MCG tablet Take 75 mcg by mouth daily before breakfast.    . loratadine (CLARITIN)  10 MG tablet Take 10 mg by mouth daily.    Marland Kitchen. losartan (COZAAR) 100 MG tablet Take 100 mg by mouth 2 (two) times daily.    . Lutein 20 MG CAPS Take 1 capsule by mouth 2 (two) times daily.    . Misc Natural Products (OSTEO BI-FLEX JOINT SHIELD PO) Take 1 tablet by mouth 2 (two) times daily.    . Multiple Vitamins-Minerals (CENTRUM SILVER PO) Take 1 tablet by mouth daily.    Marland Kitchen. omeprazole (PRILOSEC) 20 MG capsule Take 20 mg by mouth daily as needed (indigestion/heartburn).    . pyridOXINE (VITAMIN B-6) 100 MG tablet Take 100 mg by mouth daily.    . simvastatin (ZOCOR) 40 MG tablet Take 20 mg by mouth at bedtime.    . tamsulosin (FLOMAX) 0.4 MG CAPS capsule Take 0.4 mg by mouth at bedtime.    . traMADol (ULTRAM) 50 MG tablet Take by mouth every 6 (six) hours as needed.    . vitamin B-12 (CYANOCOBALAMIN) 1000 MCG tablet Take 1,000 mcg by mouth daily.    . vitamin C  (ASCORBIC ACID) 500 MG tablet Take 500 mg by mouth daily.     No current facility-administered medications for this visit.     REVIEW OF SYSTEMS:   [X]  denotes positive finding, [ ]  denotes negative finding Cardiac  Comments:  Chest pain or chest pressure:    Shortness of breath upon exertion:    Short of breath when lying flat:    Irregular heart rhythm:        Vascular    Pain in calf, thigh, or hip brought on by ambulation: x   Pain in feet at night that wakes you up from your sleep:  x   Blood clot in your veins:    Leg swelling:         Pulmonary    Oxygen at home: x   Productive cough:     Wheezing:         Neurologic    Sudden weakness in arms or legs:  x   Sudden numbness in arms or legs:     Sudden onset of difficulty speaking or slurred speech:    Temporary loss of vision in one eye:     Problems with dizziness:         Gastrointestinal    Blood in stool:      Vomited blood:         Genitourinary    Burning when urinating:     Blood in urine:        Psychiatric    Major depression:         Hematologic    Bleeding problems:    Problems with blood clotting too easily:        Skin    Rashes or ulcers:        Constitutional    Fever or chills:     PHYSICAL EXAM:   Vitals:   05/07/17 1014 05/07/17 1015  BP: (!) 189/86 (!) 199/83  Pulse: 69   Resp: 16   Temp: 98.5 F (36.9 C)   TempSrc: Oral   SpO2: 96%   Weight: 182 lb (82.6 kg)   Height: 5\' 5"  (1.651 m)     GENERAL: The patient is a well-nourished male, in no acute distress. The vital signs are documented above. CARDIAC: There is a regular rate and rhythm.  VASCULAR: Palpable bilateral popliteal and right dorsalis pedis pulse PULMONARY: Nonlabored respirations ABDOMEN:  Soft and non-tender with normal pitched bowel sounds.  MUSCULOSKELETAL: There are no major deformities or cyanosis. NEUROLOGIC: No focal weakness or paresthesias are detected. SKIN: There are no ulcers or rashes  noted. PSYCHIATRIC: The patient has a normal affect.  STUDIES:   Vascular lab studies were performed today which I have reviewed.  ABIs could not be calculated because of noncompressible vessels.  Digital pressures are 95 on the right and 93 on the left.  Waveforms were triphasic  ASSESSMENT and PLAN   Right leg pain and coolness: Based on a essentially normal vascular lab study today and with a palpable right dorsalis pedis and popliteal pulse, I do not think the patient's symptoms are related to vascular insufficiency.  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  I discussed with the patient to follow-up with neurosurgery as well as Dr. Felipa Eth for further management.   Durene Cal, MD [facility] [phone] Pager [phone]

## 2017-05-10 DIAGNOSIS — H40053 Ocular hypertension, bilateral: Secondary | ICD-10-CM | POA: Diagnosis not present

## 2017-05-10 DIAGNOSIS — Z961 Presence of intraocular lens: Secondary | ICD-10-CM | POA: Diagnosis not present

## 2017-05-10 DIAGNOSIS — H353131 Nonexudative age-related macular degeneration, bilateral, early dry stage: Secondary | ICD-10-CM | POA: Diagnosis not present

## 2017-05-10 DIAGNOSIS — E119 Type 2 diabetes mellitus without complications: Secondary | ICD-10-CM | POA: Diagnosis not present

## 2017-05-16 DIAGNOSIS — Z6830 Body mass index (BMI) 30.0-30.9, adult: Secondary | ICD-10-CM | POA: Diagnosis not present

## 2017-05-16 DIAGNOSIS — I10 Essential (primary) hypertension: Secondary | ICD-10-CM | POA: Diagnosis not present

## 2017-05-16 DIAGNOSIS — M5416 Radiculopathy, lumbar region: Secondary | ICD-10-CM | POA: Diagnosis not present

## 2017-05-16 DIAGNOSIS — M4727 Other spondylosis with radiculopathy, lumbosacral region: Secondary | ICD-10-CM | POA: Diagnosis not present

## 2017-06-23 DIAGNOSIS — Z23 Encounter for immunization: Secondary | ICD-10-CM | POA: Diagnosis not present

## 2017-07-16 DIAGNOSIS — M4727 Other spondylosis with radiculopathy, lumbosacral region: Secondary | ICD-10-CM | POA: Diagnosis not present

## 2017-07-16 DIAGNOSIS — M5417 Radiculopathy, lumbosacral region: Secondary | ICD-10-CM | POA: Diagnosis not present

## 2017-07-18 DIAGNOSIS — M25551 Pain in right hip: Secondary | ICD-10-CM | POA: Diagnosis not present

## 2017-07-18 DIAGNOSIS — M1611 Unilateral primary osteoarthritis, right hip: Secondary | ICD-10-CM | POA: Diagnosis not present

## 2017-07-30 DIAGNOSIS — N183 Chronic kidney disease, stage 3 (moderate): Secondary | ICD-10-CM | POA: Diagnosis not present

## 2017-07-30 DIAGNOSIS — M1990 Unspecified osteoarthritis, unspecified site: Secondary | ICD-10-CM | POA: Diagnosis not present

## 2017-07-30 DIAGNOSIS — I10 Essential (primary) hypertension: Secondary | ICD-10-CM | POA: Diagnosis not present

## 2017-07-30 DIAGNOSIS — N400 Enlarged prostate without lower urinary tract symptoms: Secondary | ICD-10-CM | POA: Diagnosis not present

## 2017-07-30 DIAGNOSIS — E668 Other obesity: Secondary | ICD-10-CM | POA: Diagnosis not present

## 2017-07-30 DIAGNOSIS — E7849 Other hyperlipidemia: Secondary | ICD-10-CM | POA: Diagnosis not present

## 2017-07-30 DIAGNOSIS — E678 Other specified hyperalimentation: Secondary | ICD-10-CM | POA: Diagnosis not present

## 2017-07-30 DIAGNOSIS — Z6829 Body mass index (BMI) 29.0-29.9, adult: Secondary | ICD-10-CM | POA: Diagnosis not present

## 2017-07-30 DIAGNOSIS — D508 Other iron deficiency anemias: Secondary | ICD-10-CM | POA: Diagnosis not present

## 2017-07-30 DIAGNOSIS — E038 Other specified hypothyroidism: Secondary | ICD-10-CM | POA: Diagnosis not present

## 2017-07-30 DIAGNOSIS — E119 Type 2 diabetes mellitus without complications: Secondary | ICD-10-CM | POA: Diagnosis not present

## 2017-08-15 DIAGNOSIS — Z6830 Body mass index (BMI) 30.0-30.9, adult: Secondary | ICD-10-CM | POA: Diagnosis not present

## 2017-08-15 DIAGNOSIS — M5416 Radiculopathy, lumbar region: Secondary | ICD-10-CM | POA: Diagnosis not present

## 2017-08-15 DIAGNOSIS — M4727 Other spondylosis with radiculopathy, lumbosacral region: Secondary | ICD-10-CM | POA: Diagnosis not present

## 2017-08-15 DIAGNOSIS — I10 Essential (primary) hypertension: Secondary | ICD-10-CM | POA: Diagnosis not present

## 2017-10-05 DIAGNOSIS — J3089 Other allergic rhinitis: Secondary | ICD-10-CM | POA: Diagnosis not present

## 2017-10-05 DIAGNOSIS — J301 Allergic rhinitis due to pollen: Secondary | ICD-10-CM | POA: Diagnosis not present

## 2017-10-05 DIAGNOSIS — H1045 Other chronic allergic conjunctivitis: Secondary | ICD-10-CM | POA: Diagnosis not present

## 2017-10-18 DIAGNOSIS — M5416 Radiculopathy, lumbar region: Secondary | ICD-10-CM | POA: Diagnosis not present

## 2017-11-12 DIAGNOSIS — M1611 Unilateral primary osteoarthritis, right hip: Secondary | ICD-10-CM | POA: Diagnosis not present

## 2017-11-14 DIAGNOSIS — M1611 Unilateral primary osteoarthritis, right hip: Secondary | ICD-10-CM | POA: Diagnosis not present

## 2017-11-20 DIAGNOSIS — I10 Essential (primary) hypertension: Secondary | ICD-10-CM | POA: Diagnosis not present

## 2017-11-20 DIAGNOSIS — M4727 Other spondylosis with radiculopathy, lumbosacral region: Secondary | ICD-10-CM | POA: Diagnosis not present

## 2017-11-20 DIAGNOSIS — M5416 Radiculopathy, lumbar region: Secondary | ICD-10-CM | POA: Diagnosis not present

## 2017-11-20 DIAGNOSIS — Z6829 Body mass index (BMI) 29.0-29.9, adult: Secondary | ICD-10-CM | POA: Diagnosis not present

## 2017-12-07 DIAGNOSIS — E1122 Type 2 diabetes mellitus with diabetic chronic kidney disease: Secondary | ICD-10-CM | POA: Diagnosis not present

## 2017-12-07 DIAGNOSIS — Z1389 Encounter for screening for other disorder: Secondary | ICD-10-CM | POA: Diagnosis not present

## 2017-12-07 DIAGNOSIS — G4736 Sleep related hypoventilation in conditions classified elsewhere: Secondary | ICD-10-CM | POA: Diagnosis not present

## 2017-12-07 DIAGNOSIS — E668 Other obesity: Secondary | ICD-10-CM | POA: Diagnosis not present

## 2017-12-07 DIAGNOSIS — I10 Essential (primary) hypertension: Secondary | ICD-10-CM | POA: Diagnosis not present

## 2017-12-07 DIAGNOSIS — F39 Unspecified mood [affective] disorder: Secondary | ICD-10-CM | POA: Diagnosis not present

## 2017-12-07 DIAGNOSIS — N183 Chronic kidney disease, stage 3 (moderate): Secondary | ICD-10-CM | POA: Diagnosis not present

## 2017-12-07 DIAGNOSIS — Z6829 Body mass index (BMI) 29.0-29.9, adult: Secondary | ICD-10-CM | POA: Diagnosis not present

## 2018-01-17 DIAGNOSIS — M5416 Radiculopathy, lumbar region: Secondary | ICD-10-CM | POA: Diagnosis not present

## 2018-02-20 DIAGNOSIS — M1611 Unilateral primary osteoarthritis, right hip: Secondary | ICD-10-CM | POA: Diagnosis not present

## 2018-05-10 DIAGNOSIS — M25551 Pain in right hip: Secondary | ICD-10-CM | POA: Diagnosis not present

## 2018-05-14 DIAGNOSIS — M1611 Unilateral primary osteoarthritis, right hip: Secondary | ICD-10-CM | POA: Diagnosis not present

## 2018-05-15 DIAGNOSIS — M1611 Unilateral primary osteoarthritis, right hip: Secondary | ICD-10-CM | POA: Diagnosis not present

## 2018-05-29 DIAGNOSIS — M1611 Unilateral primary osteoarthritis, right hip: Secondary | ICD-10-CM | POA: Diagnosis not present

## 2018-06-24 DIAGNOSIS — Z961 Presence of intraocular lens: Secondary | ICD-10-CM | POA: Diagnosis not present

## 2018-06-24 DIAGNOSIS — H40053 Ocular hypertension, bilateral: Secondary | ICD-10-CM | POA: Diagnosis not present

## 2018-06-24 DIAGNOSIS — E119 Type 2 diabetes mellitus without complications: Secondary | ICD-10-CM | POA: Diagnosis not present

## 2018-06-24 DIAGNOSIS — H353131 Nonexudative age-related macular degeneration, bilateral, early dry stage: Secondary | ICD-10-CM | POA: Diagnosis not present

## 2018-08-13 DIAGNOSIS — M5416 Radiculopathy, lumbar region: Secondary | ICD-10-CM | POA: Diagnosis not present

## 2018-08-29 DIAGNOSIS — H903 Sensorineural hearing loss, bilateral: Secondary | ICD-10-CM | POA: Diagnosis not present

## 2018-08-29 DIAGNOSIS — H6123 Impacted cerumen, bilateral: Secondary | ICD-10-CM | POA: Diagnosis not present

## 2018-09-03 DIAGNOSIS — H903 Sensorineural hearing loss, bilateral: Secondary | ICD-10-CM | POA: Diagnosis not present

## 2018-09-11 DIAGNOSIS — M1990 Unspecified osteoarthritis, unspecified site: Secondary | ICD-10-CM | POA: Diagnosis not present

## 2018-09-11 DIAGNOSIS — N400 Enlarged prostate without lower urinary tract symptoms: Secondary | ICD-10-CM | POA: Diagnosis not present

## 2018-09-11 DIAGNOSIS — N3281 Overactive bladder: Secondary | ICD-10-CM | POA: Diagnosis not present

## 2018-09-11 DIAGNOSIS — R05 Cough: Secondary | ICD-10-CM | POA: Diagnosis not present

## 2018-09-11 DIAGNOSIS — E1122 Type 2 diabetes mellitus with diabetic chronic kidney disease: Secondary | ICD-10-CM | POA: Diagnosis not present

## 2018-09-11 DIAGNOSIS — N183 Chronic kidney disease, stage 3 (moderate): Secondary | ICD-10-CM | POA: Diagnosis not present

## 2018-09-11 DIAGNOSIS — E039 Hypothyroidism, unspecified: Secondary | ICD-10-CM | POA: Diagnosis not present

## 2018-09-11 DIAGNOSIS — I10 Essential (primary) hypertension: Secondary | ICD-10-CM | POA: Diagnosis not present

## 2018-09-11 DIAGNOSIS — J302 Other seasonal allergic rhinitis: Secondary | ICD-10-CM | POA: Diagnosis not present

## 2018-09-11 DIAGNOSIS — Z6828 Body mass index (BMI) 28.0-28.9, adult: Secondary | ICD-10-CM | POA: Diagnosis not present

## 2018-09-11 DIAGNOSIS — D509 Iron deficiency anemia, unspecified: Secondary | ICD-10-CM | POA: Diagnosis not present

## 2018-10-30 DIAGNOSIS — I129 Hypertensive chronic kidney disease with stage 1 through stage 4 chronic kidney disease, or unspecified chronic kidney disease: Secondary | ICD-10-CM | POA: Diagnosis not present

## 2018-10-30 DIAGNOSIS — N183 Chronic kidney disease, stage 3 (moderate): Secondary | ICD-10-CM | POA: Diagnosis not present

## 2018-10-30 DIAGNOSIS — Z6828 Body mass index (BMI) 28.0-28.9, adult: Secondary | ICD-10-CM | POA: Diagnosis not present

## 2018-10-30 DIAGNOSIS — Z0289 Encounter for other administrative examinations: Secondary | ICD-10-CM | POA: Diagnosis not present

## 2019-04-07 ENCOUNTER — Other Ambulatory Visit: Payer: Self-pay | Admitting: Physical Medicine and Rehabilitation

## 2019-04-07 ENCOUNTER — Other Ambulatory Visit: Payer: Self-pay | Admitting: Chiropractic Medicine

## 2019-04-07 DIAGNOSIS — M1611 Unilateral primary osteoarthritis, right hip: Secondary | ICD-10-CM

## 2019-04-07 DIAGNOSIS — M25551 Pain in right hip: Secondary | ICD-10-CM

## 2019-04-09 ENCOUNTER — Other Ambulatory Visit: Payer: Medicare Other

## 2019-04-15 ENCOUNTER — Other Ambulatory Visit: Payer: Medicare Other

## 2019-05-06 ENCOUNTER — Other Ambulatory Visit: Payer: Self-pay

## 2019-05-06 ENCOUNTER — Ambulatory Visit
Admission: RE | Admit: 2019-05-06 | Discharge: 2019-05-06 | Disposition: A | Discharge status: Home / Self Care | Payer: Medicare Other | Source: Ambulatory Visit | Attending: Chiropractic Medicine | Admitting: Chiropractic Medicine

## 2019-05-06 DIAGNOSIS — M25551 Pain in right hip: Secondary | ICD-10-CM | POA: Diagnosis not present

## 2019-05-09 ENCOUNTER — Other Ambulatory Visit: Payer: Medicare Other

## 2019-05-12 ENCOUNTER — Other Ambulatory Visit: Payer: Medicare Other

## 2019-05-14 ENCOUNTER — Ambulatory Visit
Admission: RE | Admit: 2019-05-14 | Discharge: 2019-05-14 | Disposition: A | Discharge status: Home / Self Care | Payer: Medicare Other | Source: Ambulatory Visit | Attending: Physical Medicine and Rehabilitation | Admitting: Physical Medicine and Rehabilitation

## 2019-05-14 ENCOUNTER — Other Ambulatory Visit: Payer: Self-pay

## 2019-05-14 DIAGNOSIS — M1611 Unilateral primary osteoarthritis, right hip: Secondary | ICD-10-CM | POA: Diagnosis not present

## 2019-05-14 MED ORDER — METHYLPREDNISOLONE ACETATE 40 MG/ML INJ SUSP (RADIOLOG
120.0000 mg | Freq: Once | INTRAMUSCULAR | Status: AC
  Administered 2019-05-14: 120 mg via INTRA_ARTICULAR

## 2019-05-14 MED ORDER — IOPAMIDOL (ISOVUE-M 200) INJECTION 41%
1.0000 mL | Freq: Once | INTRAMUSCULAR | Status: AC
  Administered 2019-05-14: 1 mL via INTRA_ARTICULAR

## 2019-06-04 DIAGNOSIS — Z6826 Body mass index (BMI) 26.0-26.9, adult: Secondary | ICD-10-CM | POA: Diagnosis not present

## 2019-06-04 DIAGNOSIS — I10 Essential (primary) hypertension: Secondary | ICD-10-CM | POA: Diagnosis not present

## 2019-06-04 DIAGNOSIS — M4727 Other spondylosis with radiculopathy, lumbosacral region: Secondary | ICD-10-CM | POA: Diagnosis not present

## 2019-06-04 DIAGNOSIS — M5416 Radiculopathy, lumbar region: Secondary | ICD-10-CM | POA: Diagnosis not present

## 2019-06-05 DIAGNOSIS — Z23 Encounter for immunization: Secondary | ICD-10-CM | POA: Diagnosis not present

## 2019-06-10 ENCOUNTER — Emergency Department (HOSPITAL_COMMUNITY)
Admission: EM | Admit: 2019-06-10 | Discharge: 2019-06-10 | Disposition: A | Discharge status: Home / Self Care | Payer: Medicare Other | Attending: Emergency Medicine | Admitting: Emergency Medicine

## 2019-06-10 ENCOUNTER — Other Ambulatory Visit: Payer: Self-pay

## 2019-06-10 ENCOUNTER — Emergency Department (HOSPITAL_COMMUNITY): Payer: Medicare Other

## 2019-06-10 ENCOUNTER — Encounter (HOSPITAL_COMMUNITY): Payer: Self-pay

## 2019-06-10 DIAGNOSIS — Y999 Unspecified external cause status: Secondary | ICD-10-CM | POA: Diagnosis not present

## 2019-06-10 DIAGNOSIS — Z79899 Other long term (current) drug therapy: Secondary | ICD-10-CM | POA: Diagnosis not present

## 2019-06-10 DIAGNOSIS — E039 Hypothyroidism, unspecified: Secondary | ICD-10-CM | POA: Diagnosis not present

## 2019-06-10 DIAGNOSIS — E119 Type 2 diabetes mellitus without complications: Secondary | ICD-10-CM | POA: Diagnosis not present

## 2019-06-10 DIAGNOSIS — S79911A Unspecified injury of right hip, initial encounter: Secondary | ICD-10-CM | POA: Diagnosis not present

## 2019-06-10 DIAGNOSIS — S0993XA Unspecified injury of face, initial encounter: Secondary | ICD-10-CM | POA: Diagnosis not present

## 2019-06-10 DIAGNOSIS — S0181XA Laceration without foreign body of other part of head, initial encounter: Secondary | ICD-10-CM | POA: Insufficient documentation

## 2019-06-10 DIAGNOSIS — Y9301 Activity, walking, marching and hiking: Secondary | ICD-10-CM | POA: Diagnosis not present

## 2019-06-10 DIAGNOSIS — R58 Hemorrhage, not elsewhere classified: Secondary | ICD-10-CM | POA: Diagnosis not present

## 2019-06-10 DIAGNOSIS — W19XXXA Unspecified fall, initial encounter: Secondary | ICD-10-CM

## 2019-06-10 DIAGNOSIS — M25551 Pain in right hip: Secondary | ICD-10-CM | POA: Diagnosis not present

## 2019-06-10 DIAGNOSIS — I10 Essential (primary) hypertension: Secondary | ICD-10-CM | POA: Diagnosis not present

## 2019-06-10 DIAGNOSIS — Y92008 Other place in unspecified non-institutional (private) residence as the place of occurrence of the external cause: Secondary | ICD-10-CM | POA: Insufficient documentation

## 2019-06-10 DIAGNOSIS — R519 Headache, unspecified: Secondary | ICD-10-CM | POA: Insufficient documentation

## 2019-06-10 DIAGNOSIS — R112 Nausea with vomiting, unspecified: Secondary | ICD-10-CM | POA: Diagnosis not present

## 2019-06-10 DIAGNOSIS — W010XXA Fall on same level from slipping, tripping and stumbling without subsequent striking against object, initial encounter: Secondary | ICD-10-CM | POA: Diagnosis not present

## 2019-06-10 DIAGNOSIS — J90 Pleural effusion, not elsewhere classified: Secondary | ICD-10-CM | POA: Diagnosis not present

## 2019-06-10 DIAGNOSIS — R52 Pain, unspecified: Secondary | ICD-10-CM | POA: Diagnosis not present

## 2019-06-10 LAB — BASIC METABOLIC PANEL
Anion gap: 10 (ref 5–15)
BUN: 29 mg/dL — ABNORMAL HIGH (ref 8–23)
CO2: 24 mmol/L (ref 22–32)
Calcium: 8.9 mg/dL (ref 8.9–10.3)
Chloride: 103 mmol/L (ref 98–111)
Creatinine, Ser: 1.24 mg/dL (ref 0.61–1.24)
GFR calc Af Amer: 57 mL/min — ABNORMAL LOW (ref >60)
GFR calc non Af Amer: 49 mL/min — ABNORMAL LOW (ref >60)
Glucose, Bld: 147 mg/dL — ABNORMAL HIGH (ref 70–99)
Potassium: 4.6 mmol/L (ref 3.5–5.1)
Sodium: 137 mmol/L (ref 135–145)

## 2019-06-10 LAB — CBC WITH DIFFERENTIAL/PLATELET
Abs Immature Granulocytes: 0.03 10*3/uL (ref 0.00–0.07)
Basophils Absolute: 0 10*3/uL (ref 0.0–0.1)
Basophils Relative: 0 %
Eosinophils Absolute: 0.1 10*3/uL (ref 0.0–0.5)
Eosinophils Relative: 1 %
HCT: 30 % — ABNORMAL LOW (ref 39.0–52.0)
Hemoglobin: 9.8 g/dL — ABNORMAL LOW (ref 13.0–17.0)
Immature Granulocytes: 1 %
Lymphocytes Relative: 12 %
Lymphs Abs: 0.7 10*3/uL (ref 0.7–4.0)
MCH: 35.8 pg — ABNORMAL HIGH (ref 26.0–34.0)
MCHC: 32.7 g/dL (ref 30.0–36.0)
MCV: 109.5 fL — ABNORMAL HIGH (ref 80.0–100.0)
Monocytes Absolute: 0.3 10*3/uL (ref 0.1–1.0)
Monocytes Relative: 5 %
Neutro Abs: 4.6 10*3/uL (ref 1.7–7.7)
Neutrophils Relative %: 81 %
Platelets: 175 10*3/uL (ref 150–400)
RBC: 2.74 MIL/uL — ABNORMAL LOW (ref 4.22–5.81)
RDW: 11.9 % (ref 11.5–15.5)
WBC: 5.7 10*3/uL (ref 4.0–10.5)
nRBC: 0 % (ref 0.0–0.2)

## 2019-06-10 LAB — TROPONIN I (HIGH SENSITIVITY)
Troponin I (High Sensitivity): 18 ng/L — ABNORMAL HIGH (ref <18)
Troponin I (High Sensitivity): 25 ng/L — ABNORMAL HIGH (ref <18)

## 2019-06-10 MED ORDER — LIDOCAINE-EPINEPHRINE (PF) 2 %-1:200000 IJ SOLN: 10.0000 mL | Freq: Once | INTRAMUSCULAR | Status: DC

## 2019-06-10 MED ORDER — ONDANSETRON 4 MG PO TBDP
4.0000 mg | ORAL_TABLET | Freq: Once | ORAL | Status: AC
  Administered 2019-06-10: 4 mg via ORAL
  Filled 2019-06-10: qty 1

## 2019-06-10 MED ORDER — LIDOCAINE-EPINEPHRINE-TETRACAINE (LET) SOLUTION
3.0000 mL | Freq: Once | NASAL | Status: AC
  Administered 2019-06-10: 3 mL via TOPICAL
  Filled 2019-06-10: qty 3

## 2019-06-10 MED ORDER — LIDOCAINE-EPINEPHRINE 2 %-1:200000 IJ SOLN
10.0000 mL | Freq: Once | INTRAMUSCULAR | Status: AC
Start: 2019-06-10 — End: 2019-06-10
  Administered 2019-06-10: 10 mL
  Filled 2019-06-10: qty 10

## 2019-06-10 MED ORDER — BACITRACIN ZINC 500 UNIT/GM EX OINT
TOPICAL_OINTMENT | Freq: Every day | CUTANEOUS | Status: DC
  Administered 2019-06-10: 1 via TOPICAL
  Filled 2019-06-10: qty 0.9

## 2019-06-10 MED ORDER — ONDANSETRON 8 MG PO TBDP: 8.0000 mg | ORAL_TABLET | Freq: Once | ORAL | Status: DC

## 2019-06-10 NOTE — ED Notes (Signed)
Patient transported to CT 

## 2019-06-10 NOTE — Discharge Instructions (Addendum)
Have staples removed in 1 week.  Clean laceration twice a day gently with soap and water.  Follow-up with your doctor if any problems

## 2019-06-10 NOTE — ED Provider Notes (Signed)
[facility]-EMERGENCY DEPT Provider Note   CSN: [id number] Arrival date & time: 06/10/19  1545     History   Chief Complaint No chief complaint on file.   HPI Ricky Sandoval is a 85 y.o. male.     85 year old male with past medical history of diabetes, hypertension, hyperlipidemia presents with injuries from a fall.  Patient states that he was going down a ramp doing some work at the house when his right leg gave out on him and he fell.  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 Baseline pain in his right hip, right wrist is mildly sore with abrasions.  Patient is not anticoagulated, no loss of consciousness, denies feeling weak or dizzy prior to his fall.  No other injuries, complaints, concerns.     Past Medical History:  Diagnosis Date   Diabetes type 2, controlled (HCC)    High cholesterol    Hypertension    Hypothyroid     There are no active problems to display for this patient.   History reviewed. No pertinent surgical history.      Home Medications    Prior to Admission medications   Medication Sig Start Date End Date Taking? Authorizing Provider  acetaminophen (ARTHRITIS PAIN RELIEF) 650 MG CR tablet Take 1,300 mg by mouth 2 (two) times daily.    [provider]  aspirin 81 MG tablet Take 81 mg by mouth daily.    [provider]  caffeine (NO DOZ MAXIMUM STRENGTH) 200 MG TABS tablet Take 400 mg by mouth every morning.    [provider]  Coenzyme Q10-Fish Oil-Vit E (CO-Q 10 OMEGA-3 FISH OIL PO) Take 2 tablets by mouth daily.    [provider]  diphenhydramine-acetaminophen (TYLENOL PM) 25-500 MG TABS Take 2 tablets by mouth at bedtime as needed (sleep).    [provider]  docusate sodium (COLACE) 100 MG capsule Take 300 mg by mouth daily.    [provider]  ferrous sulfate 325 (65 FE) MG tablet Take 325 mg by mouth daily with breakfast.    [provider]  flunisolide (NASALIDE) 25 MCG/ACT (0.025%) SOLN Place 2 sprays into the nose 2 (two) times daily as needed (allergies).    [provider]  gabapentin (NEURONTIN) 100 MG capsule Take 100 mg by mouth at bedtime.    [provider]  Ginkgo Biloba 60 MG CAPS Take 3 capsules by mouth 2 (two) times daily.    [provider]  Homeopathic Products (LEG CRAMP RELIEF PO) Take 1 tablet by mouth daily as needed (leg cramps). Leg cramps/quinine    [provider]  KRILL OIL PO Take 500 mg by mouth daily.    [provider]  levothyroxine (SYNTHROID, LEVOTHROID) 75 MCG tablet Take 75 mcg by mouth daily before breakfast.    [provider]  loratadine (CLARITIN) 10 MG tablet Take 10 mg by mouth daily.    [provider]  losartan (COZAAR) 100 MG tablet Take 100 mg by mouth 2 (two) times daily.    [provider]  Lutein 20 MG CAPS Take 1 capsule by mouth 2 (two) times daily.    [provider]  Misc Natural Products (OSTEO BI-FLEX JOINT SHIELD PO) Take 1 tablet by mouth 2 (two) times daily.    [provider]  Multiple Vitamins-Minerals (CENTRUM SILVER PO) Take 1 tablet by mouth daily.    [provider]  omeprazole (PRILOSEC) 20 MG capsule Take 20 mg by mouth daily as needed (indigestion/heartburn).    [provider]  pyridOXINE (VITAMIN B-6) 100 MG tablet Take 100 mg by mouth daily.    [provider]  simvastatin (ZOCOR) 40 MG tablet Take 20 mg by mouth at bedtime.    [provider]  tamsulosin (FLOMAX) 0.4 MG CAPS capsule Take 0.4 mg by mouth at bedtime.    [provider]  traMADol (ULTRAM) 50 MG tablet Take by mouth every 6 (six) hours as needed.    [provider]  vitamin B-12 (CYANOCOBALAMIN) 1000 MCG tablet Take 1,000 mcg by mouth  daily.    [provider]  vitamin C (ASCORBIC ACID) 500 MG tablet Take 500 mg by mouth daily.    [provider]    Family History History reviewed. No pertinent family history.  Social History Social History   Tobacco Use   Smoking status: Not on file  Substance Use Topics   Alcohol use: Not on file   Drug use: Not on file     Allergies   Patient has no known allergies.   Review of Systems Review of Systems  Constitutional: Negative for fever.  Eyes: Negative for visual disturbance.  Respiratory: Negative for shortness of breath.   Cardiovascular: Negative for chest pain.  Musculoskeletal: Positive for arthralgias, gait problem and myalgias. Negative for back pain, joint swelling, neck pain and neck stiffness.  Skin: Positive for wound.  Allergic/Immunologic: Positive for immunocompromised state.  Neurological: Positive for headaches. Negative for dizziness and weakness.  Hematological: Does not bruise/bleed easily.  Psychiatric/Behavioral: Negative for confusion.  All other systems reviewed and are negative.    Physical Exam Updated Vital Signs BP (!) 165/66    Pulse 77    Temp 98.2 F (36.8 C) (Oral)    Resp 16    SpO2 92%   Physical Exam Vitals signs and nursing note reviewed.  Constitutional:      General: He is not in acute distress.    Appearance: He is well-developed. He is not diaphoretic.  HENT:     Head: Normocephalic.      Nose: Nose normal.     Mouth/Throat:     Mouth: Mucous membranes are moist.  Eyes:     Extraocular Movements: Extraocular movements intact.     Pupils: Pupils are equal, round, and reactive to light.  Cardiovascular:     Rate and Rhythm: Normal rate and regular rhythm.     Pulses: Normal pulses.     Heart sounds: Normal heart sounds.  Pulmonary:     Effort: Pulmonary effort is normal.     Breath sounds: Normal breath sounds.  Abdominal:     Tenderness: There is no abdominal tenderness.    Musculoskeletal:        General: Signs of injury present. No swelling, tenderness or deformity.     Right wrist: He exhibits normal range of motion, no tenderness and no bony tenderness.     Left hip: Normal.     Cervical back: Normal.       Arms:     Right lower leg: No edema.     Left lower leg: No edema.       Legs:  Skin:    General: Skin is warm and dry.     Findings: No erythema or rash.  Neurological:  Mental Status: He is alert and oriented to person, place, and time.  Psychiatric:        Behavior: Behavior normal.      ED Treatments / Results  Labs (all labs ordered are listed, but only abnormal results are displayed) Labs Reviewed  CBC WITH DIFFERENTIAL/PLATELET - Abnormal; Notable for the following components:      Result Value   RBC 2.74 (*)    Hemoglobin 9.8 (*)    HCT 30.0 (*)    MCV 109.5 (*)    MCH 35.8 (*)    All other components within normal limits  BASIC METABOLIC PANEL - Abnormal; Notable for the following components:   Glucose, Bld 147 (*)    BUN 29 (*)    GFR calc non Af Amer 49 (*)    GFR calc Af Amer 57 (*)    All other components within normal limits  TROPONIN I (HIGH SENSITIVITY) - Abnormal; Notable for the following components:   Troponin I (High Sensitivity) 18 (*)    All other components within normal limits  TROPONIN I (HIGH SENSITIVITY)    EKG EKG Interpretation  Date/Time:  Tuesday June 10 2019 18:58:14 EDT Ventricular Rate:  80 PR Interval:  236 QRS Duration: 98 QT Interval:  402 QTC Calculation: 463 R Axis:   36 Text Interpretation:  Sinus rhythm with 1st degree A-V block Cannot rule out Anterior infarct , age undetermined T wave abnormality, consider inferior ischemia Abnormal ECG Confirmed by Bethann Berkshire [id number]) on 06/10/2019 8:51:45 PM   Radiology Ct Head Wo Contrast  Result Date: 06/10/2019 CLINICAL DATA:  85 year old male with facial trauma. EXAM: CT HEAD WITHOUT CONTRAST CT MAXILLOFACIAL WITHOUT  CONTRAST TECHNIQUE: Multidetector CT imaging of the head and maxillofacial structures were performed using the standard protocol without intravenous contrast. Multiplanar CT image reconstructions of the maxillofacial structures were also generated. COMPARISON:  None. FINDINGS: CT HEAD FINDINGS Brain: There is moderate age-related atrophy and chronic microvascular ischemic changes. There is no acute intracranial hemorrhage. No mass effect or midline shift. No extra-axial fluid collection. Vascular: No hyperdense vessel or unexpected calcification. Skull: Normal. Negative for fracture or focal lesion. Other: Right forehead skin laceration. No large hematoma. CT MAXILLOFACIAL FINDINGS Osseous: No acute fracture. No mandibular subluxation. Degenerative changes of the left TMJ. Orbits: The globes and retro-orbital fat are preserved. Sinuses: Clear. Soft tissues: Negative. IMPRESSION: 1. No acute intracranial hemorrhage. 2. No acute/traumatic facial bone fractures. Electronically Signed   By: Elgie Collard M.D.   On: 06/10/2019 18:19   Dg Hip Unilat With Pelvis 2-3 Views Right  Result Date: 06/10/2019 CLINICAL DATA:  85 year old male with fall and right hip pain. EXAM: DG HIP (WITH OR WITHOUT PELVIS) 2-3V RIGHT COMPARISON:  Pelvic radiograph dated 11/03/2015 FINDINGS: There is no acute fracture or dislocation. There is severe arthritic changes of the right hip with severe narrowing of the femoroacetabular joint space and near bone-on-bone contact superiorly. There is subcortical cystic changes of the right femoral head and acetabulum. Degenerative changes of the lower lumbar spine. The soft tissues are unremarkable. Vascular calcifications noted. IMPRESSION: 1. No acute fracture or dislocation. 2. Severe arthritic changes of the right hip, significantly progressed since the radiograph of 2017. Electronically Signed   By: Elgie Collard M.D.   On: 06/10/2019 18:21   Ct Maxillofacial Wo Cm  Result Date:  06/10/2019 CLINICAL DATA:  85 year old male with facial trauma. EXAM: CT HEAD WITHOUT CONTRAST CT MAXILLOFACIAL WITHOUT CONTRAST TECHNIQUE: Multidetector CT imaging of the  head and maxillofacial structures were performed using the standard protocol without intravenous contrast. Multiplanar CT image reconstructions of the maxillofacial structures were also generated. COMPARISON:  None. FINDINGS: CT HEAD FINDINGS Brain: There is moderate age-related atrophy and chronic microvascular ischemic changes. There is no acute intracranial hemorrhage. No mass effect or midline shift. No extra-axial fluid collection. Vascular: No hyperdense vessel or unexpected calcification. Skull: Normal. Negative for fracture or focal lesion. Other: Right forehead skin laceration. No large hematoma. CT MAXILLOFACIAL FINDINGS Osseous: No acute fracture. No mandibular subluxation. Degenerative changes of the left TMJ. Orbits: The globes and retro-orbital fat are preserved. Sinuses: Clear. Soft tissues: Negative. IMPRESSION: 1. No acute intracranial hemorrhage. 2. No acute/traumatic facial bone fractures. Electronically Signed   By: Elgie Collard M.D.   On: 06/10/2019 18:19    Procedures .Marland KitchenLaceration Repair  Date/Time: 06/10/2019 7:08 PM Performed by: Jeannie Fend, PA-C Authorized by: Jeannie Fend, PA-C   Consent:    Consent obtained:  Verbal   Consent given by:  Patient   Risks discussed:  Infection, need for additional repair, pain, poor cosmetic result and poor wound healing   Alternatives discussed:  No treatment and delayed treatment Universal protocol:    Procedure explained and questions answered to patient or proxy's satisfaction: yes     Relevant documents present and verified: yes     Test results available and properly labeled: yes     Imaging studies available: yes     Required blood products, implants, devices, and special equipment available: yes     Site/side marked: yes     Immediately prior to  procedure, a time out was called: yes     Patient identity confirmed:  Verbally with patient Anesthesia (see MAR for exact dosages):    Anesthesia method:  Local infiltration and topical application   Topical anesthetic:  LET   Local anesthetic:  Lidocaine 1% WITH epi Laceration details:    Location:  Face   Face location:  Forehead   Length (cm):  4   Depth (mm):  5 Repair type:    Repair type:  Simple Pre-procedure details:    Preparation:  Patient was prepped and draped in usual sterile fashion and imaging obtained to evaluate for foreign bodies Exploration:    Hemostasis achieved with:  LET and epinephrine   Wound exploration: wound explored through full range of motion and entire depth of wound probed and visualized     Wound extent: foreign bodies/material     Wound extent: no underlying fracture noted     Foreign bodies/material:  Few small particles of dirt   Contaminated: yes   Treatment:    Area cleansed with:  Saline   Amount of cleaning:  Extensive   Irrigation solution:  Sterile saline Skin repair:    Repair method:  Staples   Number of staples:  4 Approximation:    Approximation:  Close Post-procedure details:    Dressing:  Open (no dressing)   Patient tolerance of procedure:  Tolerated well, no immediate complications   (including critical care time)  Medications Ordered in ED Medications  bacitracin ointment (1 application Topical Given 06/10/19 1936)  lidocaine-EPINEPHrine-tetracaine (LET) solution (3 mLs Topical Given 06/10/19 1800)  lidocaine-EPINEPHrine (XYLOCAINE W/EPI) 2 %-1:200000 (PF) injection 10 mL (10 mLs Infiltration Given 06/10/19 1801)  ondansetron (ZOFRAN-ODT) disintegrating tablet 4 mg (4 mg Oral Given 06/10/19 1849)     Initial Impression / Assessment and Plan / ED Course  I have reviewed the triage  vital signs and the nursing notes.  Pertinent labs & imaging results that were available during my care of the patient were reviewed by me  and considered in my medical decision making (see chart for details).  Clinical Course as of Jun 09 2052  Tue Jun 09, 6950  375 85 year old male presents with injuries from a fall.  Patient states that he was going down a ramp today doing some work at home when his right leg gave out on him.  Patient states this happens periodically due to severe arthritis in his right hip.  Patient denies hitting his head or loss of consciousness, is not anticoagulated, denies feeling weak or dizzy prior to the fall.  Patient reports minor pain in his right hip, right wrist abrasions without pain moving his wrist, abrasions to his right forehead area with a laceration with pain around his right eye as well slight headache.  CT of the head and maxillofacial areas are unremarkable for significant findings.   [LM]  2034 Case discussed with Dr. Roderic Palau, ER attending who has seen the patient.   [LM]  2034 Forehead wound was thoroughly irrigated and closed with staples. After patient was seen by Dr. Roderic Palau, he had an episode of vomiting. EKG and labs ordered for further evaluation. Patient states he is feeling fine. Vomiting is controlled with Zofran.    [LM]  2035 Labs show anemia with hgb 9.8, similar to previousl. BMP without significant changes. Troponin x 1 elevated at 18.    [LM]  2053 Care signed out to Dr. Roderic Palau awaiting repeat troponin.   [LM]    Clinical Course User Index [LM] Tacy Learn, PA-C        Final Clinical Impressions(s) / ED Diagnoses   Final diagnoses:  Fall, initial encounter  Facial laceration, initial encounter  Non-intractable vomiting with nausea, unspecified vomiting type    ED Discharge Orders    None       Roque Lias 06/10/19 2053    Milton Ferguson, MD 06/14/19 1018

## 2019-06-10 NOTE — ED Triage Notes (Signed)
Pt BIB EMS from home. Pt fell outside and reports that his right leg gave out. Pt did hit head and has lac above right eye. Bleeding controlled at this time. Denies blood thinners and LOC. Pt reports chronic right leg and knee pain.

## 2019-06-10 NOTE — ED Notes (Signed)
Per PA wait to apply LET until patient returns from CT

## 2019-06-13 ENCOUNTER — Other Ambulatory Visit: Payer: Self-pay | Admitting: Chiropractic Medicine

## 2019-06-13 DIAGNOSIS — M5416 Radiculopathy, lumbar region: Secondary | ICD-10-CM

## 2019-06-17 ENCOUNTER — Other Ambulatory Visit: Payer: Self-pay

## 2019-06-17 ENCOUNTER — Ambulatory Visit
Admission: RE | Admit: 2019-06-17 | Discharge: 2019-06-17 | Disposition: A | Discharge status: Home / Self Care | Payer: Medicare Other | Source: Ambulatory Visit | Attending: Chiropractic Medicine | Admitting: Chiropractic Medicine

## 2019-06-17 DIAGNOSIS — M5416 Radiculopathy, lumbar region: Secondary | ICD-10-CM

## 2019-06-17 DIAGNOSIS — M545 Low back pain: Secondary | ICD-10-CM | POA: Diagnosis not present

## 2019-06-17 MED ORDER — METHYLPREDNISOLONE ACETATE 40 MG/ML INJ SUSP (RADIOLOG
120.0000 mg | Freq: Once | INTRAMUSCULAR | Status: AC
  Administered 2019-06-17: 120 mg via EPIDURAL

## 2019-06-17 MED ORDER — IOPAMIDOL (ISOVUE-M 200) INJECTION 41%
1.0000 mL | Freq: Once | INTRAMUSCULAR | Status: AC
  Administered 2019-06-17: 1 mL via EPIDURAL

## 2019-06-18 DIAGNOSIS — Z4802 Encounter for removal of sutures: Secondary | ICD-10-CM | POA: Diagnosis not present

## 2019-06-18 DIAGNOSIS — S0191XA Laceration without foreign body of unspecified part of head, initial encounter: Secondary | ICD-10-CM | POA: Diagnosis not present

## 2019-06-25 DIAGNOSIS — H353131 Nonexudative age-related macular degeneration, bilateral, early dry stage: Secondary | ICD-10-CM | POA: Diagnosis not present

## 2019-06-25 DIAGNOSIS — H40053 Ocular hypertension, bilateral: Secondary | ICD-10-CM | POA: Diagnosis not present

## 2019-06-25 DIAGNOSIS — E119 Type 2 diabetes mellitus without complications: Secondary | ICD-10-CM | POA: Diagnosis not present

## 2019-06-25 DIAGNOSIS — Z961 Presence of intraocular lens: Secondary | ICD-10-CM | POA: Diagnosis not present

## 2019-07-30 DIAGNOSIS — E1122 Type 2 diabetes mellitus with diabetic chronic kidney disease: Secondary | ICD-10-CM | POA: Diagnosis not present

## 2019-07-30 DIAGNOSIS — E669 Obesity, unspecified: Secondary | ICD-10-CM | POA: Diagnosis not present

## 2019-07-30 DIAGNOSIS — I129 Hypertensive chronic kidney disease with stage 1 through stage 4 chronic kidney disease, or unspecified chronic kidney disease: Secondary | ICD-10-CM | POA: Diagnosis not present

## 2019-07-30 DIAGNOSIS — F39 Unspecified mood [affective] disorder: Secondary | ICD-10-CM | POA: Diagnosis not present

## 2019-07-30 DIAGNOSIS — E039 Hypothyroidism, unspecified: Secondary | ICD-10-CM | POA: Diagnosis not present

## 2019-07-30 DIAGNOSIS — R2689 Other abnormalities of gait and mobility: Secondary | ICD-10-CM | POA: Diagnosis not present

## 2019-07-30 DIAGNOSIS — D509 Iron deficiency anemia, unspecified: Secondary | ICD-10-CM | POA: Diagnosis not present

## 2019-07-30 DIAGNOSIS — G4736 Sleep related hypoventilation in conditions classified elsewhere: Secondary | ICD-10-CM | POA: Diagnosis not present

## 2019-07-30 DIAGNOSIS — N183 Chronic kidney disease, stage 3 unspecified: Secondary | ICD-10-CM | POA: Diagnosis not present

## 2019-07-30 DIAGNOSIS — N400 Enlarged prostate without lower urinary tract symptoms: Secondary | ICD-10-CM | POA: Diagnosis not present

## 2019-07-30 DIAGNOSIS — M1990 Unspecified osteoarthritis, unspecified site: Secondary | ICD-10-CM | POA: Diagnosis not present

## 2019-09-23 DIAGNOSIS — G8929 Other chronic pain: Secondary | ICD-10-CM | POA: Diagnosis not present

## 2019-09-23 DIAGNOSIS — M1611 Unilateral primary osteoarthritis, right hip: Secondary | ICD-10-CM | POA: Diagnosis not present

## 2019-11-04 DIAGNOSIS — M5136 Other intervertebral disc degeneration, lumbar region: Secondary | ICD-10-CM | POA: Diagnosis not present

## 2019-11-04 DIAGNOSIS — M1611 Unilateral primary osteoarthritis, right hip: Secondary | ICD-10-CM | POA: Diagnosis not present

## 2019-12-22 DIAGNOSIS — E039 Hypothyroidism, unspecified: Secondary | ICD-10-CM | POA: Diagnosis not present

## 2019-12-22 DIAGNOSIS — R2689 Other abnormalities of gait and mobility: Secondary | ICD-10-CM | POA: Diagnosis not present

## 2019-12-22 DIAGNOSIS — Z1389 Encounter for screening for other disorder: Secondary | ICD-10-CM | POA: Diagnosis not present

## 2019-12-22 DIAGNOSIS — F39 Unspecified mood [affective] disorder: Secondary | ICD-10-CM | POA: Diagnosis not present

## 2019-12-22 DIAGNOSIS — I129 Hypertensive chronic kidney disease with stage 1 through stage 4 chronic kidney disease, or unspecified chronic kidney disease: Secondary | ICD-10-CM | POA: Diagnosis not present

## 2019-12-22 DIAGNOSIS — E1122 Type 2 diabetes mellitus with diabetic chronic kidney disease: Secondary | ICD-10-CM | POA: Diagnosis not present

## 2019-12-22 DIAGNOSIS — E669 Obesity, unspecified: Secondary | ICD-10-CM | POA: Diagnosis not present

## 2019-12-22 DIAGNOSIS — G4736 Sleep related hypoventilation in conditions classified elsewhere: Secondary | ICD-10-CM | POA: Diagnosis not present

## 2019-12-22 DIAGNOSIS — N1831 Chronic kidney disease, stage 3a: Secondary | ICD-10-CM | POA: Diagnosis not present

## 2020-01-01 DIAGNOSIS — E038 Other specified hypothyroidism: Secondary | ICD-10-CM | POA: Diagnosis not present

## 2020-01-01 DIAGNOSIS — N1831 Chronic kidney disease, stage 3a: Secondary | ICD-10-CM | POA: Diagnosis not present

## 2020-01-01 DIAGNOSIS — D509 Iron deficiency anemia, unspecified: Secondary | ICD-10-CM | POA: Diagnosis not present

## 2020-01-01 DIAGNOSIS — E1122 Type 2 diabetes mellitus with diabetic chronic kidney disease: Secondary | ICD-10-CM | POA: Diagnosis not present

## 2020-01-19 DIAGNOSIS — M1611 Unilateral primary osteoarthritis, right hip: Secondary | ICD-10-CM | POA: Diagnosis not present

## 2020-01-19 DIAGNOSIS — M5136 Other intervertebral disc degeneration, lumbar region: Secondary | ICD-10-CM | POA: Diagnosis not present

## 2020-02-05 ENCOUNTER — Ambulatory Visit: Payer: Self-pay

## 2020-02-05 ENCOUNTER — Ambulatory Visit (INDEPENDENT_AMBULATORY_CARE_PROVIDER_SITE_OTHER): Payer: Medicare Other | Admitting: Physical Medicine and Rehabilitation

## 2020-02-05 ENCOUNTER — Encounter: Payer: Self-pay | Admitting: Physical Medicine and Rehabilitation

## 2020-02-05 ENCOUNTER — Other Ambulatory Visit: Payer: Self-pay

## 2020-02-05 DIAGNOSIS — M25551 Pain in right hip: Secondary | ICD-10-CM | POA: Diagnosis not present

## 2020-02-05 DIAGNOSIS — M1611 Unilateral primary osteoarthritis, right hip: Secondary | ICD-10-CM

## 2020-02-05 DIAGNOSIS — M5416 Radiculopathy, lumbar region: Secondary | ICD-10-CM

## 2020-02-05 NOTE — Progress Notes (Signed)
Ricky Sandoval - 86 y.o. male MRN [id number]  Date of birth: 1922-03-31  Office Visit Note: Visit Date: 02/05/2020 PCP: [name], MD Referred by: [name], MD  Subjective: Chief Complaint  Patient presents with  . Right Hip - Pain   HPI: Ricky Sandoval is a 86 y.o. male who comes in today For planned right intra-articular hip anesthetic arthrogram.  This injection was requested by his physician Dr. Suella Broad at [facility].  Patient has had prior injections of his lumbar spine and of his hip.  He has had these done at various practitioners but as of late this is been through Dr. Nelva Bush.  Evidently the patient has difficulty with transportation and Dr. Nelva Bush was not able to do this in the office for him.  I did tell the patient that I typically do not do both injections at the same time and is mainly from a medication standpoint and efficiency.  He mainly has pain today with rotation of his right hip and he asked pain with moving it and standing.  He is in a wheelchair.  Rates his pain is 8 out of 10.  We will complete hip injection today for significant osteoarthritis which is end-stage arthritis.  Depending on results I will have him scheduled for lumbar injection.  He will continue to follow-up with Dr. Nelva Bush  ROS Otherwise per HPI.  Assessment & Plan: Visit Diagnoses:  1. Pain in right hip   2. Unilateral primary osteoarthritis, right hip   3. Lumbar radiculopathy     Plan: No additional findings.   Meds & Orders: No orders of the defined types were placed in this encounter.   Orders Placed This Encounter  Procedures  . Large Joint Inj: R hip joint  . XR C-ARM NO REPORT    Follow-up: Return in about 2 weeks (around 02/19/2020) for right L5-S1 interlaminar epidural..   Procedures: Large Joint Inj: R hip joint on 02/05/2020 2:01 PM Indications: pain and diagnostic evaluation Details: 22 G needle, anterior approach  Arthrogram: Yes  Medications: 4 mL  bupivacaine 0.25 %; 60 mg triamcinolone acetonide 40 MG/ML Outcome: tolerated well, no immediate complications  Arthrogram demonstrated excellent flow of contrast throughout the joint surface without extravasation or obvious defect.  The patient had relief of symptoms during the anesthetic phase of the injection.  Procedure, treatment alternatives, risks and benefits explained, specific risks discussed. Consent was given by the patient. Immediately prior to procedure a time out was called to verify the correct patient, procedure, equipment, support staff and site/side marked as required. Patient was prepped and draped in the usual sterile fashion.      No notes on file   Clinical History: No specialty comments available.   He has no history on file for tobacco use. No results for input(s): HGBA1C, LABURIC in the last 8760 hours.  Objective:  VS:  HT:    WT:   BMI:     BP:   HR: bpm  TEMP: ( )  RESP:  Physical Exam Constitutional:      General: He is not in acute distress.    Appearance: Normal appearance. He is not ill-appearing.  HENT:     Head: Normocephalic and atraumatic.     Right Ear: External ear normal.     Left Ear: External ear normal.  Eyes:     Extraocular Movements: Extraocular movements intact.  Cardiovascular:     Rate and Rhythm: Normal rate.     Pulses:  Normal pulses.  Abdominal:     General: There is no distension.     Palpations: Abdomen is soft.  Musculoskeletal:        General: No tenderness or signs of injury.     Right lower leg: No edema.     Left lower leg: No edema.     Comments: Patient has good distal strength without clonus.  He has concordant low back and right hip pain with right hip internal rotation.  He is sitting in a wheelchair.  Very slow to rise from seated position with a lot of difficulty.  Skin:    Findings: No erythema or rash.  Neurological:     General: No focal deficit present.     Mental Status: He is alert and oriented to  person, place, and time.     Sensory: No sensory deficit.     Motor: No weakness or abnormal muscle tone.     Coordination: Coordination normal.  Psychiatric:        Mood and Affect: Mood normal.        Behavior: Behavior normal.     Ortho Exam  Imaging: No results found.  Past Medical/Family/Surgical/Social History: Medications & Allergies reviewed per EMR, new medications updated. There are no problems to display for this patient.  Past Medical History:  Diagnosis Date  . Diabetes type 2, controlled (HCC)   . High cholesterol   . Hypertension   . Hypothyroid    History reviewed. No pertinent family history. History reviewed. No pertinent surgical history. Social History   Occupational History  . Not on file  Tobacco Use  . Smoking status: Not on file  Substance and Sexual Activity  . Alcohol use: Not on file  . Drug use: Not on file  . Sexual activity: Not on file

## 2020-02-05 NOTE — Progress Notes (Signed)
Pt states pain in the right hip (groin pain). Pt states pain started years ago. Lifting right leg and moving it the wrong way makes pain worse. Sitting and standing helps with pain.   .Numeric Pain Rating Scale and Functional Assessment Average Pain 8   In the last MONTH (on 0-10 scale) has pain interfered with the following?  1. General activity like being  able to carry out your everyday physical activities such as walking, climbing stairs, carrying groceries, or moving a chair?  Rating(8)   +Driver, -BT, -Dye Allergies.

## 2020-02-09 MED ORDER — BUPIVACAINE HCL 0.25 % IJ SOLN
4.0000 mL | INTRAMUSCULAR | Status: AC | PRN
  Administered 2020-02-05: 4 mL via INTRA_ARTICULAR

## 2020-02-09 MED ORDER — TRIAMCINOLONE ACETONIDE 40 MG/ML IJ SUSP
60.0000 mg | INTRAMUSCULAR | Status: AC | PRN
  Administered 2020-02-05: 60 mg via INTRA_ARTICULAR

## 2020-03-10 ENCOUNTER — Other Ambulatory Visit: Payer: Self-pay

## 2020-03-10 ENCOUNTER — Ambulatory Visit (INDEPENDENT_AMBULATORY_CARE_PROVIDER_SITE_OTHER): Payer: Medicare Other | Admitting: Physical Medicine and Rehabilitation

## 2020-03-10 ENCOUNTER — Ambulatory Visit: Payer: Self-pay

## 2020-03-10 ENCOUNTER — Encounter: Payer: Medicare Other | Admitting: Physical Medicine and Rehabilitation

## 2020-03-10 VITALS — BP 136/61 | HR 70

## 2020-03-10 DIAGNOSIS — M5416 Radiculopathy, lumbar region: Secondary | ICD-10-CM | POA: Diagnosis not present

## 2020-03-10 MED ORDER — METHYLPREDNISOLONE ACETATE 80 MG/ML IJ SUSP
80.0000 mg | Freq: Once | INTRAMUSCULAR | Status: AC
  Administered 2020-03-10: 80 mg

## 2020-03-10 NOTE — Progress Notes (Signed)
PT states pain in his right lower back. Pt. States he had a inj that helped. Pt has hx right hip joint inj. Pt states that the pain travel down right leg.  Numeric Pain Rating Scale and Functional Assessment Average Pain 7   In the last MONTH (on 0-10 scale) has pain interfered with the following?  1. General activity like being  able to carry out your everyday physical activities such as walking, climbing stairs, carrying groceries, or moving a chair?  Rating(7)   +Driver, -BT, -Dye Allergies.

## 2020-04-05 NOTE — Progress Notes (Signed)
Ricky Sandoval - 86 y.o. male MRN [id number]  Date of birth: [birth year]  Office Visit Note: Visit Date: 03/10/2020 PCP: Chilton Greathouse, MD Referred by: Chilton Greathouse, MD  Subjective: Chief Complaint  Patient presents with  . Lower Back - Pain   HPI:  Ricky Sandoval is a 86 y.o. male who comes in today at the request of Dr. Sheran Luz at [facility] for planned Right L5-S1 Lumbar epidural steroid injection with fluoroscopic guidance.  The patient has failed conservative care including home exercise, medications, time and activity modification.  This injection will be diagnostic and hopefully therapeutic.  Please see requesting physician notes for further details and justification.   ROS Otherwise per HPI.  Assessment & Plan: Visit Diagnoses:  1. Lumbar radiculopathy     Plan: No additional findings.   Meds & Orders:  Meds ordered this encounter  Medications  . methylPREDNISolone acetate (DEPO-MEDROL) injection 80 mg    Orders Placed This Encounter  Procedures  . XR C-ARM NO REPORT  . Epidural Steroid injection    Follow-up: Return for visit to requesting physician as needed.   Procedures: No procedures performed  Lumbar Epidural Steroid Injection - Interlaminar Approach with Fluoroscopic Guidance  Patient: Ricky Sandoval      Date of Birth: 1922-01-22 MRN: [id number] PCP: Chilton Greathouse, MD      Visit Date: 03/10/2020   Universal Protocol:     Consent Given By: the patient  Position: PRONE  Additional Comments: Vital signs were monitored before and after the procedure. Patient was prepped and draped in the usual sterile fashion. The correct patient, procedure, and site was verified.   Injection Procedure Details:  Procedure Site One Meds Administered:  Meds ordered this encounter  Medications  . methylPREDNISolone acetate (DEPO-MEDROL) injection 80 mg     Laterality: Right  Location/Site:  L5-S1  Needle size: 20 G  Needle type:  Tuohy  Needle Placement: Paramedian epidural  Findings:   -Comments: Excellent flow of contrast into the epidural space.  Procedure Details: Using a paramedian approach from the side mentioned above, the region overlying the inferior lamina was localized under fluoroscopic visualization and the soft tissues overlying this structure were infiltrated with 4 ml. of 1% Lidocaine without Epinephrine. The Tuohy needle was inserted into the epidural space using a paramedian approach.   The epidural space was localized using loss of resistance along with lateral and bi-planar fluoroscopic views.  After negative aspirate for air, blood, and CSF, a 2 ml. volume of Isovue-250 was injected into the epidural space and the flow of contrast was observed. Radiographs were obtained for documentation purposes.    The injectate was administered into the level noted above.   Additional Comments:  The patient tolerated the procedure well Dressing: 2 x 2 sterile gauze and Band-Aid    Post-procedure details: Patient was observed during the procedure. Post-procedure instructions were reviewed.  Patient left the clinic in stable condition.    Clinical History: No specialty comments available.     Objective:  VS:  HT:    WT:   BMI:     BP:136/61  HR:70bpm  TEMP: ( )  RESP:  Physical Exam Constitutional:      General: He is not in acute distress.    Appearance: Normal appearance. He is not ill-appearing.  HENT:     Head: Normocephalic and atraumatic.     Right Ear: External ear normal.     Left Ear: External ear normal.  Eyes:  Extraocular Movements: Extraocular movements intact.  Cardiovascular:     Rate and Rhythm: Normal rate.     Pulses: Normal pulses.  Abdominal:     General: There is no distension.     Palpations: Abdomen is soft.  Musculoskeletal:        General: No tenderness or signs of injury.     Right lower leg: No edema.     Left lower leg: No edema.     Comments:  Patient has good distal strength without clonus. Painful right hip ROM. Sitting in wheelchair.  Skin:    Findings: No erythema or rash.  Neurological:     General: No focal deficit present.     Mental Status: He is alert and oriented to person, place, and time.     Sensory: No sensory deficit.     Motor: No weakness or abnormal muscle tone.     Coordination: Coordination normal.  Psychiatric:        Mood and Affect: Mood normal.        Behavior: Behavior normal.      Imaging: No results found.

## 2020-04-05 NOTE — Procedures (Signed)
Lumbar Epidural Steroid Injection - Interlaminar Approach with Fluoroscopic Guidance  Patient: Ricky Sandoval      Date of Birth: 12-24-1921 MRN: [id number] PCP: Chilton Greathouse, MD      Visit Date: 03/10/2020   Universal Protocol:     Consent Given By: the patient  Position: PRONE  Additional Comments: Vital signs were monitored before and after the procedure. Patient was prepped and draped in the usual sterile fashion. The correct patient, procedure, and site was verified.   Injection Procedure Details:  Procedure Site One Meds Administered:  Meds ordered this encounter  Medications  . methylPREDNISolone acetate (DEPO-MEDROL) injection 80 mg     Laterality: Right  Location/Site:  L5-S1  Needle size: 20 G  Needle type: Tuohy  Needle Placement: Paramedian epidural  Findings:   -Comments: Excellent flow of contrast into the epidural space.  Procedure Details: Using a paramedian approach from the side mentioned above, the region overlying the inferior lamina was localized under fluoroscopic visualization and the soft tissues overlying this structure were infiltrated with 4 ml. of 1% Lidocaine without Epinephrine. The Tuohy needle was inserted into the epidural space using a paramedian approach.   The epidural space was localized using loss of resistance along with lateral and bi-planar fluoroscopic views.  After negative aspirate for air, blood, and CSF, a 2 ml. volume of Isovue-250 was injected into the epidural space and the flow of contrast was observed. Radiographs were obtained for documentation purposes.    The injectate was administered into the level noted above.   Additional Comments:  The patient tolerated the procedure well Dressing: 2 x 2 sterile gauze and Band-Aid    Post-procedure details: Patient was observed during the procedure. Post-procedure instructions were reviewed.  Patient left the clinic in stable condition.

## 2020-04-23 DIAGNOSIS — N1831 Chronic kidney disease, stage 3a: Secondary | ICD-10-CM | POA: Diagnosis not present

## 2020-04-23 DIAGNOSIS — I129 Hypertensive chronic kidney disease with stage 1 through stage 4 chronic kidney disease, or unspecified chronic kidney disease: Secondary | ICD-10-CM | POA: Diagnosis not present

## 2020-04-23 DIAGNOSIS — D509 Iron deficiency anemia, unspecified: Secondary | ICD-10-CM | POA: Diagnosis not present

## 2020-04-23 DIAGNOSIS — E1122 Type 2 diabetes mellitus with diabetic chronic kidney disease: Secondary | ICD-10-CM | POA: Diagnosis not present

## 2020-05-19 DIAGNOSIS — G4736 Sleep related hypoventilation in conditions classified elsewhere: Secondary | ICD-10-CM | POA: Diagnosis not present

## 2020-05-19 DIAGNOSIS — N400 Enlarged prostate without lower urinary tract symptoms: Secondary | ICD-10-CM | POA: Diagnosis not present

## 2020-05-19 DIAGNOSIS — N1831 Chronic kidney disease, stage 3a: Secondary | ICD-10-CM | POA: Diagnosis not present

## 2020-05-19 DIAGNOSIS — J302 Other seasonal allergic rhinitis: Secondary | ICD-10-CM | POA: Diagnosis not present

## 2020-05-19 DIAGNOSIS — D509 Iron deficiency anemia, unspecified: Secondary | ICD-10-CM | POA: Diagnosis not present

## 2020-05-19 DIAGNOSIS — E669 Obesity, unspecified: Secondary | ICD-10-CM | POA: Diagnosis not present

## 2020-05-19 DIAGNOSIS — E785 Hyperlipidemia, unspecified: Secondary | ICD-10-CM | POA: Diagnosis not present

## 2020-05-19 DIAGNOSIS — Z23 Encounter for immunization: Secondary | ICD-10-CM | POA: Diagnosis not present

## 2020-05-19 DIAGNOSIS — E1122 Type 2 diabetes mellitus with diabetic chronic kidney disease: Secondary | ICD-10-CM | POA: Diagnosis not present

## 2020-05-19 DIAGNOSIS — E039 Hypothyroidism, unspecified: Secondary | ICD-10-CM | POA: Diagnosis not present

## 2020-05-19 DIAGNOSIS — I129 Hypertensive chronic kidney disease with stage 1 through stage 4 chronic kidney disease, or unspecified chronic kidney disease: Secondary | ICD-10-CM | POA: Diagnosis not present

## 2020-06-01 ENCOUNTER — Telehealth: Payer: Self-pay | Admitting: Physical Medicine and Rehabilitation

## 2020-06-01 NOTE — Telephone Encounter (Signed)
Patient's CNA Misty called needing to make an appointment for patient. The number to contact Lanice Schwab is [phone] Call  between the hours of 1:00pm-4:30pm

## 2020-06-02 ENCOUNTER — Telehealth: Payer: Self-pay

## 2020-06-02 NOTE — Telephone Encounter (Signed)
Patient is requesting injections in his right hip (last was 02/05/20) and his right shoulder. Please advise.

## 2020-06-02 NOTE — Telephone Encounter (Signed)
See previous message

## 2020-06-02 NOTE — Telephone Encounter (Signed)
Called patient. He answered and then he ended the call.

## 2020-06-02 NOTE — Telephone Encounter (Signed)
Ok but he will need Ortho to follow shoulder if not helpful

## 2020-06-02 NOTE — Telephone Encounter (Signed)
Patient called back retuning missed call

## 2020-06-03 NOTE — Telephone Encounter (Signed)
Scheduled for 10/21 at 1345.

## 2020-06-17 ENCOUNTER — Ambulatory Visit: Payer: Self-pay

## 2020-06-17 ENCOUNTER — Other Ambulatory Visit: Payer: Self-pay

## 2020-06-17 ENCOUNTER — Encounter: Payer: Self-pay | Admitting: Physical Medicine and Rehabilitation

## 2020-06-17 ENCOUNTER — Ambulatory Visit (INDEPENDENT_AMBULATORY_CARE_PROVIDER_SITE_OTHER): Payer: Medicare Other | Admitting: Physical Medicine and Rehabilitation

## 2020-06-17 DIAGNOSIS — M25511 Pain in right shoulder: Secondary | ICD-10-CM

## 2020-06-17 DIAGNOSIS — M25551 Pain in right hip: Secondary | ICD-10-CM | POA: Diagnosis not present

## 2020-06-17 DIAGNOSIS — G8929 Other chronic pain: Secondary | ICD-10-CM | POA: Diagnosis not present

## 2020-06-17 NOTE — Progress Notes (Signed)
Pt state right hip pain. Pt state it takes a long time to get out of bed. Pt state Pain meds to helps ease the pain.  Numeric Pain Rating Scale and Functional Assessment Average Pain 0   In the last MONTH (on 0-10 scale) has pain interfered with the following?  1. General activity like being  able to carry out your everyday physical activities such as walking, climbing stairs, carrying groceries, or moving a chair?  Rating(8)   +Driver, -BT, -Dye Allergies.

## 2020-07-25 MED ORDER — TRIAMCINOLONE ACETONIDE 40 MG/ML IJ SUSP
40.0000 mg | INTRAMUSCULAR | Status: AC | PRN
  Administered 2020-06-17: 40 mg via INTRA_ARTICULAR

## 2020-07-25 MED ORDER — TRIAMCINOLONE ACETONIDE 40 MG/ML IJ SUSP
60.0000 mg | INTRAMUSCULAR | Status: AC | PRN
  Administered 2020-06-17: 60 mg via INTRA_ARTICULAR

## 2020-07-25 MED ORDER — BUPIVACAINE HCL 0.25 % IJ SOLN
4.0000 mL | INTRAMUSCULAR | Status: AC | PRN
  Administered 2020-06-17: 4 mL via INTRA_ARTICULAR

## 2020-07-25 NOTE — Progress Notes (Signed)
Ricky Sandoval - 86 y.o. male MRN [id number]  Date of birth: 1922-07-31  Office Visit Note: Visit Date: 06/17/2020 PCP: Chilton Greathouse, MD Referred by: Chilton Greathouse, MD  Subjective: Chief Complaint  Patient presents with  . Right Hip - Pain   HPI:  Ricky Sandoval is a 86 y.o. male who comes in today For planned repeat right hip intra-articular injection and diagnostic and hopefully therapeutic right glenohumeral joint injection with fluoroscopic guidance.  Patient is really a patient of Dr. Sheran Luz at [facility]  Dr. Ethelene Hal was performing intermittent epidural injection and hip injections with good relief for the patient.  Patient is somewhat limited in his movement at his age and he does have end-stage arthritis of the right hip.  He reports a Dr. Ethelene Hal has done shoulder injections as well intermittently but has not had one recently.  He has had x-ray imaging of arthritis of the glenohumeral joints bilaterally.  He actually is done much better with the hip injections and not really gotten much relief with the epidural injection.  Most of his complaints in the lower back and hip are from the hip itself.  We started seeing the patient do to the fact that we could get a man on the timing in the office where Dr. Ethelene Hal could not see him on certain days.  ROS Otherwise per HPI.  Assessment & Plan: Visit Diagnoses:  1. Chronic right shoulder pain   2. Pain in right hip     Plan: No additional findings.   Meds & Orders: No orders of the defined types were placed in this encounter.   Orders Placed This Encounter  Procedures  . Large Joint Inj  . Large Joint Inj  . XR C-ARM NO REPORT    Follow-up: Return if symptoms worsen or fail to improve.   Procedures: Large Joint Inj: R hip joint on 06/17/2020 1:48 PM Indications: diagnostic evaluation and pain Details: 22 G 3.5 in needle, fluoroscopy-guided anterior approach  Arthrogram: No  Medications: 4 mL bupivacaine 0.25 %;  60 mg triamcinolone acetonide 40 MG/ML Outcome: tolerated well, no immediate complications  There was excellent flow of contrast producing a partial arthrogram of the hip. The patient did have relief of symptoms during the anesthetic phase of the injection. Procedure, treatment alternatives, risks and benefits explained, specific risks discussed. Consent was given by the patient. Immediately prior to procedure a time out was called to verify the correct patient, procedure, equipment, support staff and site/side marked as required. Patient was prepped and draped in the usual sterile fashion.   Large Joint Inj: R glenohumeral on 06/17/2020 1:49 PM Indications: pain and diagnostic evaluation Details: 22 G 3.5 in needle, anteromedial approach  Arthrogram: Yes  Medications: 4 mL bupivacaine 0.25 %; 40 mg triamcinolone acetonide 40 MG/ML Outcome: tolerated well, no immediate complications  Arthrogram demonstrated excellent flow of contrast throughout the joint surface without extravasation or obvious defect.  The patient had relief of symptoms during the anesthetic phase of the injection.  Procedure, treatment alternatives, risks and benefits explained, specific risks discussed. Consent was given by the patient. Immediately prior to procedure a time out was called to verify the correct patient, procedure, equipment, support staff and site/side marked as required. Patient was prepped and draped in the usual sterile fashion.      No notes on file   Clinical History: No specialty comments available.     Objective:  VS:  HT:    WT:  BMI:     BP:   HR: bpm  TEMP: ( )  RESP:  Physical Exam   Imaging: No results found.

## 2020-08-09 DIAGNOSIS — R2689 Other abnormalities of gait and mobility: Secondary | ICD-10-CM | POA: Diagnosis not present

## 2020-08-09 DIAGNOSIS — E1122 Type 2 diabetes mellitus with diabetic chronic kidney disease: Secondary | ICD-10-CM | POA: Diagnosis not present

## 2020-08-09 DIAGNOSIS — N1831 Chronic kidney disease, stage 3a: Secondary | ICD-10-CM | POA: Diagnosis not present

## 2020-08-09 DIAGNOSIS — D509 Iron deficiency anemia, unspecified: Secondary | ICD-10-CM | POA: Diagnosis not present

## 2020-08-09 DIAGNOSIS — J302 Other seasonal allergic rhinitis: Secondary | ICD-10-CM | POA: Diagnosis not present

## 2020-08-09 DIAGNOSIS — I129 Hypertensive chronic kidney disease with stage 1 through stage 4 chronic kidney disease, or unspecified chronic kidney disease: Secondary | ICD-10-CM | POA: Diagnosis not present

## 2020-08-09 DIAGNOSIS — E039 Hypothyroidism, unspecified: Secondary | ICD-10-CM | POA: Diagnosis not present

## 2020-08-18 DIAGNOSIS — H353131 Nonexudative age-related macular degeneration, bilateral, early dry stage: Secondary | ICD-10-CM | POA: Diagnosis not present

## 2020-08-18 DIAGNOSIS — E119 Type 2 diabetes mellitus without complications: Secondary | ICD-10-CM | POA: Diagnosis not present

## 2020-08-18 DIAGNOSIS — Z961 Presence of intraocular lens: Secondary | ICD-10-CM | POA: Diagnosis not present

## 2020-08-18 DIAGNOSIS — H40053 Ocular hypertension, bilateral: Secondary | ICD-10-CM | POA: Diagnosis not present

## 2020-09-08 DIAGNOSIS — Z79899 Other long term (current) drug therapy: Secondary | ICD-10-CM | POA: Diagnosis not present

## 2020-09-08 DIAGNOSIS — E782 Mixed hyperlipidemia: Secondary | ICD-10-CM | POA: Diagnosis not present

## 2020-09-08 DIAGNOSIS — M159 Polyosteoarthritis, unspecified: Secondary | ICD-10-CM | POA: Diagnosis not present

## 2020-09-08 DIAGNOSIS — E039 Hypothyroidism, unspecified: Secondary | ICD-10-CM | POA: Diagnosis not present

## 2020-09-08 DIAGNOSIS — I10 Essential (primary) hypertension: Secondary | ICD-10-CM | POA: Diagnosis not present

## 2020-09-08 DIAGNOSIS — F5101 Primary insomnia: Secondary | ICD-10-CM | POA: Diagnosis not present

## 2020-09-08 DIAGNOSIS — K5901 Slow transit constipation: Secondary | ICD-10-CM | POA: Diagnosis not present

## 2020-09-25 ENCOUNTER — Other Ambulatory Visit: Payer: Self-pay

## 2020-09-25 ENCOUNTER — Emergency Department (HOSPITAL_COMMUNITY): Payer: Medicare Other

## 2020-09-25 ENCOUNTER — Emergency Department (HOSPITAL_COMMUNITY)
Admission: EM | Admit: 2020-09-25 | Discharge: 2020-09-25 | Disposition: A | Discharge status: Home / Self Care | Payer: Medicare Other | Attending: Emergency Medicine | Admitting: Emergency Medicine

## 2020-09-25 DIAGNOSIS — Z7982 Long term (current) use of aspirin: Secondary | ICD-10-CM | POA: Insufficient documentation

## 2020-09-25 DIAGNOSIS — M549 Dorsalgia, unspecified: Secondary | ICD-10-CM | POA: Diagnosis not present

## 2020-09-25 DIAGNOSIS — E119 Type 2 diabetes mellitus without complications: Secondary | ICD-10-CM | POA: Insufficient documentation

## 2020-09-25 DIAGNOSIS — M545 Low back pain, unspecified: Secondary | ICD-10-CM | POA: Insufficient documentation

## 2020-09-25 DIAGNOSIS — E039 Hypothyroidism, unspecified: Secondary | ICD-10-CM | POA: Insufficient documentation

## 2020-09-25 DIAGNOSIS — M5459 Other low back pain: Secondary | ICD-10-CM | POA: Diagnosis not present

## 2020-09-25 DIAGNOSIS — Z79899 Other long term (current) drug therapy: Secondary | ICD-10-CM | POA: Insufficient documentation

## 2020-09-25 DIAGNOSIS — I10 Essential (primary) hypertension: Secondary | ICD-10-CM | POA: Diagnosis not present

## 2020-09-25 MED ORDER — OXYCODONE-ACETAMINOPHEN 5-325 MG PO TABS: 1.0000 | ORAL_TABLET | Freq: Once | ORAL | Status: DC

## 2020-09-25 MED ORDER — ACETAMINOPHEN 325 MG PO TABS
325.0000 mg | ORAL_TABLET | Freq: Once | ORAL | Status: AC
  Administered 2020-09-25: 325 mg via ORAL
  Filled 2020-09-25: qty 1

## 2020-09-25 MED ORDER — HYDROCODONE-ACETAMINOPHEN 5-325 MG PO TABS
1.0000 | ORAL_TABLET | Freq: Once | ORAL | Status: AC
  Administered 2020-09-25: 1 via ORAL
  Filled 2020-09-25: qty 1

## 2020-09-25 MED ORDER — ACETAMINOPHEN 500 MG PO TABS: 500.0000 mg | ORAL_TABLET | Freq: Once | ORAL | Status: DC

## 2020-09-25 MED ORDER — HYDROCODONE-ACETAMINOPHEN 5-325 MG PO TABS: 1.0000 | ORAL_TABLET | Freq: Four times a day (QID) | ORAL | 0 refills | Status: DC | PRN

## 2020-09-25 NOTE — ED Notes (Signed)
Temp checked x2

## 2020-09-25 NOTE — ED Notes (Signed)
Pt ambulated using walker with minimal assistance. Pt sit to stand with 1-person assist. Pt reported 9/10 pain while ambulating

## 2020-09-25 NOTE — ED Provider Notes (Signed)
[facility]-EMERGENCY DEPT Provider Note   CSN: [id number] Arrival date & time: 09/25/20  0746     History Chief Complaint  Patient presents with  . Back Pain    Ricky Sandoval is a 87 y.o. male.  HPI Patient is 87 year old male with a past medical history of DM 2, HTN, HLD, hypothyroidism, chronic back pain.  Patient states he has chronic back pain of the lumbar area.  He states that he has had steroid injections in his low back in the past.  He states he is followed by Ronne Binning of physical medicine for this.  He is currently living in assisted living.  He states that this morning while dressing he attempted to get out of bed and got onto the back of his walker and pulled himself up.  He states that this took several efforts.  He states that after this he did manage to stand up and walked over to a bench where he sat down and states that then when he straightened up while sitting down he had sudden onset of severe back pain.  He states it has been constant since that time primarily significantly worse with movement specifically extension of the spine.  He states that the area is in the same region where he has chronic pain.  He says he has not had any sensation loss in his lower extremities, no weakness no bowel or bladder incontinence, no fevers, no history of IV drug use, he denies any radiation of the pain down either leg.  No saddle anesthesia.     Past Medical History:  Diagnosis Date  . Diabetes type 2, controlled (HCC)   . High cholesterol   . Hypertension   . Hypothyroid     There are no problems to display for this patient.   No past surgical history on file.     No family history on file.     Home Medications Prior to Admission medications   Medication Sig Start Date End Date Taking? Authorizing Provider  HYDROcodone-acetaminophen (NORCO/VICODIN) 5-325 MG tablet Take 1 tablet by mouth every 6 (six) hours as needed for severe  pain. 09/25/20  Yes Marnita Poirier, Stevphen Meuse S, PA  acetaminophen (ARTHRITIS PAIN RELIEF) 650 MG CR tablet Take 1,300 mg by mouth 2 (two) times daily.    [provider]  aspirin 81 MG tablet Take 81 mg by mouth daily.    [provider]  caffeine (NO DOZ MAXIMUM STRENGTH) 200 MG TABS tablet Take 400 mg by mouth every morning.    [provider]  Coenzyme Q10-Fish Oil-Vit E (CO-Q 10 OMEGA-3 FISH OIL PO) Take 2 tablets by mouth daily.    [provider]  docusate sodium (COLACE) 100 MG capsule Take 300 mg by mouth daily.    [provider]  ferrous sulfate 325 (65 FE) MG tablet Take 325 mg by mouth daily with breakfast.    [provider]  flunisolide (NASALIDE) 25 MCG/ACT (0.025%) SOLN Place 2 sprays into the nose 2 (two) times daily as needed (allergies).    [provider]  gabapentin (NEURONTIN) 100 MG capsule Take 100 mg by mouth at bedtime.    [provider]  Ginkgo Biloba 60 MG CAPS Take 3 capsules by mouth 2 (two) times daily.    [provider]  Homeopathic Products (LEG CRAMP RELIEF PO) Take 1 tablet by mouth daily as needed (leg cramps). Leg cramps/quinine    [provider]  KRILL OIL  PO Take 500 mg by mouth daily.    [provider]  levothyroxine (SYNTHROID, LEVOTHROID) 75 MCG tablet Take 75 mcg by mouth daily before breakfast.    [provider]  loratadine (CLARITIN) 10 MG tablet Take 10 mg by mouth daily.    [provider]  losartan (COZAAR) 100 MG tablet Take 100 mg by mouth 2 (two) times daily.    [provider]  Lutein 20 MG CAPS Take 1 capsule by mouth 2 (two) times daily.    [provider]  Misc Natural Products (OSTEO BI-FLEX JOINT SHIELD PO) Take 1 tablet by mouth 2 (two) times daily.    [provider]  Multiple Vitamins-Minerals (CENTRUM SILVER PO) Take 1 tablet by mouth daily.    [provider]  omeprazole (PRILOSEC) 20 MG  capsule Take 20 mg by mouth daily as needed (indigestion/heartburn).    [provider]  pyridOXINE (VITAMIN B-6) 100 MG tablet Take 100 mg by mouth daily.    [provider]  simvastatin (ZOCOR) 40 MG tablet Take 20 mg by mouth at bedtime.    [provider]  tamsulosin (FLOMAX) 0.4 MG CAPS capsule Take 0.4 mg by mouth at bedtime.    [provider]  vitamin B-12 (CYANOCOBALAMIN) 1000 MCG tablet Take 1,000 mcg by mouth daily.    [provider]  vitamin C (ASCORBIC ACID) 500 MG tablet Take 500 mg by mouth daily.    [provider]    Allergies    Patient has no known allergies.  Review of Systems   Review of Systems  Constitutional: Negative for chills and fever.  HENT: Negative for congestion.   Respiratory: Negative for shortness of breath.   Cardiovascular: Negative for chest pain.  Gastrointestinal: Negative for abdominal pain.  Musculoskeletal: Positive for back pain. Negative for neck pain.    Physical Exam Updated Vital Signs BP (!) 149/117 (BP Location: Right Arm)   Pulse 86   Temp (!) 97.5 F (36.4 C) (Oral)   Resp 16   Ht 5\' 5"  (1.651 m)   Wt 82.6 kg   SpO2 99%   BMI 30.30 kg/m   Physical Exam Vitals and nursing note reviewed.  Constitutional:      Appearance: Normal appearance. He is not ill-appearing or toxic-appearing.     Comments: Pleasant well-appearing 87 year old. Uncomfortable but in no acute distress. Able answer questions appropriately follow commands. No increased work of breathing. Speaking in full sentences.  HENT:     Head: Normocephalic and atraumatic.     Nose: Nose normal.     Mouth/Throat:     Mouth: Mucous membranes are moist.  Eyes:     General: No scleral icterus.       Right eye: No discharge.        Left eye: No discharge.     Conjunctiva/sclera: Conjunctivae normal.  Cardiovascular:     Rate and Rhythm: Normal rate and regular rhythm.     Pulses: Normal pulses.     Heart  sounds: Normal heart sounds.     Comments: Bilateral DP PT and radial artery pulses are 3+ and symmetric Pulmonary:     Effort: Pulmonary effort is normal. No respiratory distress.     Breath sounds: No stridor. No wheezing.  Abdominal:     Palpations: Abdomen is soft.     Tenderness: There is no abdominal tenderness. There is no guarding or rebound.  Musculoskeletal:     Cervical back: Normal range  of motion.     Right lower leg: No edema.     Left lower leg: No edema.     Comments: Midline tenderness to palpation of the upper L-spine. No significant muscular tenderness.  Skin:    General: Skin is warm and dry.     Capillary Refill: Capillary refill takes less than 2 seconds.  Neurological:     Mental Status: He is alert and oriented to person, place, and time. Mental status is at baseline.     Comments: Sensation intact in bilateral lower extremities Moves all 4 extremities without difficulty and with good coordination  Psychiatric:        Mood and Affect: Mood normal.        Behavior: Behavior normal.     ED Results / Procedures / Treatments   Labs (all labs ordered are listed, but only abnormal results are displayed) Labs Reviewed - No data to display  EKG None  Radiology DG Lumbar Spine Complete  Result Date: 09/25/2020 CLINICAL DATA:  Low back pain.  Sudden severe pain with standing. EXAM: LUMBAR SPINE - COMPLETE 4+ VIEW COMPARISON:  Plain film of the pelvis dated 06/10/2019. FINDINGS: Marked levoscoliosis of the lumbar spine. Advanced degenerative spondylosis of the mid and lower lumbar spine with associated disc space narrowings and osteophyte formation, L3 through L5 levels appearing stable compared to the earlier plain film of the pelvis dated 06/10/2019. Additional degenerative hypertrophy of the posterior elements in the lower lumbar spine, with probable associated neural foramen encroachment at the L4-5 and L5-S1 levels. Mild vertebral body height loss at the L2 and  T12 vertebral body levels, of uncertain age but most likely chronic mild compressions. No fracture line or displaced fracture fragment is seen. No acute-appearing cortical irregularity or osseous lesion. Extensive aortic atherosclerosis. Visualized paravertebral soft tissues are otherwise unremarkable. Severe degenerative change at the RIGHT hip joint, incompletely image IMPRESSION: 1. Mild vertebral body height loss at the L2 and T12 vertebral body levels, of uncertain age but most likely chronic mild vertebral body compressions. 2. Advanced degenerative spondylosis of the mid and lower lumbar spine, as detailed above. Suspect associated nerve root impingement at 1 or more levels. 3. Severe degenerative change at the RIGHT hip joint, incompletely imaged. 4. No acute-appearing osseous abnormality. 5. Aortic atherosclerosis. Electronically Signed   By: Bary Richard M.D.   On: 09/25/2020 09:10   CT Lumbar Spine Wo Contrast  Result Date: 09/25/2020 CLINICAL DATA:  Low back pain, evaluate for lumbar compression fracture. EXAM: CT LUMBAR SPINE WITHOUT CONTRAST TECHNIQUE: Multidetector CT imaging of the lumbar spine was performed without intravenous contrast administration. Multiplanar CT image reconstructions were also generated. COMPARISON:  06/25/2006 FINDINGS: Segmentation: 5 lumbar type vertebrae Alignment: Levoscoliosis and loss of lumbar lordosis. Vertebrae: Generalized osteopenia. No fracture, visible discitis, or aggressive bone lesion. Paraspinal and other soft tissues: Diffuse atheromatous calcification with ectatic infrarenal aorta. Atrophy of intrinsic back muscles. Disc levels: T12- L1: Unremarkable for age. L1-L2: Disc narrowing and bulging with endplate spurring. Degenerative facet spurring and ligamentous thickening. Mild bilateral foraminal narrowing. L2-L3: Disc collapse with diffuse gas containing fissure. Degenerative facet spurring asymmetric to the right. Facet osteoarthritis with bilateral  spurring. Moderate right foraminal impingement. Moderate or advanced spinal stenosis. L3-L4: Disc collapse with gas containing fissure. Degenerative endplate spurring. Facet osteoarthritis on both sides. Left more than right foraminal impingement. Advanced spinal stenosis. L4-L5: Disc collapse with endplate ridging. Facet osteoarthritis with left more than right degenerative spurring. Advanced spinal and left  foraminal stenosis. L5-S1:Disc collapse with gas containing fissure. Degenerative endplate spurring and disc bulging. Advanced facet osteoarthritis. Severe left and moderate to advanced right foraminal stenosis. Mild spinal stenosis. IMPRESSION: 1. No acute finding. 2. Advanced degenerative disease with scoliosis and high-grade spinal/foraminal stenoses described above. Electronically Signed   By: Marnee Spring M.D.   On: 09/25/2020 11:05    Procedures Procedures   Medications Ordered in ED Medications  HYDROcodone-acetaminophen (NORCO/VICODIN) 5-325 MG per tablet 1 tablet (1 tablet Oral Given 09/25/20 0818)  acetaminophen (TYLENOL) tablet 325 mg (325 mg Oral Given 09/25/20 0818)    ED Course  I have reviewed the triage vital signs and the nursing notes.  Pertinent labs & imaging results that were available during my care of the patient were reviewed by me and considered in my medical decision making (see chart for details).    MDM Rules/Calculators/A&P                          Patient is 87 year old male presented today with low back pain seems like it acutely worsened this morning--low mechanism of injury. I have low suspicion for fracture although given his age and he has a small compression fracture.  Will obtain x-ray.  He does have midline upper lumbar tenderness to palpation.  Patient does have mild vertebral body loss of the vertebra L2 and T12 is noted to be of uncertain age. I do have some concern for vertebral compression fracture we will therefore CT image.  IMPRESSION: 1.  No acute finding. 2. Advanced degenerative disease with scoliosis and high-grade spinal/foraminal stenoses described above. Electronically Signed   By: Marnee Spring M.D.   On: 09/25/2020 11:05   CT imaging results above.  I personally reviewed the images and agree with radiology read.  Patient has neurosurgeon and physical medicine MD.  He will follow-up with both of these as well as his PCP.  His blood pressure was elevated today and he will need to follow-up with his PCP as well as as well.  I discussed this case my attending physician assessed patient at bedside  during the visit.  Given that CT imaging shows no acute fracture suspect this is likely related to his chronic back pain seems to be acute on chronic back pain.  Provided patient with a short course of Norco.  He will follow-up with his medical team.  Given return precautions.   Final Clinical Impression(s) / ED Diagnoses Final diagnoses:  Midline low back pain without sciatica, unspecified chronicity    Rx / DC Orders ED Discharge Orders         Ordered    HYDROcodone-acetaminophen (NORCO/VICODIN) 5-325 MG tablet  Every 6 hours PRN        09/25/20 1127           Solon Augusta Villalba, [place] 09/25/20 1532    Linwood Dibbles, MD 09/26/20 [phone]

## 2020-09-25 NOTE — ED Notes (Signed)
Called transport for d/c

## 2020-09-25 NOTE — Discharge Instructions (Signed)
Please follow-up with your spine doctor.  Your CT scan results were printed and given to you. You do not appear to have any fractures that are new today.  The abnormalities found on x-ray were likely degenerative changes. Please take Tylenol 500 mg - 1000 mg every 6 hours for pain.  If this is not sufficient you may use the Norco pain medicine that I prescribed you.  If you to use this please only use with 500mg /1 tablet of Tylenol.  Please make sure you are not taking more than 1 g / 1000 mg of Tylenol every 6 hours.  As it can make you feel weak and dizzy, please take with care.  Do not operate any heavy machinery or drive after taking this medicine.  Do not take with any other narcotic pain medicine such as tramadol.

## 2020-09-25 NOTE — ED Triage Notes (Addendum)
Pt came from [facility], assisted living. C/C: worsening chronic back pain upon exertion. Per EMS, back is tender upon palpation but otherwise intact. Pt repoerts that his feet were slipping when he tried to get out of bed this morning. He grabbed onto the bed and his walker, but couldn't pull himself up until after 4-5 attempts. He was able to pull himself up, but the worsening pain began at that time. Pt "wanted to come to make sure nothing is broken." Pt is ambulatory with a walker at baseline  A&O x 4 CBG: 108 174/76 70 18  96 RA 98.0

## 2020-09-25 NOTE — ED Notes (Signed)
Called report to [place], Charity fundraiser at [facility] for discharge

## 2020-09-28 ENCOUNTER — Telehealth: Payer: Self-pay | Admitting: Physical Medicine and Rehabilitation

## 2020-09-28 NOTE — Telephone Encounter (Signed)
Received referral from Dr. Ethelene Hal for lumbar ESI. Patient had right L5-S1 IL on 03/10/20. Please advise.

## 2020-09-28 NOTE — Telephone Encounter (Signed)
Ok, L5-S1 interlam ? BT, may do hip same day?

## 2020-09-29 NOTE — Telephone Encounter (Signed)
Scheduled for 2/22 at 1100 with driver and no blood thinners.

## 2020-10-19 ENCOUNTER — Ambulatory Visit: Payer: Medicare Other | Admitting: Physical Medicine and Rehabilitation

## 2020-11-18 ENCOUNTER — Encounter: Payer: Self-pay | Admitting: Physical Medicine and Rehabilitation

## 2020-11-18 ENCOUNTER — Ambulatory Visit (INDEPENDENT_AMBULATORY_CARE_PROVIDER_SITE_OTHER): Payer: Medicare Other | Admitting: Physical Medicine and Rehabilitation

## 2020-11-18 ENCOUNTER — Other Ambulatory Visit: Payer: Self-pay

## 2020-11-18 ENCOUNTER — Ambulatory Visit: Payer: Self-pay

## 2020-11-18 VITALS — BP 168/67 | HR 73

## 2020-11-18 DIAGNOSIS — M25511 Pain in right shoulder: Secondary | ICD-10-CM

## 2020-11-18 DIAGNOSIS — M5116 Intervertebral disc disorders with radiculopathy, lumbar region: Secondary | ICD-10-CM | POA: Diagnosis not present

## 2020-11-18 DIAGNOSIS — G8929 Other chronic pain: Secondary | ICD-10-CM | POA: Diagnosis not present

## 2020-11-18 DIAGNOSIS — M1611 Unilateral primary osteoarthritis, right hip: Secondary | ICD-10-CM | POA: Diagnosis not present

## 2020-11-18 DIAGNOSIS — M25551 Pain in right hip: Secondary | ICD-10-CM

## 2020-11-18 MED ORDER — BUPIVACAINE HCL 0.25 % IJ SOLN
4.0000 mL | INTRAMUSCULAR | Status: AC | PRN
Start: 2020-11-18 — End: 2020-11-18
  Administered 2020-11-18: 4 mL via INTRA_ARTICULAR

## 2020-11-18 MED ORDER — TRIAMCINOLONE ACETONIDE 40 MG/ML IJ SUSP
60.0000 mg | INTRAMUSCULAR | Status: AC | PRN
  Administered 2020-11-18: 60 mg via INTRA_ARTICULAR

## 2020-11-18 MED ORDER — BETAMETHASONE SOD PHOS & ACET 6 (3-3) MG/ML IJ SUSP
12.0000 mg | Freq: Once | INTRAMUSCULAR | Status: AC
Start: 2020-11-18 — End: 2020-11-18
  Administered 2020-11-18: 12 mg

## 2020-11-18 MED ORDER — BUPIVACAINE HCL 0.5 % IJ SOLN
3.0000 mL | INTRAMUSCULAR | Status: AC | PRN
  Administered 2020-11-18: 3 mL via INTRA_ARTICULAR

## 2020-11-18 NOTE — Progress Notes (Signed)
Right sided low back pain. Sometimes has pain in anterior right thigh. Right shoulder pain.  Numeric Pain Rating Scale and Functional Assessment Average Pain 8   In the last MONTH (on 0-10 scale) has pain interfered with the following?  1. General activity like being  able to carry out your everyday physical activities such as walking, climbing stairs, carrying groceries, or moving a chair?  Rating(6)   +Driver, -BT, -Dye Allergies.

## 2020-11-18 NOTE — Patient Instructions (Signed)

## 2020-11-18 NOTE — Progress Notes (Signed)
Ricky Sandoval - 87 y.o. male MRN [id number]  Date of birth: 14-Dec-1921  Office Visit Note: Visit Date: 11/18/2020 PCP: Chilton Greathouse, MD Referred by: Chilton Greathouse, MD  Subjective: Chief Complaint  Patient presents with  . Lower Back - Pain  . Right Shoulder - Pain   HPI:  Ricky Sandoval is a 87 y.o. male who comes in today At the request of Su Hoff, PA-C and Sheran Luz, MD for evaluation and management of chronic worsening severe low back right hip and thigh pain with request for possible right L5 and S1 transforaminal epidural steroid injection.  I last saw the patient in October and completed a right glenohumeral joint injection and intra-articular hip injection with really good relief.  He went to the emergency room at the end of January 2022 with right back and hip pain and at that time was given some medication and treatment and told to follow-up with Dr. Ethelene Hal.  It appears that he was seen and then referral was placed for repeat injection.  The referrals that we can see on our computer talk about epidural injection and/or hip injection.  The written referral note from [facility] talks about a right L5 and S1 transforaminal epidural steroid injection.  Today patient is having some right lower back pain but really pain across the groin into the anterior thigh to the knee.  Is worse with twisting and turning and weightbearing.  He is having no paresthesias.  No left-sided complaints.  His pain is 8 out of 10.  He reports that the prior injections he has had in the hip have done more for him than anything else.  He denies any focal weakness or new trauma or falls.  He continues with current medication management.  He also talks about right glenohumeral joint pain or right shoulder pain.  Worse at night.  No radicular component down the arm.  No left-sided complaints.  Prior fluoroscopic imaging of the right shoulder shows end-stage osteoarthritis of the right shoulder.  He has a  pretty incredibly bad right hip with end-stage osteoarthritis of the right hip.  Not a candidate for surgery given his age.  He does have spondylitic change in the lower spine he has multilevel findings and he could have problems from that as well but he is not endorse anything past the knee and again no paresthesias.  Review of Systems  Musculoskeletal: Positive for back pain and joint pain.  All other systems reviewed and are negative.  Otherwise per HPI.  Assessment & Plan: Visit Diagnoses:    ICD-10-CM   1. Pain in right hip  M25.551 XR C-ARM NO REPORT    betamethasone acetate-betamethasone sodium phosphate (CELESTONE) injection 12 mg    Large Joint Inj: R hip joint    Large Joint Inj: R glenohumeral  2. Unilateral primary osteoarthritis, right hip  M16.11 XR C-ARM NO REPORT    Large Joint Inj: R hip joint    Large Joint Inj: R glenohumeral  3. Chronic right shoulder pain  M25.511 XR C-ARM NO REPORT   G89.29 Large Joint Inj: R hip joint    Large Joint Inj: R glenohumeral  4. Radiculopathy due to lumbar intervertebral disc disorder  M51.16     Plan: Findings:  1.  Chronic worsening severe with one episode requiring emergency room visit for right hip and thigh pain.  His pain is multifactorial but it seems to be more related to the hip.  His hip is pretty significantly  osteoarthritic with no joint space left with some collapse.  No fracture.  He clearly has multilevel lumbar findings as well.  Today he is having pain with rotation of the hip pain with going from sit to stand.  I think at this point it would be best to complete right intra-articular hip injection instead of the epidural injection.  He can always return to see Dr. Ethelene Hal for the epidural injection if needed or return to see [place].  I really feel like clinically this is more hip related issue at this point today.  2.  In terms of his glenohumeral joint he does have end-stage arthritis of the right shoulder.  Last injection helped  him greatly.  He is getting a lot of difficulty sleeping at night with it.  No radicular complaints.  We will repeat the injection today into the right glenohumeral joint using fluoroscopic guidance.  He can follow-up with Dr. Ethelene Hal for further management of that if needed or one of the orthopedic surgeons in that practice.    Meds & Orders:  Meds ordered this encounter  Medications  . betamethasone acetate-betamethasone sodium phosphate (CELESTONE) injection 12 mg    Orders Placed This Encounter  Procedures  . Large Joint Inj: R hip joint  . Large Joint Inj: R glenohumeral  . XR C-ARM NO REPORT    Follow-up: Return for visit to requesting physician as needed.   Procedures: Large Joint Inj: R hip joint on 11/18/2020 1:14 PM Indications: diagnostic evaluation and pain Details: 22 G 3.5 in needle, fluoroscopy-guided anterior approach  Arthrogram: No  Medications: 4 mL bupivacaine 0.25 %; 60 mg triamcinolone acetonide 40 MG/ML Outcome: tolerated well, no immediate complications  There was excellent flow of contrast producing a partial arthrogram of the hip. The patient did have relief of symptoms during the anesthetic phase of the injection. Procedure, treatment alternatives, risks and benefits explained, specific risks discussed. Consent was given by the patient. Immediately prior to procedure a time out was called to verify the correct patient, procedure, equipment, support staff and site/side marked as required. Patient was prepped and draped in the usual sterile fashion.   Large Joint Inj: R glenohumeral on 11/18/2020 1:14 PM Indications: pain and diagnostic evaluation Details: 22 G 3.5 in needle, fluoroscopy-guided anteromedial approach  Arthrogram: No  Medications: 3 mL bupivacaine 0.5 %; 60 mg triamcinolone acetonide 40 MG/ML Outcome: tolerated well, no immediate complications  There was excellent flow of contrast producing a partial arthrogram of the glenohumeral joint. The  patient did have relief of symptoms during the anesthetic phase of the injection. Procedure, treatment alternatives, risks and benefits explained, specific risks discussed. Consent was given by the patient. Immediately prior to procedure a time out was called to verify the correct patient, procedure, equipment, support staff and site/side marked as required. Patient was prepped and draped in the usual sterile fashion.          Clinical History: No specialty comments available.     Objective:  VS:  HT:    WT:   BMI:     BP:(!) 168/67  HR:73bpm  TEMP: ( )  RESP:  Physical Exam Vitals and nursing note reviewed.  Constitutional:      General: He is not in acute distress.    Appearance: Normal appearance. He is not ill-appearing.  HENT:     Head: Normocephalic and atraumatic.     Right Ear: External ear normal.     Left Ear: External ear normal.  Nose: No congestion.  Eyes:     Extraocular Movements: Extraocular movements intact.  Cardiovascular:     Rate and Rhythm: Normal rate.     Pulses: Normal pulses.  Pulmonary:     Effort: Pulmonary effort is normal. No respiratory distress.  Abdominal:     General: There is no distension.     Palpations: Abdomen is soft.  Musculoskeletal:        General: No tenderness or signs of injury.     Cervical back: Neck supple.     Right lower leg: No edema.     Left lower leg: No edema.     Comments: Patient has good distal strength without clonus.  He has concordant right hip and low back pain with rotation internally of the right hip.  He has no pain with left hip rotation.  He has decreased range of motion the right shoulder with impingement of the right shoulder.  This does reproduce his pain.  No focal trigger points.  Patient has good strength in the upper extremities bilaterally.  Skin:    Findings: No erythema or rash.  Neurological:     General: No focal deficit present.     Mental Status: He is alert and oriented to person,  place, and time.     Sensory: No sensory deficit.     Motor: No weakness or abnormal muscle tone.     Coordination: Coordination normal.     Gait: Gait abnormal.  Psychiatric:        Mood and Affect: Mood normal.        Behavior: Behavior normal.      Imaging: No results found.

## 2020-11-27 IMAGING — CR DG HIP (WITH OR WITHOUT PELVIS) 2-3V*R*
3 series · 3 of 3 positions shown · non-contrast
Comparison: Pelvic radiograph dated 11/03/2015

CLINICAL DATA: [AGE] male with fall and right hip pain.

EXAM:
DG HIP (WITH OR WITHOUT PELVIS) 2-3V RIGHT

[x pelvis (1 of 2)]
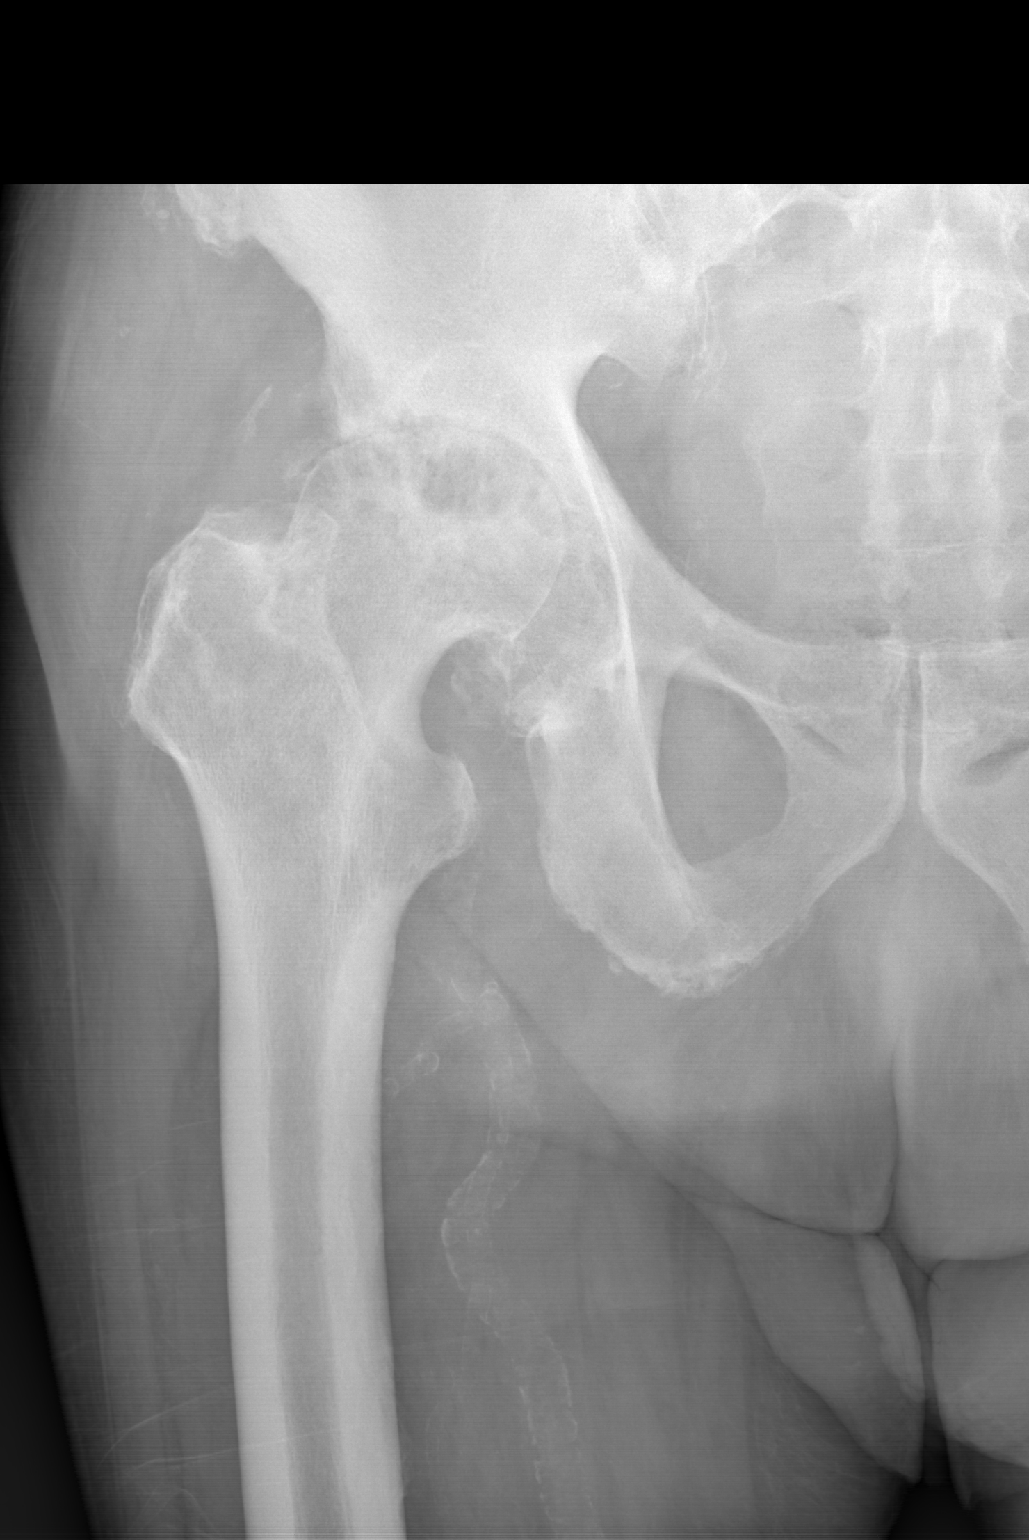

[x pelvis (2 of 2)]
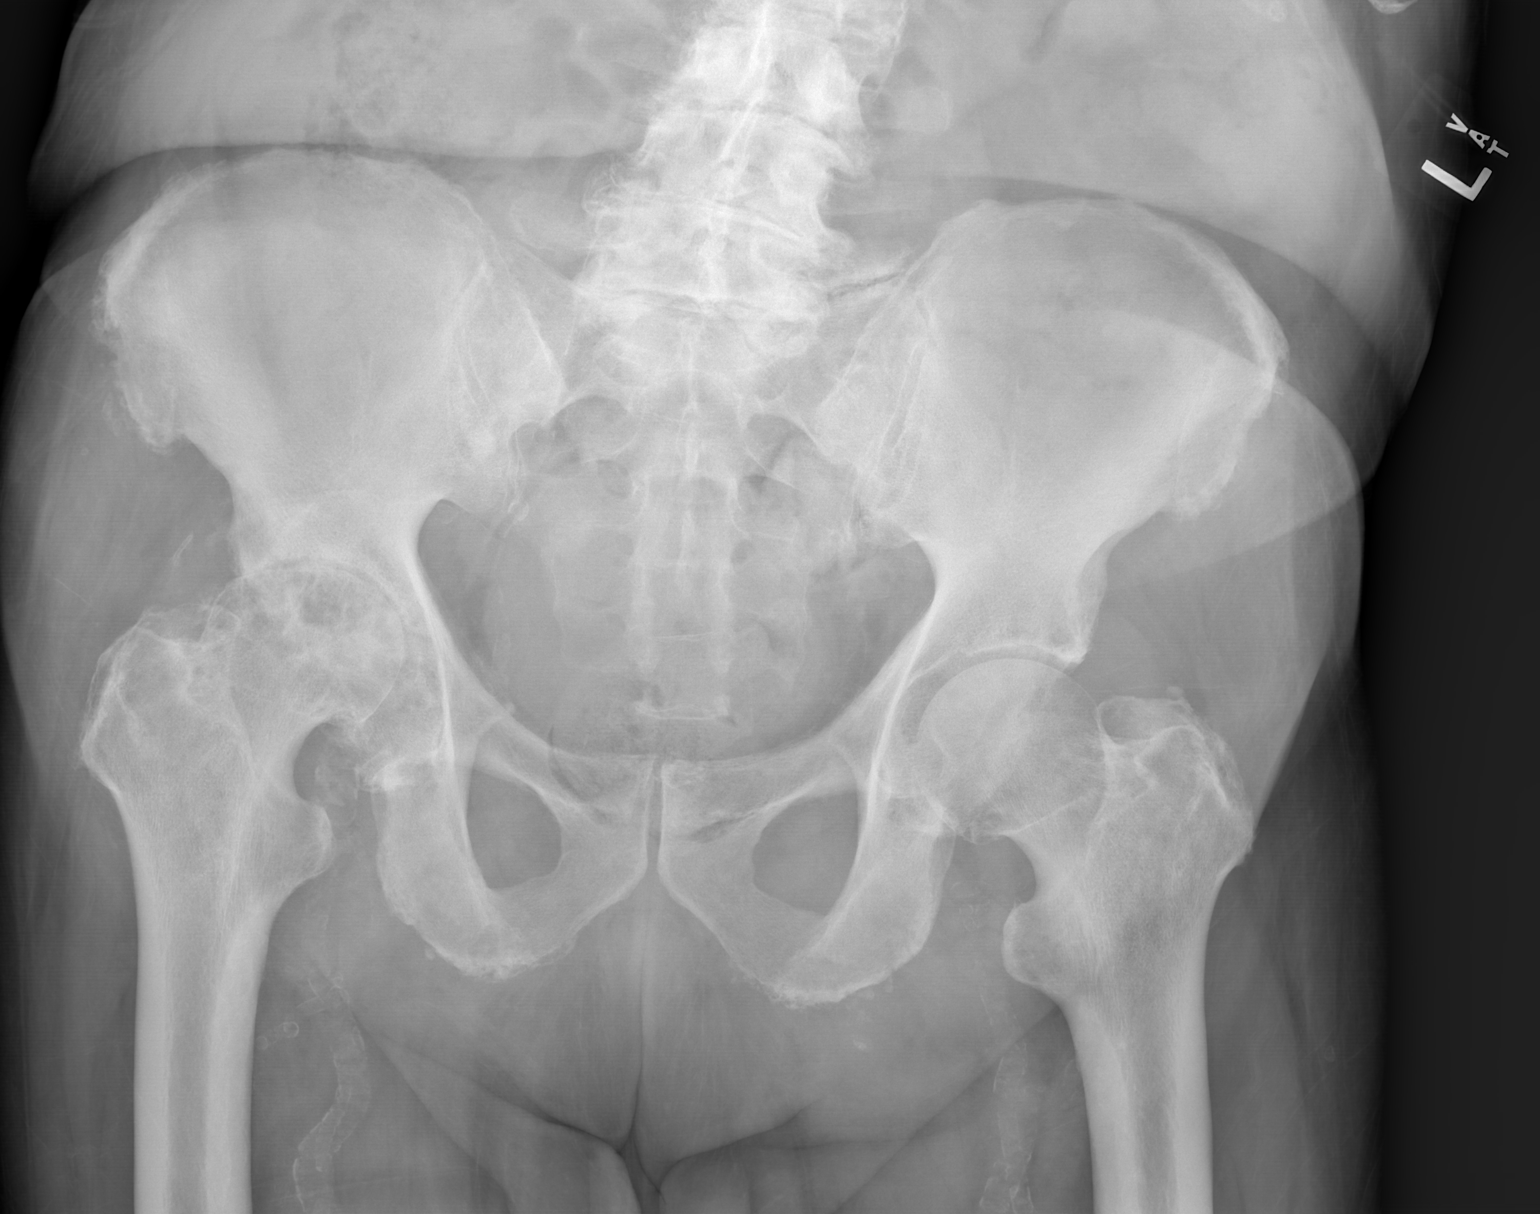

[w hip lat right]
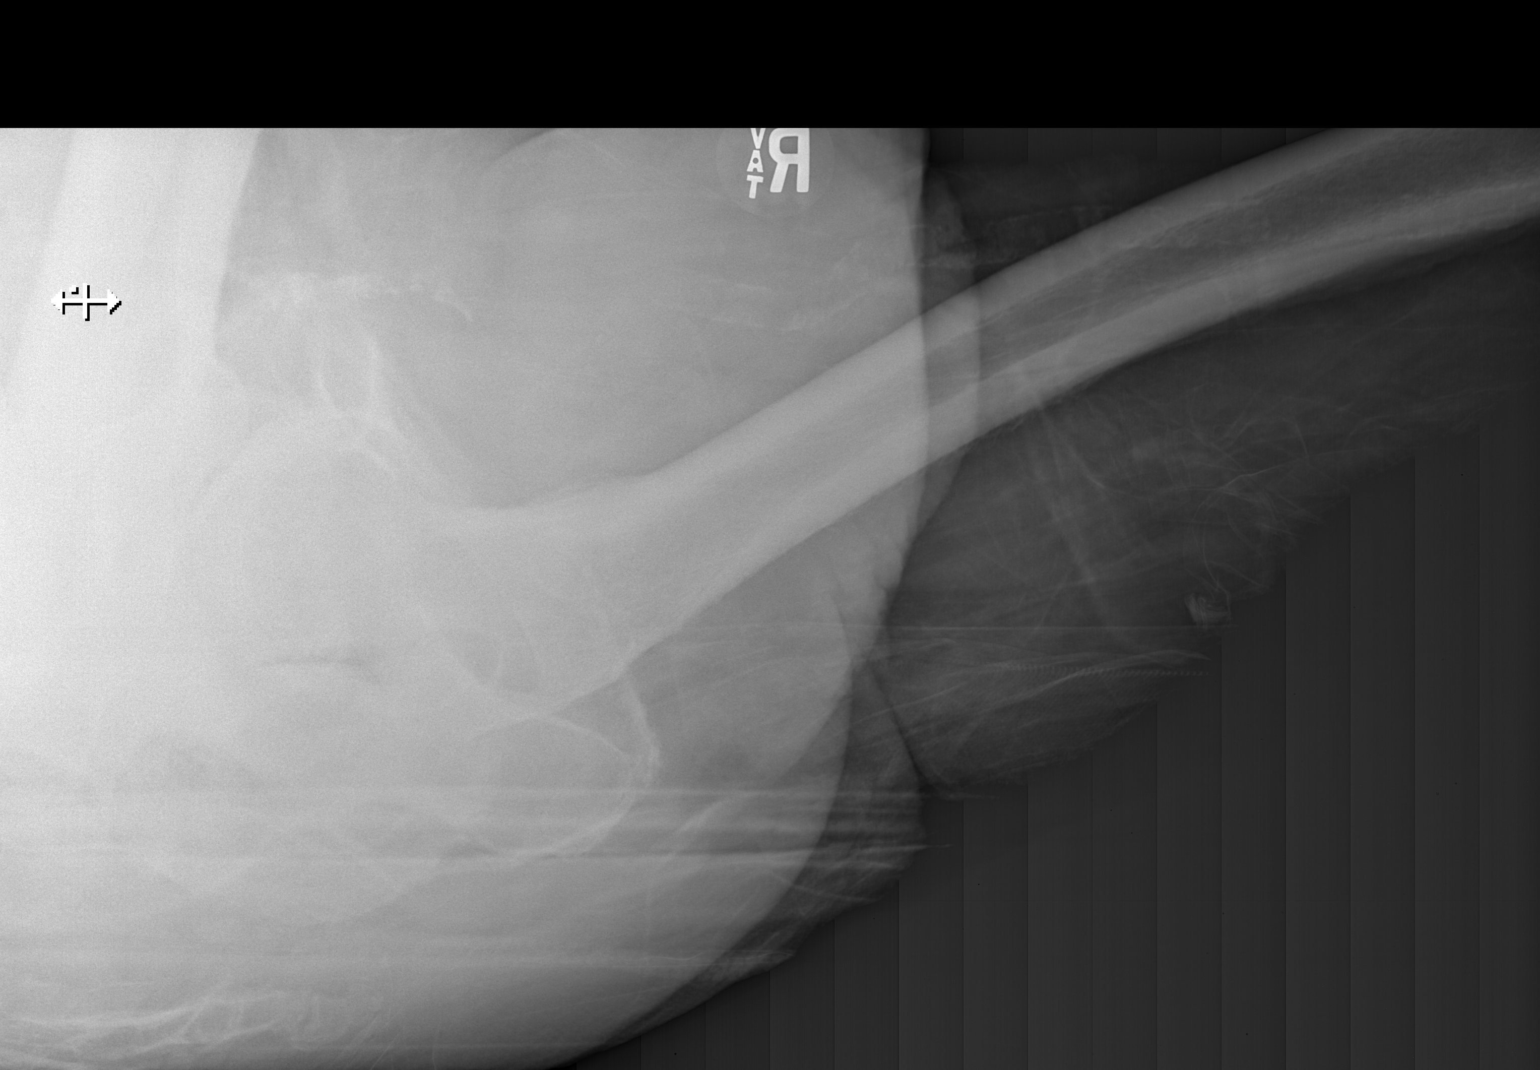

[3 of 3 positions shown; findings below may reference images not displayed]

FINDINGS: There is no acute fracture or dislocation. There is severe arthritic
changes of the right hip with severe narrowing of the
femoroacetabular joint space and near bone-on-bone contact
superiorly. There is subcortical cystic changes of the right femoral
head and acetabulum. Degenerative changes of the lower lumbar spine.
The soft tissues are unremarkable. Vascular calcifications noted.
IMPRESSION: 1. No acute fracture or dislocation.
2. Severe arthritic changes of the right hip, significantly
progressed since the radiograph of 5950.

## 2020-11-27 IMAGING — CT CT MAXILLOFACIAL W/O CM
3 series · 16 of 47 positions shown, 19 images · non-contrast
Comparison: None.

CLINICAL DATA: [AGE] male with facial trauma.

EXAM:
CT HEAD WITHOUT CONTRAST
CT MAXILLOFACIAL WITHOUT CONTRAST
TECHNIQUE: Multidetector CT imaging of the head and maxillofacial structures
were performed using the standard protocol without intravenous
contrast. Multiplanar CT image reconstructions of the maxillofacial
structures were also generated.

[Series 3: max soft · axial · 0.35mm/px · z∈[+1228,+1364]mm · 10 of 80 slices shown, 13 images]
[im 6/80  brain]
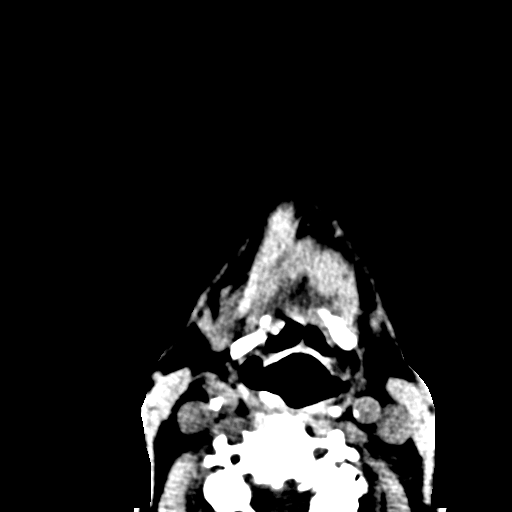
[im 6/80  bone]
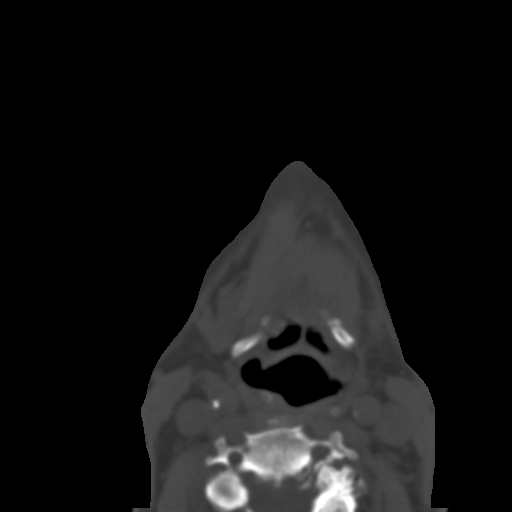
[im 14/80  bone]
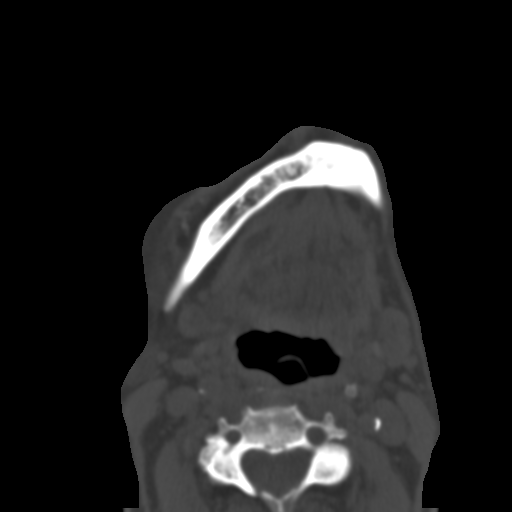
[im 22/80  bone]
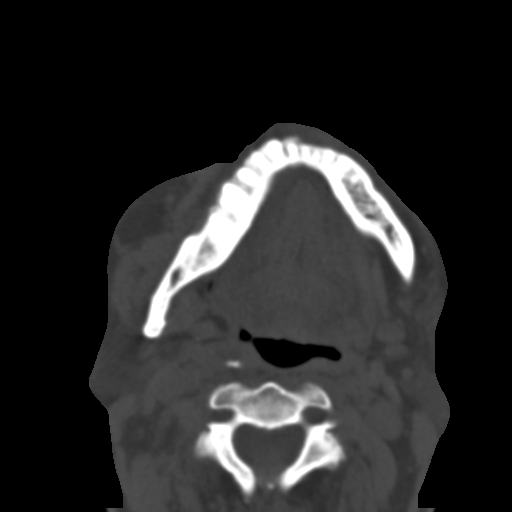
[im 28/80  bone]
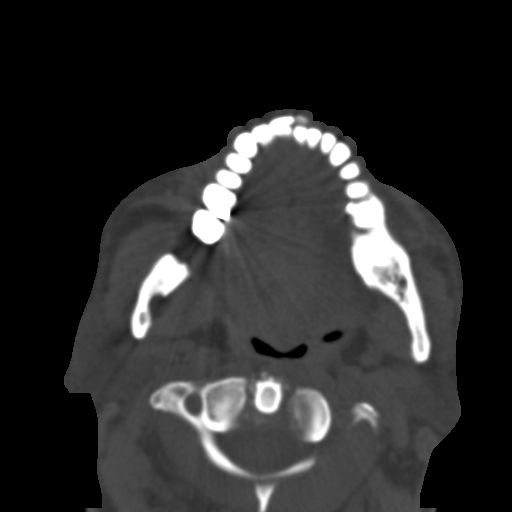
[im 36/80  brain]
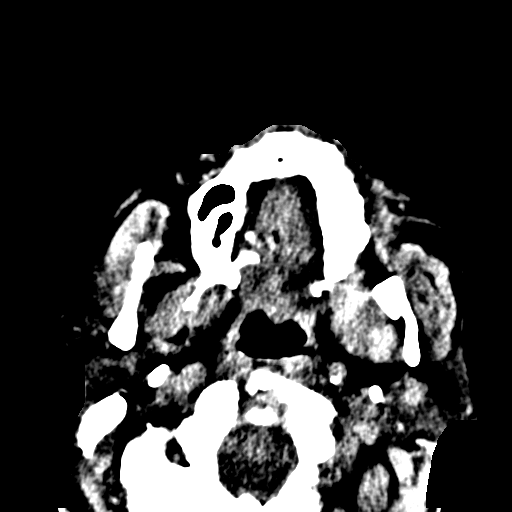
[im 36/80  bone]
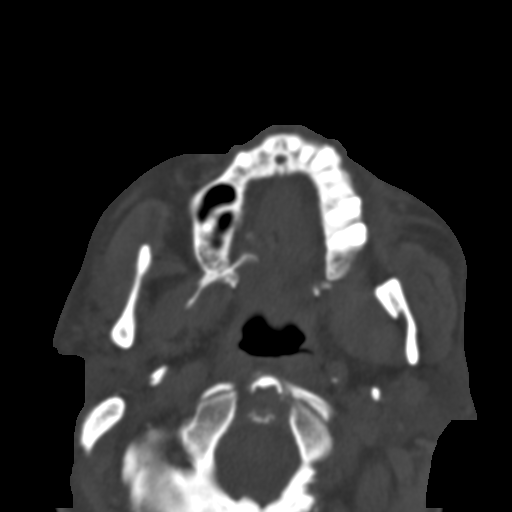
[im 44/80  bone]
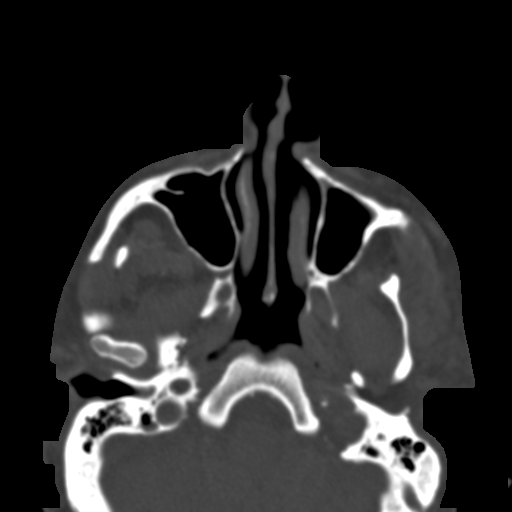
[im 52/80  bone]
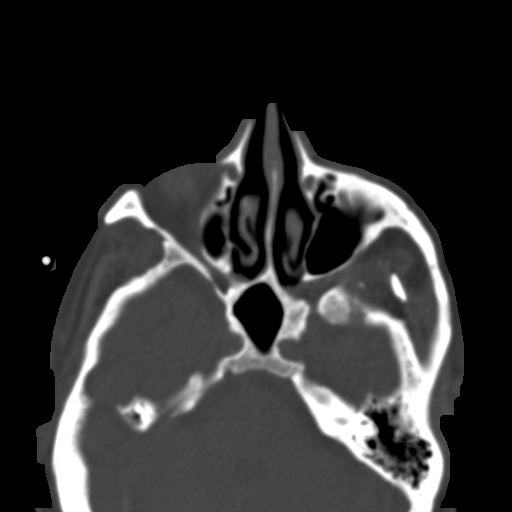
[im 60/80  bone]
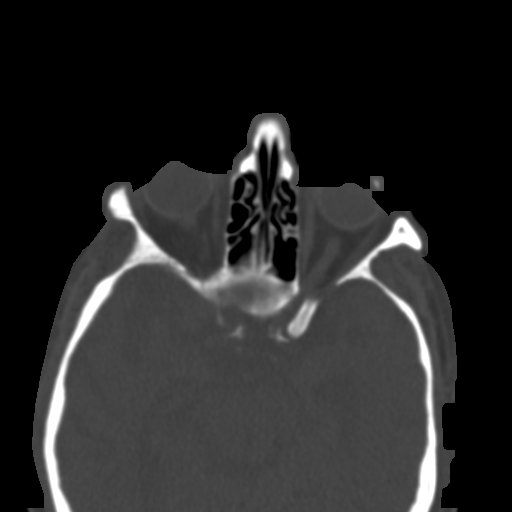
[im 66/80  brain]
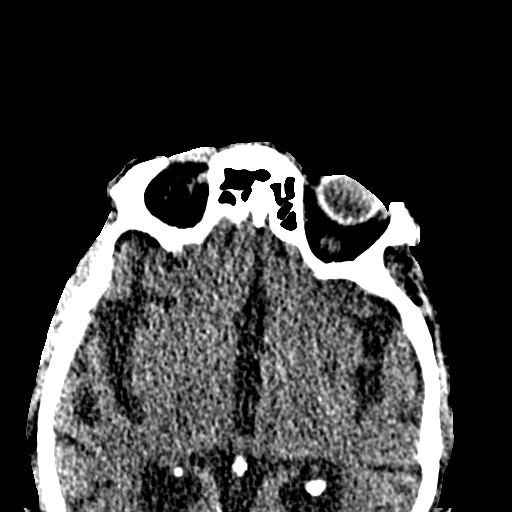
[im 66/80  bone]
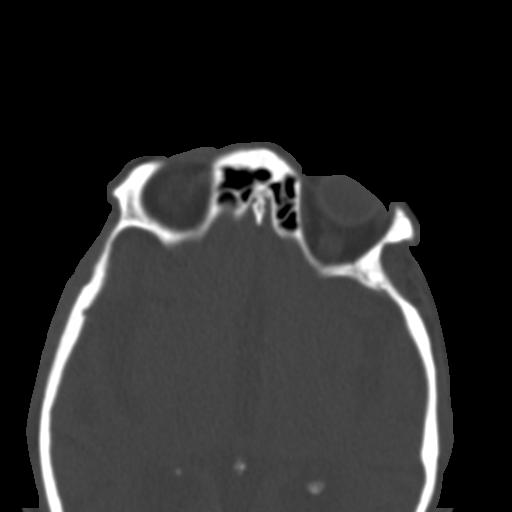
[im 74/80  bone]
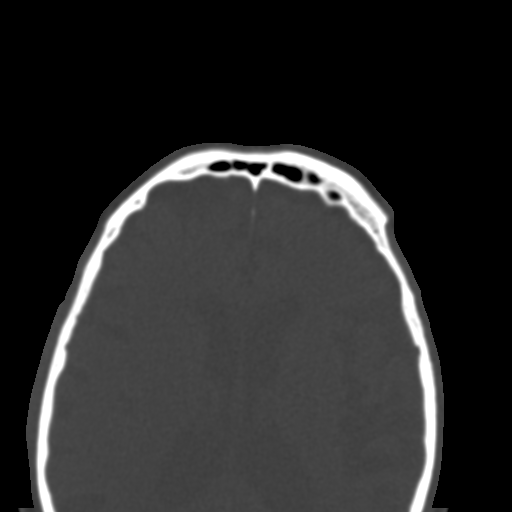

[Series 7: coronal soft · coronal · 0.34mm/px · 3 of 75 slices shown]
[im 25/75  bone]
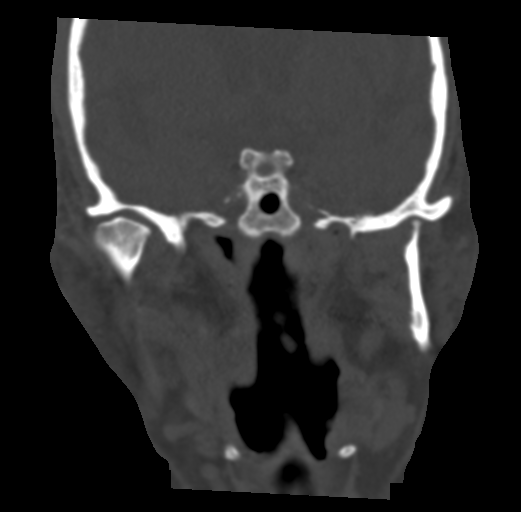
[im 33/75  bone]
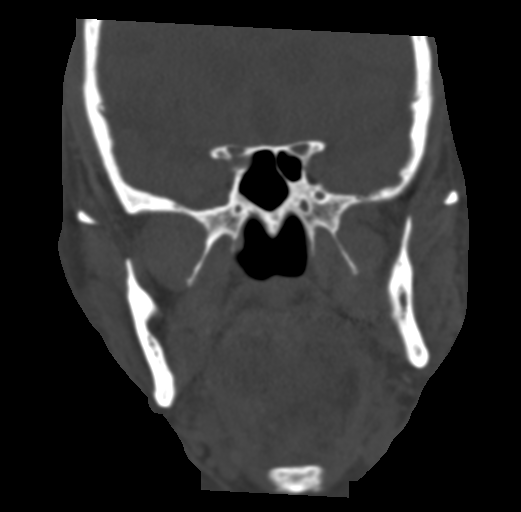
[im 42/75  bone]
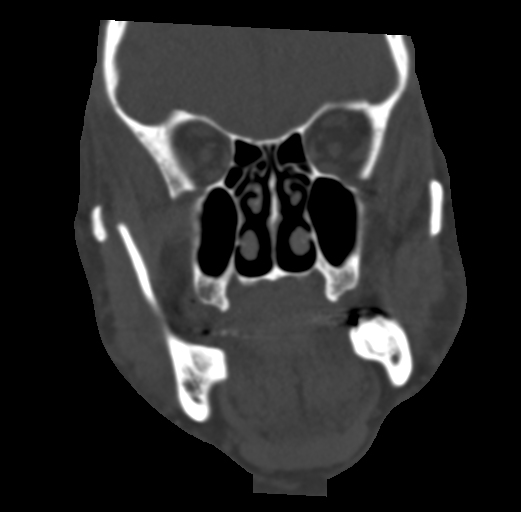

[Series 8: sagittal soft · sagittal · 0.30mm/px · 3 of 90 slices shown]
[im 30/90  bone]
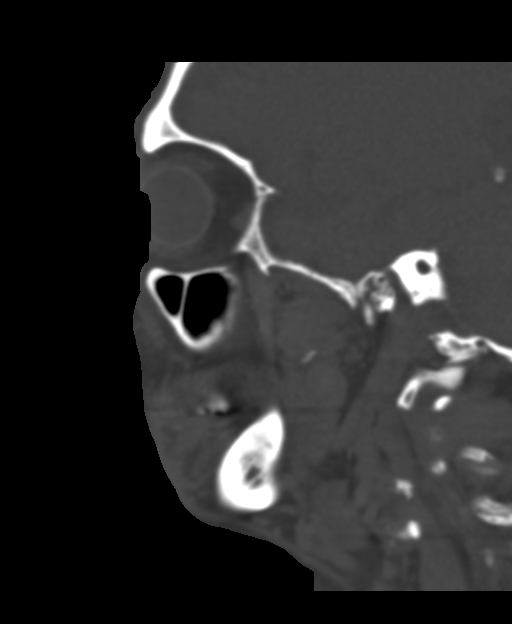
[im 45/90  bone]
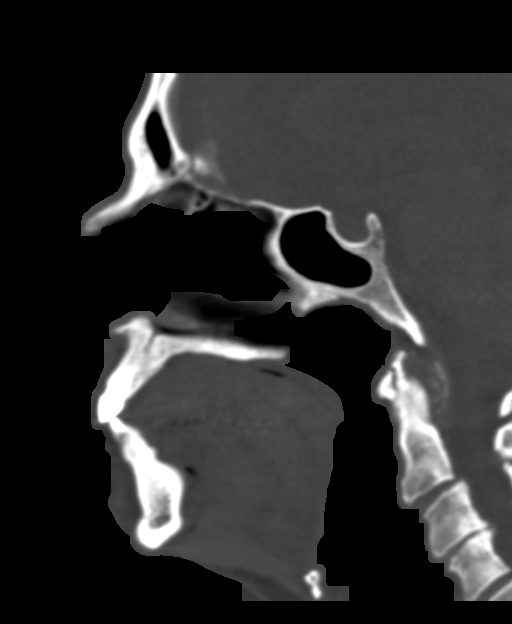
[im 60/90  bone]
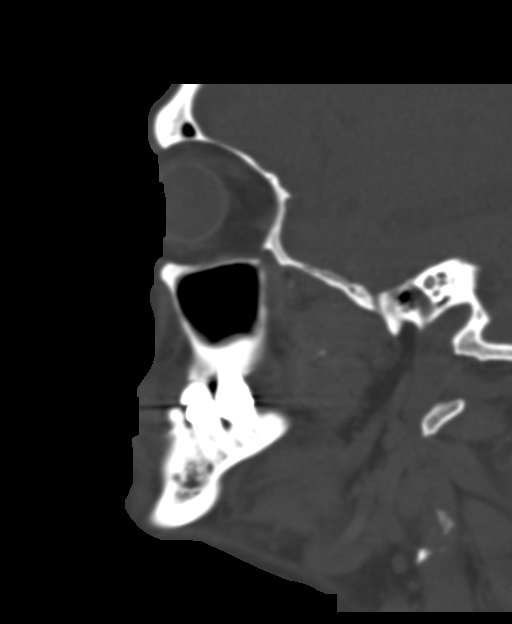

[16 of 47 positions shown; findings below may reference images not displayed]

FINDINGS: CT HEAD FINDINGS

Brain: There is moderate age-related atrophy and chronic
microvascular ischemic changes. There is no acute intracranial
hemorrhage. No mass effect or midline shift. No extra-axial fluid
collection.

Vascular: No hyperdense vessel or unexpected calcification.

Skull: Normal. Negative for fracture or focal lesion.

Other: Right forehead skin laceration. No large hematoma.

CT MAXILLOFACIAL FINDINGS

Osseous: No acute fracture. No mandibular subluxation. Degenerative
changes of the left TMJ.

Orbits: The globes and retro-orbital fat are preserved.

Sinuses: Clear.

Soft tissues: Negative.
IMPRESSION: 1. No acute intracranial hemorrhage.
2. No acute/traumatic facial bone fractures.

## 2021-01-10 ENCOUNTER — Emergency Department (HOSPITAL_COMMUNITY): Payer: Medicare Other

## 2021-01-10 ENCOUNTER — Emergency Department (HOSPITAL_COMMUNITY)
Admission: EM | Admit: 2021-01-10 | Discharge: 2021-01-10 | Disposition: A | Discharge status: Home / Self Care | Payer: Medicare Other | Attending: Emergency Medicine | Admitting: Emergency Medicine

## 2021-01-10 DIAGNOSIS — W06XXXA Fall from bed, initial encounter: Secondary | ICD-10-CM | POA: Insufficient documentation

## 2021-01-10 DIAGNOSIS — E039 Hypothyroidism, unspecified: Secondary | ICD-10-CM | POA: Diagnosis not present

## 2021-01-10 DIAGNOSIS — W19XXXA Unspecified fall, initial encounter: Secondary | ICD-10-CM

## 2021-01-10 DIAGNOSIS — E119 Type 2 diabetes mellitus without complications: Secondary | ICD-10-CM | POA: Insufficient documentation

## 2021-01-10 DIAGNOSIS — I10 Essential (primary) hypertension: Secondary | ICD-10-CM | POA: Diagnosis not present

## 2021-01-10 DIAGNOSIS — Z79899 Other long term (current) drug therapy: Secondary | ICD-10-CM | POA: Insufficient documentation

## 2021-01-10 DIAGNOSIS — S0990XA Unspecified injury of head, initial encounter: Secondary | ICD-10-CM

## 2021-01-10 DIAGNOSIS — S7001XA Contusion of right hip, initial encounter: Secondary | ICD-10-CM | POA: Diagnosis not present

## 2021-01-10 DIAGNOSIS — S79911A Unspecified injury of right hip, initial encounter: Secondary | ICD-10-CM | POA: Diagnosis present

## 2021-01-10 DIAGNOSIS — Z7982 Long term (current) use of aspirin: Secondary | ICD-10-CM | POA: Insufficient documentation

## 2021-01-10 MED ORDER — ACETAMINOPHEN 325 MG PO TABS
650.0000 mg | ORAL_TABLET | Freq: Once | ORAL | Status: AC
  Administered 2021-01-10: 650 mg via ORAL
  Filled 2021-01-10: qty 2

## 2021-01-10 NOTE — ED Triage Notes (Signed)
Pt bib ems from [facility] after falling out of his bed. Pt states he hit his head on a step stool when he fell out of bed. Pt A&ox3 at baseline. Small abrasion to head. Denies blood thinners.

## 2021-01-10 NOTE — ED Provider Notes (Signed)
[facility] EMERGENCY DEPARTMENT Provider Note   CSN: [id number] Arrival date & time: 01/10/21  0058     History Chief Complaint  Patient presents with  . Fall    Ricky Sandoval is a 87 y.o. male.  Patient presents via EMS after falling at his living facility.  States he went to grab his walker which was not in expected location and he fell forward striking his head and falling out of bed.  Complains of pain to his right head, right neck and right low back and hip.  Denies losing consciousness.  No blood thinner use.  No vomiting.  States his walker was not where he expected to be and he fell forward and his head struck a block that he uses a step in bed. Complains of tailbone pain and right hip pain as well as pain to the posterior right head.  The history is provided by the patient and the EMS personnel.  Fall Associated symptoms include headaches. Pertinent negatives include no chest pain, no abdominal pain and no shortness of breath.       Past Medical History:  Diagnosis Date  . Diabetes type 2, controlled (HCC)   . High cholesterol   . Hypertension   . Hypothyroid     There are no problems to display for this patient.   No past surgical history on file.     No family history on file.  Social History   Tobacco Use  . Smoking status: Unknown If Ever Smoked  Substance Use Topics  . Alcohol use: Not Currently  . Drug use: Never    Home Medications Prior to Admission medications   Medication Sig Start Date End Date Taking? Authorizing Provider  acetaminophen (TYLENOL) 650 MG CR tablet Take 1,300 mg by mouth 2 (two) times daily.    [provider]  aspirin 81 MG tablet Take 81 mg by mouth daily.    [provider]  caffeine 200 MG TABS tablet Take 400 mg by mouth every morning.    [provider]  Coenzyme Q10-Fish Oil-Vit E (CO-Q 10 OMEGA-3 FISH OIL PO) Take 2 tablets by mouth daily.    [provider]   docusate sodium (COLACE) 100 MG capsule Take 300 mg by mouth daily.    [provider]  ferrous sulfate 325 (65 FE) MG tablet Take 325 mg by mouth daily with breakfast.    [provider]  flunisolide (NASALIDE) 25 MCG/ACT (0.025%) SOLN Place 2 sprays into the nose 2 (two) times daily as needed (allergies).    [provider]  gabapentin (NEURONTIN) 100 MG capsule Take 100 mg by mouth at bedtime.    [provider]  Ginkgo Biloba 60 MG CAPS Take 3 capsules by mouth 2 (two) times daily.    [provider]  Homeopathic Products (LEG CRAMP RELIEF PO) Take 1 tablet by mouth daily as needed (leg cramps). Leg cramps/quinine    [provider]  HYDROcodone-acetaminophen (NORCO/VICODIN) 5-325 MG tablet Take 1 tablet by mouth every 6 (six) hours as needed for severe pain. 09/25/20   Fondaw, Wylder S, PA  KRILL OIL PO Take 500 mg by mouth daily.    [provider]  levothyroxine (SYNTHROID, LEVOTHROID) 75 MCG tablet Take 75 mcg by mouth daily before breakfast.    [provider]  loratadine (CLARITIN) 10 MG tablet Take 10 mg by mouth daily.    [provider]  losartan (COZAAR) 100 MG tablet  Take 100 mg by mouth 2 (two) times daily.    [provider]  Lutein 20 MG CAPS Take 1 capsule by mouth 2 (two) times daily.    [provider]  Misc Natural Products (OSTEO BI-FLEX JOINT SHIELD PO) Take 1 tablet by mouth 2 (two) times daily.    [provider]  Multiple Vitamins-Minerals (CENTRUM SILVER PO) Take 1 tablet by mouth daily.    [provider]  omeprazole (PRILOSEC) 20 MG capsule Take 20 mg by mouth daily as needed (indigestion/heartburn).    [provider]  pyridOXINE (VITAMIN B-6) 100 MG tablet Take 100 mg by mouth daily.    [provider]  simvastatin (ZOCOR) 40 MG tablet Take 20 mg by mouth at bedtime.    [provider]  tamsulosin (FLOMAX) 0.4 MG CAPS capsule  Take 0.4 mg by mouth at bedtime.    [provider]  vitamin B-12 (CYANOCOBALAMIN) 1000 MCG tablet Take 1,000 mcg by mouth daily.    [provider]  vitamin C (ASCORBIC ACID) 500 MG tablet Take 500 mg by mouth daily.    [provider]    Allergies    Patient has no known allergies.  Review of Systems   Review of Systems  Constitutional: Negative for activity change, appetite change and fever.  HENT: Negative for congestion and rhinorrhea.   Respiratory: Negative for cough, chest tightness and shortness of breath.   Cardiovascular: Negative for chest pain.  Gastrointestinal: Negative for abdominal pain, nausea and vomiting.  Genitourinary: Negative for dysuria.  Musculoskeletal: Positive for arthralgias and myalgias.  Skin: Negative for rash.  Neurological: Positive for headaches. Negative for weakness.   all other systems are negative except as noted in the HPI and PMH.   Physical Exam Updated Vital Signs BP (!) 135/50 (BP Location: Left Arm)   Pulse 66   Temp 98 F (36.7 C) (Oral)   Resp 18   SpO2 93%   Physical Exam Vitals and nursing note reviewed.  Constitutional:      General: He is not in acute distress.    Appearance: He is well-developed.  HENT:     Head: Normocephalic and atraumatic.     Mouth/Throat:     Pharynx: No oropharyngeal exudate.  Eyes:     Conjunctiva/sclera: Conjunctivae normal.     Pupils: Pupils are equal, round, and reactive to light.  Neck:     Comments: Paraspinal C spine tenderness. No midline tenderness Cardiovascular:     Rate and Rhythm: Normal rate and regular rhythm.     Heart sounds: Normal heart sounds. No murmur heard.   Pulmonary:     Effort: Pulmonary effort is normal. No respiratory distress.     Breath sounds: Normal breath sounds.  Abdominal:     Palpations: Abdomen is soft.     Tenderness: There is no abdominal tenderness. There is no guarding or rebound.  Musculoskeletal:        General:  Tenderness present. Normal range of motion.     Cervical back: Normal range of motion and neck supple.     Comments: Midline lumbar spine pain, no step-off. Right leg appears shortened compared to left but no significant hip pain with range of motion. Intact DP and PT pulses bilaterally.  Skin:    General: Skin is warm.  Neurological:     Mental Status: He is alert and oriented to person, place, and time.     Cranial Nerves: No cranial nerve deficit.  Motor: No abnormal muscle tone.     Coordination: Coordination normal.     Comments:  5/5 strength throughout. CN 2-12 intact.Equal grip strength.   Psychiatric:        Behavior: Behavior normal.     ED Results / Procedures / Treatments   Labs (all labs ordered are listed, but only abnormal results are displayed) Labs Reviewed - No data to display  EKG None  Radiology DG Chest 2 View  Result Date: 01/10/2021 CLINICAL DATA:  Fall, chest injury EXAM: CHEST - 2 VIEW COMPARISON:  06/10/2019 FINDINGS: Lungs volumes are small, but are symmetric and are clear save for minimal left basilar atelectasis. No pneumothorax or pleural effusion. Cardiac size within normal limits. Pulmonary vascularity is normal. Osseous structures are age-appropriate. No acute bone abnormality. IMPRESSION: No active cardiopulmonary disease. Electronically Signed   By: Helyn Numbers MD   On: 01/10/2021 04:29   DG Lumbar Spine 2-3 Views  Result Date: 01/10/2021 CLINICAL DATA:  Fall, back pain EXAM: LUMBAR SPINE - 2-3 VIEW COMPARISON:  09/25/2020 FINDINGS: Stable moderate levoscoliosis of the lumbar spine, apex left at L2. Stable alignment. No listhesis. There is diffuse intervertebral disc space narrowing and endplate remodeling throughout the lumbar spine in keeping with changes of severe degenerative disc disease. Superior endplate fracture of T12 and L2 appears stable since prior examination. No acute fracture of the lumbar spine. Remaining vertebral body height  has been preserved. Vascular calcifications are seen within the abdominal aorta. The paraspinal soft tissues are otherwise unremarkable. IMPRESSION: No acute fracture or listhesis. Stable diffuse, severe degenerative disc disease throughout the lumbar spine with superimposed moderate lumbar levoscoliosis. Electronically Signed   By: Helyn Numbers MD   On: 01/10/2021 04:33   CT Head Wo Contrast  Result Date: 01/10/2021 CLINICAL DATA:  Fall, head injury EXAM: CT HEAD WITHOUT CONTRAST CT CERVICAL SPINE WITHOUT CONTRAST TECHNIQUE: Multidetector CT imaging of the head and cervical spine was performed following the standard protocol without intravenous contrast. Multiplanar CT image reconstructions of the cervical spine were also generated. COMPARISON:  CT head 06/10/2019 FINDINGS: CT HEAD FINDINGS Brain: Normal anatomic configuration. Parenchymal volume loss is commensurate with the patient's age. Moderate periventricular white matter changes are present likely reflecting the sequela of small vessel ischemia. No abnormal intra or extra-axial mass lesion or fluid collection. No abnormal mass effect or midline shift. No evidence of acute intracranial hemorrhage or infarct. Ventricular size is normal. Cerebellum unremarkable. Vascular: No asymmetric hyperdense vasculature at the skull base. Skull: Intact Sinuses/Orbits: Paranasal sinuses are clear. Orbits are unremarkable. Other: Mastoid air cells and middle ear cavities are clear. CT CERVICAL SPINE FINDINGS Alignment: 3 mm anterolisthesis of C2 upon C3, C4 upon C5, and 2 mm retrolisthesis of C6 upon C7 are likely degenerative in nature. Skull base and vertebrae: Craniocervical alignment is normal. Advanced degenerative changes are seen involving the cruciform ligament complex. The atlantodental interval is not widened. There is no acute fracture of the cervical spine. Soft tissues and spinal canal: No prevertebral fluid or swelling. No visible canal hematoma. Disc  levels: There is intervertebral disc space narrowing and endplate remodeling at C5-6 and C6-7 in keeping with changes of advanced degenerative disc disease. Moderate degenerative changes are noted at C3-4. Remaining intervertebral disc heights are preserved. The prevertebral soft tissues are not thickened. The spinal canal appears mildly narrowed posterior to C6-7 secondary to retrolisthesis superimposed upon a a posterior disc osteophyte complex which abuts and minimally flattens the thecal sac. There is  multilevel advanced facet and uncovertebral arthrosis resulting in multilevel neuroforaminal narrowing, most severe bilaterally at C3-4, on the left at C4-5, on the right at C5-6, and on the left at C6-7. Upper chest: Unremarkable Other: Advanced vascular calcifications are noted within the a cervical carotid arteries though the degree of stenosis is not well assessed on this noncontrast examination. IMPRESSION: No acute intracranial injury.  No calvarial fracture. Stable senescent change. No acute fracture or listhesis of the cervical spine. Advanced multilevel degenerative disc and degenerative joint disease resulting in mild central canal stenosis at C6-7 and multilevel neuroforaminal narrowing as described above. Electronically Signed   By: Helyn Numbers MD   On: 01/10/2021 04:47   CT Cervical Spine Wo Contrast  Result Date: 01/10/2021 CLINICAL DATA:  Fall, head injury EXAM: CT HEAD WITHOUT CONTRAST CT CERVICAL SPINE WITHOUT CONTRAST TECHNIQUE: Multidetector CT imaging of the head and cervical spine was performed following the standard protocol without intravenous contrast. Multiplanar CT image reconstructions of the cervical spine were also generated. COMPARISON:  CT head 06/10/2019 FINDINGS: CT HEAD FINDINGS Brain: Normal anatomic configuration. Parenchymal volume loss is commensurate with the patient's age. Moderate periventricular white matter changes are present likely reflecting the sequela of small  vessel ischemia. No abnormal intra or extra-axial mass lesion or fluid collection. No abnormal mass effect or midline shift. No evidence of acute intracranial hemorrhage or infarct. Ventricular size is normal. Cerebellum unremarkable. Vascular: No asymmetric hyperdense vasculature at the skull base. Skull: Intact Sinuses/Orbits: Paranasal sinuses are clear. Orbits are unremarkable. Other: Mastoid air cells and middle ear cavities are clear. CT CERVICAL SPINE FINDINGS Alignment: 3 mm anterolisthesis of C2 upon C3, C4 upon C5, and 2 mm retrolisthesis of C6 upon C7 are likely degenerative in nature. Skull base and vertebrae: Craniocervical alignment is normal. Advanced degenerative changes are seen involving the cruciform ligament complex. The atlantodental interval is not widened. There is no acute fracture of the cervical spine. Soft tissues and spinal canal: No prevertebral fluid or swelling. No visible canal hematoma. Disc levels: There is intervertebral disc space narrowing and endplate remodeling at C5-6 and C6-7 in keeping with changes of advanced degenerative disc disease. Moderate degenerative changes are noted at C3-4. Remaining intervertebral disc heights are preserved. The prevertebral soft tissues are not thickened. The spinal canal appears mildly narrowed posterior to C6-7 secondary to retrolisthesis superimposed upon a a posterior disc osteophyte complex which abuts and minimally flattens the thecal sac. There is multilevel advanced facet and uncovertebral arthrosis resulting in multilevel neuroforaminal narrowing, most severe bilaterally at C3-4, on the left at C4-5, on the right at C5-6, and on the left at C6-7. Upper chest: Unremarkable Other: Advanced vascular calcifications are noted within the a cervical carotid arteries though the degree of stenosis is not well assessed on this noncontrast examination. IMPRESSION: No acute intracranial injury.  No calvarial fracture. Stable senescent change. No  acute fracture or listhesis of the cervical spine. Advanced multilevel degenerative disc and degenerative joint disease resulting in mild central canal stenosis at C6-7 and multilevel neuroforaminal narrowing as described above. Electronically Signed   By: Helyn Numbers MD   On: 01/10/2021 04:47   CT PELVIS WO CONTRAST  Result Date: 01/10/2021 CLINICAL DATA:  Fall, pelvic trauma EXAM: CT PELVIS WITHOUT CONTRAST TECHNIQUE: Multidetector CT imaging of the pelvis was performed following the standard protocol without intravenous contrast. COMPARISON:  None. FINDINGS: Urinary Tract:  No abnormality visualized. Bowel: Mild sigmoid diverticulosis. The visualized bowel is otherwise unremarkable. No free  intraperitoneal gas or fluid within the pelvis. Vascular/Lymphatic: Moderate aortoiliac atherosclerotic calcification. No aneurysm. No pathologic adenopathy within the pelvis. Reproductive: The prostate gland and seminal vesicles are unremarkable. Other:  No abdominal wall hernia identified.  Rectum unremarkable. Musculoskeletal: Advanced degenerative changes are seen within the lumbar spine and right hip. Multifactorial degenerative change results in severe central canal stenosis at L3-4, L4-5, and L5-S1. Mild degenerative changes are noted within the left hip. No acute fracture or dislocation. IMPRESSION: No acute pelvic pathology identified. Mild sigmoid diverticulosis. Advanced asymmetric right hip degenerative arthritis. Advanced degenerative arthritis within the visualized lumbar spine resulting in multilevel severe central canal stenosis. Aortic Atherosclerosis (ICD10-I70.0). Electronically Signed   By: Helyn Numbers MD   On: 01/10/2021 04:52   DG Hip Unilat W or Wo Pelvis 2-3 Views Right  Result Date: 01/10/2021 CLINICAL DATA:  Fall, right hip pain EXAM: DG HIP (WITH OR WITHOUT PELVIS) 2-3V RIGHT COMPARISON:  06/10/2019 FINDINGS: Single view radiograph of the pelvis and two view radiograph of the right hip  demonstrate normal alignment. No acute fracture or dislocation. There is severe right hip degenerative arthritis again identified with near complete loss of the joint space, subchondral cyst formation, and remodeling of the acetabulum and femoral head. Limited evaluation of the left hip is unremarkable. Advanced vascular calcifications are noted within the pelvis and medial thighs bilaterally. IMPRESSION: Severe asymmetric right hip degenerative arthritis. No acute fracture or dislocation. Electronically Signed   By: Helyn Numbers MD   On: 01/10/2021 04:35    Procedures Procedures   Medications Ordered in ED Medications - No data to display  ED Course  I have reviewed the triage vital signs and the nursing notes.  Pertinent labs & imaging results that were available during my care of the patient were reviewed by me and considered in my medical decision making (see chart for details).    MDM Rules/Calculators/A&P                         Fall with head injury and right hip and back pain.  Vital stable.  No blood thinner use  No chest pain or shortness of breath.  No abdominal pain.  No preceding dizziness or lightheadedness.  No loss of consciousness  Traumatic imaging is negative.  CT C-spine shows severe degenerative changes with no acute fracture.  Also with severe degenerative changes of right hip but no fracture.  Patient states he has been told in the past he is not candidate for operative repair due to his age.  His pain is controlled.  He is able to ambulate with his walker which is his baseline.   He appears stable to return to his facility. Final Clinical Impression(s) / ED Diagnoses Final diagnoses:  Fall, initial encounter  Injury of head, initial encounter  Contusion of right hip, initial encounter    Rx / DC Orders ED Discharge Orders    None       Srihaan Mastrangelo, Jeannett Senior, MD 01/10/21 [phone]

## 2021-01-10 NOTE — Discharge Instructions (Signed)
Your scan shows severe arthritis in your neck and right hip but no fractures.  You should follow-up with your doctor.  Return to the ED if you develop new or worsening symptoms.

## 2021-01-10 NOTE — ED Notes (Signed)
[facility] called  

## 2021-01-10 NOTE — ED Notes (Signed)
Pt ambulated in hall with assistance of walker without complaints.

## 2021-01-10 NOTE — ED Notes (Signed)
Attempted to call report to [facility], no answer.

## 2021-01-13 ENCOUNTER — Telehealth: Payer: Self-pay | Admitting: Physical Medicine and Rehabilitation

## 2021-01-13 NOTE — Telephone Encounter (Signed)
Scheduled for 5/26 at 1300. 

## 2021-01-13 NOTE — Telephone Encounter (Signed)
Ok hip and shoulder but if needs eval after the fall he should see Dr. Ethelene Hal

## 2021-01-13 NOTE — Telephone Encounter (Signed)
Patient's son [name] called requesting a call back with medical advice for pt pains. Please call [name] at [phone].

## 2021-01-13 NOTE — Telephone Encounter (Signed)
Patient reports pain in hip, shoulder, and back. Right hip and right shoulder injection on 11/18/20. Right L5 TF on 03/10/20. Patient fell on 5/16 and was seen in the ED. Please advise.

## 2021-01-20 ENCOUNTER — Ambulatory Visit: Payer: Medicare Other | Admitting: Physical Medicine and Rehabilitation

## 2021-01-30 ENCOUNTER — Encounter (HOSPITAL_COMMUNITY): Payer: Self-pay

## 2021-01-30 ENCOUNTER — Encounter (HOSPITAL_COMMUNITY)
Admission: EM | Disposition: A | Discharge status: Skilled Nursing Facility | Payer: Self-pay | Source: Home / Self Care | Attending: Emergency Medicine

## 2021-01-30 ENCOUNTER — Emergency Department (HOSPITAL_COMMUNITY): Payer: Medicare Other | Admitting: Certified Registered Nurse Anesthetist

## 2021-01-30 ENCOUNTER — Other Ambulatory Visit: Payer: Self-pay

## 2021-01-30 ENCOUNTER — Emergency Department (HOSPITAL_COMMUNITY): Payer: Medicare Other

## 2021-01-30 ENCOUNTER — Ambulatory Visit (HOSPITAL_COMMUNITY)
Admission: EM | Admit: 2021-01-30 | Discharge: 2021-01-30 | Disposition: A | Discharge status: Skilled Nursing Facility | Payer: Medicare Other | Attending: Emergency Medicine | Admitting: Emergency Medicine

## 2021-01-30 DIAGNOSIS — R1319 Other dysphagia: Secondary | ICD-10-CM

## 2021-01-30 DIAGNOSIS — Z7982 Long term (current) use of aspirin: Secondary | ICD-10-CM | POA: Insufficient documentation

## 2021-01-30 DIAGNOSIS — E7800 Pure hypercholesterolemia, unspecified: Secondary | ICD-10-CM | POA: Insufficient documentation

## 2021-01-30 DIAGNOSIS — E119 Type 2 diabetes mellitus without complications: Secondary | ICD-10-CM | POA: Diagnosis not present

## 2021-01-30 DIAGNOSIS — K269 Duodenal ulcer, unspecified as acute or chronic, without hemorrhage or perforation: Secondary | ICD-10-CM

## 2021-01-30 DIAGNOSIS — Z79890 Hormone replacement therapy (postmenopausal): Secondary | ICD-10-CM | POA: Diagnosis not present

## 2021-01-30 DIAGNOSIS — K222 Esophageal obstruction: Secondary | ICD-10-CM | POA: Insufficient documentation

## 2021-01-30 DIAGNOSIS — Z888 Allergy status to other drugs, medicaments and biological substances status: Secondary | ICD-10-CM | POA: Diagnosis not present

## 2021-01-30 DIAGNOSIS — R1310 Dysphagia, unspecified: Secondary | ICD-10-CM

## 2021-01-30 DIAGNOSIS — Z20822 Contact with and (suspected) exposure to covid-19: Secondary | ICD-10-CM | POA: Insufficient documentation

## 2021-01-30 DIAGNOSIS — Z79899 Other long term (current) drug therapy: Secondary | ICD-10-CM | POA: Insufficient documentation

## 2021-01-30 DIAGNOSIS — E039 Hypothyroidism, unspecified: Secondary | ICD-10-CM | POA: Diagnosis not present

## 2021-01-30 DIAGNOSIS — T18128A Food in esophagus causing other injury, initial encounter: Secondary | ICD-10-CM | POA: Diagnosis present

## 2021-01-30 DIAGNOSIS — K449 Diaphragmatic hernia without obstruction or gangrene: Secondary | ICD-10-CM | POA: Insufficient documentation

## 2021-01-30 DIAGNOSIS — I10 Essential (primary) hypertension: Secondary | ICD-10-CM | POA: Diagnosis not present

## 2021-01-30 HISTORY — PX: ESOPHAGOGASTRODUODENOSCOPY (EGD) WITH PROPOFOL: SHX5813

## 2021-01-30 HISTORY — PX: FOREIGN BODY REMOVAL: SHX962

## 2021-01-30 HISTORY — PX: BALLOON DILATION: SHX5330

## 2021-01-30 HISTORY — PX: BIOPSY: SHX5522

## 2021-01-30 LAB — COMPREHENSIVE METABOLIC PANEL
ALT: 25 U/L (ref 0–44)
AST: 28 U/L (ref 15–41)
Albumin: 3.7 g/dL (ref 3.5–5.0)
Alkaline Phosphatase: 57 U/L (ref 38–126)
Anion gap: 8 (ref 5–15)
BUN: 30 mg/dL — ABNORMAL HIGH (ref 8–23)
CO2: 27 mmol/L (ref 22–32)
Calcium: 9.2 mg/dL (ref 8.9–10.3)
Chloride: 105 mmol/L (ref 98–111)
Creatinine, Ser: 1.29 mg/dL — ABNORMAL HIGH (ref 0.61–1.24)
GFR, Estimated: 51 mL/min — ABNORMAL LOW (ref >60)
Glucose, Bld: 99 mg/dL (ref 70–99)
Potassium: 4.8 mmol/L (ref 3.5–5.1)
Sodium: 140 mmol/L (ref 135–145)
Total Bilirubin: 0.8 mg/dL (ref 0.3–1.2)
Total Protein: 6.3 g/dL — ABNORMAL LOW (ref 6.5–8.1)

## 2021-01-30 LAB — CBC WITH DIFFERENTIAL/PLATELET
Abs Immature Granulocytes: 0.02 10*3/uL (ref 0.00–0.07)
Basophils Absolute: 0 10*3/uL (ref 0.0–0.1)
Basophils Relative: 1 %
Eosinophils Absolute: 0.2 10*3/uL (ref 0.0–0.5)
Eosinophils Relative: 3 %
HCT: 30.5 % — ABNORMAL LOW (ref 39.0–52.0)
Hemoglobin: 10.1 g/dL — ABNORMAL LOW (ref 13.0–17.0)
Immature Granulocytes: 0 %
Lymphocytes Relative: 24 %
Lymphs Abs: 1.3 10*3/uL (ref 0.7–4.0)
MCH: 34.9 pg — ABNORMAL HIGH (ref 26.0–34.0)
MCHC: 33.1 g/dL (ref 30.0–36.0)
MCV: 105.5 fL — ABNORMAL HIGH (ref 80.0–100.0)
Monocytes Absolute: 0.5 10*3/uL (ref 0.1–1.0)
Monocytes Relative: 9 %
Neutro Abs: 3.5 10*3/uL (ref 1.7–7.7)
Neutrophils Relative %: 63 %
Platelets: 175 10*3/uL (ref 150–400)
RBC: 2.89 MIL/uL — ABNORMAL LOW (ref 4.22–5.81)
RDW: 11.7 % (ref 11.5–15.5)
WBC: 5.5 10*3/uL (ref 4.0–10.5)
nRBC: 0 % (ref 0.0–0.2)

## 2021-01-30 LAB — RESP PANEL BY RT-PCR (FLU A&B, COVID) ARPGX2
Influenza A by PCR: NEGATIVE
Influenza B by PCR: NEGATIVE
SARS Coronavirus 2 by RT PCR: NEGATIVE

## 2021-01-30 SURGERY — ESOPHAGOGASTRODUODENOSCOPY (EGD) WITH PROPOFOL
Anesthesia: Monitor Anesthesia Care

## 2021-01-30 MED ORDER — OMEPRAZOLE 20 MG PO CPDR: 20.0000 mg | DELAYED_RELEASE_CAPSULE | Freq: Every day | ORAL | 1 refills | Status: DC

## 2021-01-30 MED ORDER — PROPOFOL 10 MG/ML IV BOLUS
INTRAVENOUS | Status: DC | PRN
  Administered 2021-01-30 (×2): 10 mg via INTRAVENOUS
  Administered 2021-01-30: 20 mg via INTRAVENOUS

## 2021-01-30 MED ORDER — LACTATED RINGERS IV BOLUS
500.0000 mL | Freq: Once | INTRAVENOUS | Status: AC
  Administered 2021-01-30: 500 mL via INTRAVENOUS

## 2021-01-30 MED ORDER — LACTATED RINGERS IV SOLN: INTRAVENOUS | Status: AC

## 2021-01-30 MED ORDER — LIDOCAINE 2% (20 MG/ML) 5 ML SYRINGE
INTRAMUSCULAR | Status: DC | PRN
  Administered 2021-01-30: 80 mg via INTRAVENOUS

## 2021-01-30 MED ORDER — SODIUM CHLORIDE 0.9 % IV SOLN: INTRAVENOUS | Status: DC

## 2021-01-30 MED ORDER — PROPOFOL 500 MG/50ML IV EMUL
INTRAVENOUS | Status: DC | PRN
  Administered 2021-01-30: 60 ug/kg/min via INTRAVENOUS

## 2021-01-30 MED ORDER — ONDANSETRON HCL 4 MG/2ML IJ SOLN
INTRAMUSCULAR | Status: DC | PRN
  Administered 2021-01-30: 4 mg via INTRAVENOUS

## 2021-01-30 MED ORDER — METOPROLOL TARTRATE 5 MG/5ML IV SOLN
INTRAVENOUS | Status: DC | PRN
  Administered 2021-01-30: 2 mg via INTRAVENOUS

## 2021-01-30 SURGICAL SUPPLY — 15 items

## 2021-01-30 NOTE — ED Notes (Signed)
Called [facility] unable to give time of arrival

## 2021-01-30 NOTE — ED Triage Notes (Signed)
Pt arrived to ED via EMS from [facility] w/ c/o difficulty swallowing x 1 week that has progressively gotten worse. Pt reports worse in the morning. Pt also reports he about choked today while eating breakfast. Pt has productive cough d/t not being able to swallow per EMS. Pt denies feeling like anything is in his throat. Reports that what he tries to swallow, comes back up. VSS.

## 2021-01-30 NOTE — ED Notes (Signed)
Pt is awaiting transport at this time.

## 2021-01-30 NOTE — Anesthesia Procedure Notes (Signed)
Procedure Name: MAC Date/Time: 01/30/2021 1:26 PM Performed by: Janace Litten, CRNA Pre-anesthesia Checklist: Patient identified, Emergency Drugs available, Suction available and Patient being monitored Patient Re-evaluated:Patient Re-evaluated prior to induction Oxygen Delivery Method: Nasal cannula

## 2021-01-30 NOTE — Consult Note (Addendum)
Referring Provider: Dr. Rhunette CroftNanavati, EDP Primary Care Physician:  Chilton GreathouseAvva, [name], MD Primary Gastroenterologist:  Gentry FitzUnassigned  Reason for Consultation:  Dysphagia, food impaction  HPI: Ricky Sandoval is a 87 y.o. male with limited past medical history as listed below.  [facility] from his living facility with complaints of being unable to swallow.  He tells me that for the past couple of weeks he has noticed that he has had issues swallowing, usually just in the morning.  He says that this morning it was particularly bad.  He says that he had a great breakfast in front of him, but he took 1 bite and the food came back up and he has been unable to swallow liquids since then as well.  He recalls eating dinner last night and does not think that he had any problems with food getting stuck at that point.  He says that it mostly just occurs in the mornings for some reason.  He has never had an EGD in the past.  Has never had any similar symptoms in the past.  He denies weight loss, in fact, he says that he has gained 16 pounds recently.   Past Medical History:  Diagnosis Date  . Diabetes type 2, controlled (HCC)   . High cholesterol   . Hypertension   . Hypothyroid     History reviewed. No pertinent surgical history.  Prior to Admission medications   Medication Sig Start Date End Date Taking? Authorizing Provider  acetaminophen (TYLENOL) 325 MG tablet Take 650 mg by mouth 2 (two) times daily as needed for mild pain.    [provider]  acetaminophen (TYLENOL) 500 MG tablet Take 1,000 mg by mouth at bedtime.    [provider]  amLODipine (NORVASC) 2.5 MG tablet Take 2.5 mg by mouth daily.    [provider]  aspirin 81 MG tablet Take 81 mg by mouth daily.    [provider]  Carboxymethylcellulose Sodium (REFRESH TEARS OP) Place 2 drops into both eyes 2 (two) times daily as needed (dry eyes).    [provider]   Cholecalciferol (VITAMIN D3) 50 MCG (2000 UT) TABS Take 2,000 Units by mouth daily.    [provider]  docusate sodium (COLACE) 100 MG capsule Take 300 mg by mouth daily.    [provider]  ferrous sulfate 325 (65 FE) MG tablet Take 325 mg by mouth daily with breakfast. Patient not taking: Reported on 01/10/2021    [provider]  flunisolide (NASALIDE) 25 MCG/ACT (0.025%) SOLN Place 2 sprays into the nose daily.    [provider]  HYDROcodone-acetaminophen (NORCO/VICODIN) 5-325 MG tablet Take 1 tablet by mouth every 6 (six) hours as needed for severe pain. Patient not taking: Reported on 01/10/2021 09/25/20   Gailen ShelterFondaw, Wylder S, PA  ketotifen (KP KETOTIFEN FUMARATE) 0.025 % ophthalmic solution Place 1 drop into both eyes 3 (three) times daily.    [provider]  levothyroxine (SYNTHROID, LEVOTHROID) 75 MCG tablet Take 75 mcg by mouth daily before breakfast.    [provider]  loratadine (CLARITIN) 10 MG tablet Take 10 mg by mouth daily.    [provider]  metoprolol succinate (TOPROL-XL) 25 MG 24 hr tablet Take 25 mg by mouth daily.    [provider]  Multiple Vitamins-Minerals (CENTRUM SILVER PO) Take 1 tablet by mouth daily.    [provider]  Multiple Vitamins-Minerals (PRESERVISION AREDS PO) Take 2  capsules by mouth daily.    [provider]  nortriptyline (PAMELOR) 25 MG capsule Take 25 mg by mouth at bedtime.    [provider]  omeprazole (PRILOSEC) 20 MG capsule Take 20 mg by mouth daily.    [provider]  simvastatin (ZOCOR) 40 MG tablet Take 20 mg by mouth at bedtime.    [provider]  sulfamethoxazole-trimethoprim (BACTRIM DS) 800-160 MG tablet Take 1 tablet by mouth See admin instructions. Bid x 7 days    [provider]  vitamin C (ASCORBIC ACID) 500 MG tablet Take 500 mg by mouth daily.    [provider]    Current Facility-Administered  Medications  Medication Dose Route Frequency Provider Last Rate Last Admin  . lactated ringers bolus 500 mL  500 mL Intravenous Once Derwood Kaplan, MD      . lactated ringers infusion   Intravenous Continuous Derwood Kaplan, MD 125 mL/hr at 01/30/21 1134 New Bag at 01/30/21 1134   Current Outpatient Medications  Medication Sig Dispense Refill  . acetaminophen (TYLENOL) 325 MG tablet Take 650 mg by mouth 2 (two) times daily as needed for mild pain.    Marland Kitchen acetaminophen (TYLENOL) 500 MG tablet Take 1,000 mg by mouth at bedtime.    Marland Kitchen amLODipine (NORVASC) 2.5 MG tablet Take 2.5 mg by mouth daily.    Marland Kitchen aspirin 81 MG tablet Take 81 mg by mouth daily.    . Carboxymethylcellulose Sodium (REFRESH TEARS OP) Place 2 drops into both eyes 2 (two) times daily as needed (dry eyes).    . Cholecalciferol (VITAMIN D3) 50 MCG (2000 UT) TABS Take 2,000 Units by mouth daily.    Marland Kitchen docusate sodium (COLACE) 100 MG capsule Take 300 mg by mouth daily.    . ferrous sulfate 325 (65 FE) MG tablet Take 325 mg by mouth daily with breakfast. (Patient not taking: Reported on 01/10/2021)    . flunisolide (NASALIDE) 25 MCG/ACT (0.025%) SOLN Place 2 sprays into the nose daily.    Marland Kitchen HYDROcodone-acetaminophen (NORCO/VICODIN) 5-325 MG tablet Take 1 tablet by mouth every 6 (six) hours as needed for severe pain. (Patient not taking: Reported on 01/10/2021) 8 tablet 0  . ketotifen (KP KETOTIFEN FUMARATE) 0.025 % ophthalmic solution Place 1 drop into both eyes 3 (three) times daily.    Marland Kitchen levothyroxine (SYNTHROID, LEVOTHROID) 75 MCG tablet Take 75 mcg by mouth daily before breakfast.    . loratadine (CLARITIN) 10 MG tablet Take 10 mg by mouth daily.    . metoprolol succinate (TOPROL-XL) 25 MG 24 hr tablet Take 25 mg by mouth daily.    . Multiple Vitamins-Minerals (CENTRUM SILVER PO) Take 1 tablet by mouth daily.    . Multiple Vitamins-Minerals (PRESERVISION AREDS PO) Take 2 capsules by mouth daily.    . nortriptyline (PAMELOR) 25 MG  capsule Take 25 mg by mouth at bedtime.    Marland Kitchen omeprazole (PRILOSEC) 20 MG capsule Take 20 mg by mouth daily.    . simvastatin (ZOCOR) 40 MG tablet Take 20 mg by mouth at bedtime.    . sulfamethoxazole-trimethoprim (BACTRIM DS) 800-160 MG tablet Take 1 tablet by mouth See admin instructions. Bid x 7 days    . vitamin C (ASCORBIC ACID) 500 MG tablet Take 500 mg by mouth daily.      Allergies as of 01/30/2021 - Review Complete 01/30/2021  Allergen Reaction Noted  . Lisinopril  11/23/2006    History reviewed. No pertinent family history.  Social History  Socioeconomic History  . Marital status: Widowed    Spouse name: Not on file  . Number of children: Not on file  . Years of education: Not on file  . Highest education level: Not on file  Occupational History  . Not on file  Tobacco Use  . Smoking status: Unknown If Ever Smoked  . Smokeless tobacco: Not on file  Substance and Sexual Activity  . Alcohol use: Not Currently  . Drug use: Never  . Sexual activity: Not on file  Other Topics Concern  . Not on file  Social History Narrative  . Not on file   Social Determinants of Health   Financial Resource Strain: Not on file  Food Insecurity: Not on file  Transportation Needs: Not on file  Physical Activity: Not on file  Stress: Not on file  Social Connections: Not on file  Intimate Partner Violence: Not on file   Review of Systems: ROS is O/W negative except as mentioned in HPI.  Physical Exam: Vital signs in last 24 hours: Pulse Rate:  [63-68] 68 (06/05 1100) Resp:  [14-20] 20 (06/05 1100) BP: (147-165)/(64-83) 165/64 (06/05 1100) SpO2:  [93 %-98 %] 93 % (06/05 1100)   General:  Alert, Well-developed, well-nourished, pleasant and cooperative in NAD Head:  Normocephalic and atraumatic. Eyes:  Sclera clear, no icterus.  Conjunctiva pink. Ears:  Normal auditory acuity. Mouth:  No deformity or lesions.   Lungs:  Clear throughout to auscultation.  No wheezes, crackles,  or rhonchi.  Heart:  Regular rate and rhythm; no murmurs, clicks, rubs, or gallops. Abdomen:  Soft, non-distended.  BS present.  Non-tender.  Msk:  Symmetrical without gross deformities. Pulses:  Normal pulses noted. Extremities:  Without clubbing or edema. Neurologic:  Alert and oriented x 4;  grossly normal neurologically. Skin:  Intact without significant lesions or rashes. Psych:  Alert and cooperative. Normal mood and affect.  Intake/Output this shift: Total I/O In: -  Out: 200 [Urine:200]  Lab Results: Recent Labs    01/30/21 0949  WBC 5.5  HGB 10.1*  HCT 30.5*  PLT 175   BMET Recent Labs    01/30/21 0949  NA 140  K 4.8  CL 105  CO2 27  GLUCOSE 99  BUN 30*  CREATININE 1.29*  CALCIUM 9.2   LFT Recent Labs    01/30/21 0949  PROT 6.3*  ALBUMIN 3.7  AST 28  ALT 25  ALKPHOS 57  BILITOT 0.8   Studies/Results: DG Chest Portable 1 View  Result Date: 01/30/2021 CLINICAL DATA:  Dysphagia.  Cough EXAM: PORTABLE CHEST 1 VIEW COMPARISON:  Jan 10, 2021 FINDINGS: No edema or airspace opacity. Heart is upper normal in size with pulmonary vascularity normal. No adenopathy. There is aortic atherosclerosis. There is degenerative change in each shoulder. IMPRESSION: No edema or airspace opacity. Heart upper normal in size. Aortic Atherosclerosis (ICD10-I70.0). Electronically Signed   By: Bretta Bang III M.D.   On: 01/30/2021 09:57   IMPRESSION:  *87 year old male with 2-week history of difficulty swallowing, particularly in the mornings.  Today was much worse and with 1 bite of breakfast the food came back up and he has been unable to swallow liquids since then as well.  He recalls eating dinner last night and does not believe that he got any food stuck at that point.  Has never had any issues similar to this in the past.  Has never had EGD.  No weight loss as he reports that he has actually  gained about 16 pounds.  PLAN: *We will plan for EGD with Dr. Marina Goodell later  today for removal of possible food impaction versus dilation, etc.  Princella Pellegrini. Zehr  01/30/2021, 11:37 AM  GI ATTENDING  History, laboratories, x-rays reviewed.  Patient personally seen and examined.  Agree with comprehensive consultation note as outlined above.  Unassigned patient presents to the emergency room with acute dysphagia.  He reports swallowing difficulties over the past week or so.  He denies having had previous swallowing issues prior.  Denies heartburn.  Had issues yesterday.  Difficulty with breakfast today.  Cannot keep water down without regurgitation.  Now for urgent upper endoscopy.The nature of the procedure, as well as the risks, benefits, and alternatives were carefully and thoroughly reviewed with the patient. Ample time for discussion and questions allowed. The patient understood, was satisfied, and agreed to proceed.  Wilhemina Bonito. Eda Keys., M.D. [facility] Division of Gastroenterology

## 2021-01-30 NOTE — ED Provider Notes (Addendum)
[facility] EMERGENCY DEPARTMENT Provider Note   CSN: [id number] Arrival date & time: 01/30/21  7371     History Chief Complaint  Patient presents with  . Dysphagia    Ricky Sandoval is a 87 y.o. male.  HPI    87 year old male comes in a chief complaint of difficulty swallowing.  Patient has history of diabetes, hypertension and cholesterol.  No history of TIA or stroke.  He reports that over the last 2 weeks he has been having difficulty in swallowing.  Every time he tries to eat or drink something in the morning, it seems to him that the food gets stuck in his throat.  Today the symptoms were more severe, he started choking, gagging and almost passed out.  When patient drinks water, he spits that out tube.  Review of system is negative for any focal numbness, tingling, weakness, vision change.  No new medications  Past Medical History:  Diagnosis Date  . Diabetes type 2, controlled (HCC)   . High cholesterol   . Hypertension   . Hypothyroid     Patient Active Problem List   Diagnosis Date Noted  . Food impaction of esophagus   . Duodenal erosion   . Esophageal stricture     History reviewed. No pertinent surgical history.     History reviewed. No pertinent family history.  Social History   Tobacco Use  . Smoking status: Unknown If Ever Smoked  Substance Use Topics  . Alcohol use: Not Currently  . Drug use: Never    Home Medications Prior to Admission medications   Medication Sig Start Date End Date Taking? Authorizing Provider  acetaminophen (TYLENOL) 325 MG tablet Take 650 mg by mouth 2 (two) times daily as needed for mild pain.   Yes [provider]  acetaminophen (TYLENOL) 500 MG tablet Take 1,000 mg by mouth at bedtime.   Yes [provider]  amLODipine (NORVASC) 2.5 MG tablet Take 2.5 mg by mouth daily.   Yes [provider]  aspirin 81 MG tablet Take 81 mg by mouth daily.   Yes [provider]   Boswellia-Glucosamine-Vit D (OSTEO BI-FLEX ONE PER DAY PO) Take 1 tablet by mouth daily. 1500MG -400MG    Yes [provider]  Carboxymethylcellulose Sodium (REFRESH TEARS OP) Place 2 drops into both eyes 2 (two) times daily as needed (dry eyes).   Yes [provider]  Cholecalciferol (VITAMIN D3) 50 MCG (2000 UT) TABS Take 2,000 Units by mouth daily.   Yes [provider]  docusate sodium (COLACE) 100 MG capsule Take 300 mg by mouth daily.   Yes [provider]  flunisolide (NASALIDE) 25 MCG/ACT (0.025%) SOLN Place 2 sprays into the nose daily.   Yes [provider]  ketotifen (ZADITOR) 0.025 % ophthalmic solution Place 1 drop into both eyes 3 (three) times daily.   Yes [provider]  levothyroxine (SYNTHROID, LEVOTHROID) 75 MCG tablet Take 75 mcg by mouth daily before breakfast.   Yes [provider]  loratadine (CLARITIN) 10 MG tablet Take 10 mg by mouth daily.   Yes [provider]  Menthol, Topical Analgesic, (ICY HOT MEDICATED SPRAY EX) Apply 1 application topically in the morning, at noon, in the evening, and at bedtime.   Yes [provider]  metoprolol succinate (TOPROL-XL) 25 MG 24 hr tablet Take 25 mg by mouth daily.   Yes [provider]  Multiple Vitamins-Minerals (CENTRUM SILVER PO) Take 1 tablet by mouth daily.  Yes [provider]  Multiple Vitamins-Minerals (PRESERVISION AREDS PO) Take 2 capsules by mouth daily.   Yes [provider]  nortriptyline (PAMELOR) 25 MG capsule Take 25 mg by mouth at bedtime.   Yes [provider]  simvastatin (ZOCOR) 40 MG tablet Take 20 mg by mouth at bedtime.   Yes [provider]  vitamin C (ASCORBIC ACID) 500 MG tablet Take 500 mg by mouth daily.   Yes [provider]  ferrous sulfate 325 (65 FE) MG tablet Take 325 mg by mouth daily with breakfast. Patient not taking: No sig reported    [provider]   HYDROcodone-acetaminophen (NORCO/VICODIN) 5-325 MG tablet Take 1 tablet by mouth every 6 (six) hours as needed for severe pain. Patient not taking: No sig reported 09/25/20   Gailen Shelter, PA  omeprazole (PRILOSEC) 20 MG capsule Take 1 capsule (20 mg total) by mouth daily. 01/30/21   Derwood Kaplan, MD    Allergies    Lisinopril  Review of Systems   Review of Systems  Constitutional: Positive for activity change.  HENT: Positive for trouble swallowing. Negative for drooling and voice change.   Respiratory: Negative for shortness of breath.   Cardiovascular: Negative for chest pain.  Gastrointestinal: Negative for nausea and vomiting.  All other systems reviewed and are negative.   Physical Exam Updated Vital Signs BP (!) 177/99   Pulse 63   Temp 98.5 F (36.9 C) (Oral)   Resp (!) 25   Ht 5\' 5"  (1.651 m)   Wt 72.6 kg   SpO2 97%   BMI 26.63 kg/m   Physical Exam Vitals and nursing note reviewed.  Constitutional:      Appearance: He is well-developed.  HENT:     Head: Atraumatic.  Cardiovascular:     Rate and Rhythm: Normal rate.  Pulmonary:     Effort: Pulmonary effort is normal.  Musculoskeletal:     Cervical back: Neck supple.  Skin:    General: Skin is warm.  Neurological:     Mental Status: He is alert and oriented to person, place, and time.     Cranial Nerves: No cranial nerve deficit.     Sensory: No sensory deficit.     Motor: No weakness.     Coordination: Coordination normal.     ED Results / Procedures / Treatments   Labs (all labs ordered are listed, but only abnormal results are displayed) Labs Reviewed  COMPREHENSIVE METABOLIC PANEL - Abnormal; Notable for the following components:      Result Value   BUN 30 (*)    Creatinine, Ser 1.29 (*)    Total Protein 6.3 (*)    GFR, Estimated 51 (*)    All other components within normal limits  CBC WITH DIFFERENTIAL/PLATELET - Abnormal; Notable for the following components:   RBC 2.89 (*)     Hemoglobin 10.1 (*)    HCT 30.5 (*)    MCV 105.5 (*)    MCH 34.9 (*)    All other components within normal limits  RESP PANEL BY RT-PCR (FLU A&B, COVID) [id number]    EKG None  Radiology DG Chest Portable 1 View  Result Date: 01/30/2021 CLINICAL DATA:  Dysphagia.  Cough EXAM: PORTABLE CHEST 1 VIEW COMPARISON:  Jan 10, 2021 FINDINGS: No edema or airspace opacity. Heart is upper normal in size with pulmonary vascularity normal. No adenopathy. There is aortic atherosclerosis. There is degenerative change in each shoulder. IMPRESSION: No edema or airspace opacity.  Heart upper normal in size. Aortic Atherosclerosis (ICD10-I70.0). Electronically Signed   By: Bretta Bang III M.D.   On: 01/30/2021 09:57    Procedures Procedures   Medications Ordered in ED Medications  lactated ringers infusion ( Intravenous Anesthesia Volume Adjustment 01/30/21 1346)  lactated ringers bolus 500 mL (500 mLs Intravenous New Bag/Given 01/30/21 1137)    ED Course  I have reviewed the triage vital signs and the nursing notes.  Pertinent labs & imaging results that were available during my care of the patient were reviewed by me and considered in my medical decision making (see chart for details).  Clinical Course as of 01/30/21 1502  Sun Jan 30, 2021  1502 GI services discharge the patient.  Patient had food impaction that was relieved.  He is stable for discharge, is back to baseline. [AN]    Clinical Course User Index [AN] Derwood Kaplan, MD   MDM Rules/Calculators/A&P                          87 year old comes with a chief complaint of difficulty swallowing.  He is also having some gagging, symptoms have progressed over the last 2 weeks.  Clinically does not appear that patient has had a stroke.  Neuro exam is nonfocal besides the difficulty swallowing, that likely is secondary to food impaction or motility problems that are localized to the esophagus.  I will consult GI.  Final Clinical  Impression(s) / ED Diagnoses Final diagnoses:  Dysphagia, unspecified type    Rx / DC Orders ED Discharge Orders         Ordered    omeprazole (PRILOSEC) 20 MG capsule  Daily        01/30/21 1459           Derwood Kaplan, MD 01/30/21 1258    Derwood Kaplan, MD 01/30/21 1502

## 2021-01-30 NOTE — Discharge Instructions (Addendum)
Ricky Sandoval was seen in the ER for choking incident.  He had food stuck in his esophagus that was removed.  Please start him on Prilosec as prescribed. The GI doctor recommended that he get soft food/diet tonight and tomorrow, then he can advance to normal diet.  Ricky Sandoval did get procedural sedation for the endoscopy.  Please ensure that he does not have any fall or trauma as he still might be under the effects of anesthesia.

## 2021-01-30 NOTE — ED Notes (Signed)
Pt states he believes procedure went well and drank water afterwards and reports that it went "right down" and feels improvement from procedure.

## 2021-01-30 NOTE — Anesthesia Postprocedure Evaluation (Signed)
Anesthesia Post Note  Patient: Ricky Sandoval  Procedure(s) Performed: ESOPHAGOGASTRODUODENOSCOPY (EGD) WITH PROPOFOL (N/A ) FOREIGN BODY REMOVAL BALLOON DILATION (N/A ) BIOPSY     Patient location during evaluation: Endoscopy Anesthesia Type: MAC Level of consciousness: awake and alert Pain management: pain level controlled Vital Signs Assessment: post-procedure vital signs reviewed and stable Respiratory status: spontaneous breathing, nonlabored ventilation, respiratory function stable and patient connected to nasal cannula oxygen Cardiovascular status: stable and blood pressure returned to baseline Postop Assessment: no apparent nausea or vomiting Anesthetic complications: no   No complications documented.  Last Vitals:  Vitals:   01/30/21 1425 01/30/21 1910  BP: (!) 177/99 (!) 154/66  Pulse: 63 76  Resp: (!) 25 18  Temp:  36.8 C  SpO2: 97% 93%    Last Pain:  Vitals:   01/30/21 1910  TempSrc: Oral  PainSc:                  March Rummage Londan Coplen

## 2021-01-30 NOTE — Op Note (Signed)
[facility] Patient Name: Quatavious Rossa Procedure Date : 01/30/2021 MRN: [id number] Attending MD: Wilhemina Bonito. Marina Goodell , MD Date of Birth: 1922-05-13 CSN: [id number] Age: 87 Admit Type: Outpatient Procedure:                Upper GI endoscopy with removal of food impaction;                            dilation; biopsies Indications:              Dysphagia Providers:                Wilhemina Bonito. Marina Goodell, MD, Margaree Mackintosh, RN, Arlee Muslim Tech., Technician, Scherrie Merritts, CRNA Referring MD:             Dr. Rhunette Croft Medicines:                Monitored Anesthesia Care Complications:            No immediate complications. Estimated Blood Loss:     Estimated blood loss: none. Procedure:                Pre-Anesthesia Assessment:                           - Prior to the procedure, a History and Physical                            was performed, and patient medications and                            allergies were reviewed. The patient's tolerance of                            previous anesthesia was also reviewed. The risks                            and benefits of the procedure and the sedation                            options and risks were discussed with the patient.                            All questions were answered, and informed consent                            was obtained. Prior Anticoagulants: The patient has                            taken no previous anticoagulant or antiplatelet                            agents. ASA Grade Assessment: II - A patient with  mild systemic disease. After reviewing the risks                            and benefits, the patient was deemed in                            satisfactory condition to undergo the procedure.                           After obtaining informed consent, the endoscope was                            passed under direct vision. Throughout the                             procedure, the patient's blood pressure, pulse, and                            oxygen saturations were monitored continuously. The                            GIF-H190 ([id number] Olympus gastroscope was                            introduced through the mouth, and advanced to the                            second part of duodenum. The upper GI endoscopy was                            accomplished without difficulty. The patient                            tolerated the procedure well. Scope In: Scope Out: Findings:      The esophagus was impacted with large volume soft food material. This       was advanced into the stomach without difficulty. Esophagus was somewhat       tortuous.      One benign-appearing, intrinsic moderate ringlike stenosis was found 35       cm from the incisors. This stenosis measured 1.4 cm (inner diameter).       The stenosis was traversed. After completing the endoscopic survey, A       TTS dilator was passed through the scope. Dilation with an 18-19-20 mm       balloon dilator was performed to 20 mm.      The stomach was normal, save moderate hiatal hernia.      The examined duodenum revealed several superficial erosions. Biopsies       were taken with a cold forceps for CLO testing from the gastric antrum.      The cardia and gastric fundus were normal on retroflexion. Impression:               1. Esophageal food impaction status post successful  endoscopic removal                           2. Underlying distal esophageal ring and hiatal                            hernia                           3. Duodenal erosions status post CLO biopsy. Recommendation:           1. Patient has a contact number available for                            emergencies. The signs and symptoms of potential                            delayed complications were discussed with the                            patient. Return to normal activities tomorrow.                             Written discharge instructions were provided to the                            patient.                           2. Clear liquids for 2 hours then soft foods till                            a.m. Resume previous diet thereafter.                           3. Continue present medications.                           4. Await pathology results.                           5. Recommend you pick up over-the-counter Prilosec                            (omeprazole) 20 mg daily. This will protect against                            esophageal inflammation, esophageal narrowing, and                            ulcers. Procedure Code(s):        --- Professional ---                           [id number], Esophagogastroduodenoscopy, flexible,  transoral; with transendoscopic balloon dilation of                            esophagus (less than 30 mm diameter)                           43239, 59, Esophagogastroduodenoscopy, flexible,                            transoral; with biopsy, single or multiple Diagnosis Code(s):        --- Professional ---                           K22.2, Esophageal obstruction                           R13.10, Dysphagia, unspecified CPT copyright 2019 American Medical Association. All rights reserved. The codes documented in this report are preliminary and upon coder review may  be revised to meet current compliance requirements. Wilhemina Bonito. Marina Goodell, MD 01/30/2021 1:59:25 PM This report has been signed electronically. Number of Addenda: 0

## 2021-01-30 NOTE — Anesthesia Preprocedure Evaluation (Signed)
Anesthesia Evaluation  Patient identified by MRN, date of birth, ID band Patient awake    Reviewed: Allergy & Precautions, NPO status , Patient's Chart, lab work & pertinent test results  Airway Mallampati: II  TM Distance: >3 FB Neck ROM: Full    Dental  (+) Teeth Intact   Pulmonary neg pulmonary ROS,    Pulmonary exam normal        Cardiovascular hypertension, Pt. on medications and Pt. on home beta blockers  Rhythm:Regular Rate:Normal     Neuro/Psych negative neurological ROS  negative psych ROS   GI/Hepatic Neg liver ROS, GERD  Medicated,Food impaction   Endo/Other  diabetes, Well ControlledHypothyroidism   Renal/GU negative Renal ROS  negative genitourinary   Musculoskeletal negative musculoskeletal ROS (+)   Abdominal (+)  Abdomen: soft. Bowel sounds: normal.  Peds  Hematology negative hematology ROS (+)   Anesthesia Other Findings   Reproductive/Obstetrics                             Anesthesia Physical Anesthesia Plan  ASA: II  Anesthesia Plan: MAC   Post-op Pain Management:    Induction: Intravenous  PONV Risk Score and Plan: 1 and Ondansetron, Treatment may vary due to age or medical condition and Propofol infusion  Airway Management Planned: Simple Face Mask, Natural Airway and Nasal Cannula  Additional Equipment: None  Intra-op Plan:   Post-operative Plan:   Informed Consent: I have reviewed the patients History and Physical, chart, labs and discussed the procedure including the risks, benefits and alternatives for the proposed anesthesia with the patient or authorized representative who has indicated his/her understanding and acceptance.     Dental advisory given  Plan Discussed with: CRNA  Anesthesia Plan Comments: (Lab Results      Component                Value               Date                      WBC                      5.5                  01/30/2021                HGB                      10.1 (L)            01/30/2021                HCT                      30.5 (L)            01/30/2021                MCV                      105.5 (H)           01/30/2021                PLT                      175  01/30/2021           Lab Results      Component                Value               Date                      NA                       140                 01/30/2021                K                        4.8                 01/30/2021                CO2                      27                  01/30/2021                GLUCOSE                  99                  01/30/2021                BUN                      30 (H)              01/30/2021                CREATININE               1.29 (H)            01/30/2021                CALCIUM                  9.2                 01/30/2021                GFRNONAA                 51 (L)              01/30/2021                GFRAA                    57 (L)              06/10/2019          )        Anesthesia Quick Evaluation

## 2021-01-30 NOTE — Transfer of Care (Signed)
Immediate Anesthesia Transfer of Care Note  Patient: Ricky Sandoval  Procedure(s) Performed: ESOPHAGOGASTRODUODENOSCOPY (EGD) WITH PROPOFOL (N/A ) FOREIGN BODY REMOVAL BALLOON DILATION (N/A ) BIOPSY  Patient Location: PACU  Anesthesia Type:MAC  Level of Consciousness: drowsy, patient cooperative and responds to stimulation  Airway & Oxygen Therapy: Patient Spontanous Breathing  Post-op Assessment: Report given to RN and Post -op Vital signs reviewed and stable  Post vital signs: Reviewed and stable  Last Vitals:  Vitals Value Taken Time  BP 145/51 01/30/21 1355  Temp    Pulse 63 01/30/21 1355  Resp 21 01/30/21 1355  SpO2 96 % 01/30/21 1355    Last Pain:  Vitals:   01/30/21 1355  TempSrc:   PainSc: 0-No pain         Complications: No complications documented.

## 2021-01-30 NOTE — ED Notes (Signed)
Pt transported to endo.  

## 2021-01-30 NOTE — ED Notes (Signed)
Called [facility] for transport update patient has two people ahead of him

## 2021-01-31 ENCOUNTER — Encounter (HOSPITAL_COMMUNITY): Payer: Self-pay | Admitting: Internal Medicine

## 2021-02-01 ENCOUNTER — Encounter (HOSPITAL_COMMUNITY)
Admission: EM | Disposition: A | Discharge status: Home / Self Care | Payer: Self-pay | Source: Home / Self Care | Attending: Emergency Medicine

## 2021-02-01 ENCOUNTER — Encounter (HOSPITAL_COMMUNITY): Payer: Self-pay

## 2021-02-01 ENCOUNTER — Ambulatory Visit (HOSPITAL_COMMUNITY)
Admission: EM | Admit: 2021-02-01 | Discharge: 2021-02-02 | Disposition: A | Discharge status: Home / Self Care | Payer: Medicare Other | Attending: Gastroenterology | Admitting: Gastroenterology

## 2021-02-01 ENCOUNTER — Other Ambulatory Visit: Payer: Self-pay

## 2021-02-01 DIAGNOSIS — K5900 Constipation, unspecified: Secondary | ICD-10-CM | POA: Insufficient documentation

## 2021-02-01 DIAGNOSIS — I10 Essential (primary) hypertension: Secondary | ICD-10-CM | POA: Diagnosis not present

## 2021-02-01 DIAGNOSIS — Z79890 Hormone replacement therapy (postmenopausal): Secondary | ICD-10-CM | POA: Diagnosis not present

## 2021-02-01 DIAGNOSIS — E119 Type 2 diabetes mellitus without complications: Secondary | ICD-10-CM | POA: Insufficient documentation

## 2021-02-01 DIAGNOSIS — K449 Diaphragmatic hernia without obstruction or gangrene: Secondary | ICD-10-CM | POA: Insufficient documentation

## 2021-02-01 DIAGNOSIS — R1310 Dysphagia, unspecified: Secondary | ICD-10-CM | POA: Insufficient documentation

## 2021-02-01 DIAGNOSIS — T18128A Food in esophagus causing other injury, initial encounter: Secondary | ICD-10-CM

## 2021-02-01 DIAGNOSIS — K269 Duodenal ulcer, unspecified as acute or chronic, without hemorrhage or perforation: Secondary | ICD-10-CM | POA: Diagnosis not present

## 2021-02-01 DIAGNOSIS — E039 Hypothyroidism, unspecified: Secondary | ICD-10-CM | POA: Insufficient documentation

## 2021-02-01 DIAGNOSIS — K222 Esophageal obstruction: Secondary | ICD-10-CM

## 2021-02-01 DIAGNOSIS — D539 Nutritional anemia, unspecified: Secondary | ICD-10-CM | POA: Insufficient documentation

## 2021-02-01 DIAGNOSIS — K2100 Gastro-esophageal reflux disease with esophagitis, without bleeding: Secondary | ICD-10-CM | POA: Insufficient documentation

## 2021-02-01 DIAGNOSIS — Z79899 Other long term (current) drug therapy: Secondary | ICD-10-CM | POA: Diagnosis not present

## 2021-02-01 DIAGNOSIS — Z888 Allergy status to other drugs, medicaments and biological substances status: Secondary | ICD-10-CM | POA: Diagnosis not present

## 2021-02-01 DIAGNOSIS — K2981 Duodenitis with bleeding: Secondary | ICD-10-CM | POA: Diagnosis not present

## 2021-02-01 DIAGNOSIS — Z7982 Long term (current) use of aspirin: Secondary | ICD-10-CM | POA: Insufficient documentation

## 2021-02-01 DIAGNOSIS — E785 Hyperlipidemia, unspecified: Secondary | ICD-10-CM | POA: Insufficient documentation

## 2021-02-01 DIAGNOSIS — K2950 Unspecified chronic gastritis without bleeding: Secondary | ICD-10-CM | POA: Diagnosis not present

## 2021-02-01 HISTORY — PX: SAVORY DILATION: SHX5439

## 2021-02-01 HISTORY — PX: ESOPHAGOGASTRODUODENOSCOPY: SHX5428

## 2021-02-01 HISTORY — PX: BIOPSY: SHX5522

## 2021-02-01 LAB — CBC WITH DIFFERENTIAL/PLATELET
Abs Immature Granulocytes: 0.01 10*3/uL (ref 0.00–0.07)
Basophils Absolute: 0 10*3/uL (ref 0.0–0.1)
Basophils Relative: 0 %
Eosinophils Absolute: 0.2 10*3/uL (ref 0.0–0.5)
Eosinophils Relative: 4 %
HCT: 30 % — ABNORMAL LOW (ref 39.0–52.0)
Hemoglobin: 9.8 g/dL — ABNORMAL LOW (ref 13.0–17.0)
Immature Granulocytes: 0 %
Lymphocytes Relative: 25 %
Lymphs Abs: 1.5 10*3/uL (ref 0.7–4.0)
MCH: 35 pg — ABNORMAL HIGH (ref 26.0–34.0)
MCHC: 32.7 g/dL (ref 30.0–36.0)
MCV: 107.1 fL — ABNORMAL HIGH (ref 80.0–100.0)
Monocytes Absolute: 0.6 10*3/uL (ref 0.1–1.0)
Monocytes Relative: 10 %
Neutro Abs: 3.5 10*3/uL (ref 1.7–7.7)
Neutrophils Relative %: 61 %
Platelets: 179 10*3/uL (ref 150–400)
RBC: 2.8 MIL/uL — ABNORMAL LOW (ref 4.22–5.81)
RDW: 11.8 % (ref 11.5–15.5)
WBC: 5.8 10*3/uL (ref 4.0–10.5)
nRBC: 0 % (ref 0.0–0.2)

## 2021-02-01 LAB — BASIC METABOLIC PANEL
Anion gap: 10 (ref 5–15)
BUN: 46 mg/dL — ABNORMAL HIGH (ref 8–23)
CO2: 24 mmol/L (ref 22–32)
Calcium: 9 mg/dL (ref 8.9–10.3)
Chloride: 105 mmol/L (ref 98–111)
Creatinine, Ser: 1.68 mg/dL — ABNORMAL HIGH (ref 0.61–1.24)
GFR, Estimated: 37 mL/min — ABNORMAL LOW (ref >60)
Glucose, Bld: 104 mg/dL — ABNORMAL HIGH (ref 70–99)
Potassium: 4.5 mmol/L (ref 3.5–5.1)
Sodium: 139 mmol/L (ref 135–145)

## 2021-02-01 SURGERY — EGD (ESOPHAGOGASTRODUODENOSCOPY)
Anesthesia: Moderate Sedation

## 2021-02-01 MED ORDER — FENTANYL CITRATE (PF) 100 MCG/2ML IJ SOLN
INTRAMUSCULAR | Status: DC | PRN
  Administered 2021-02-01: 25 ug via INTRAVENOUS
  Administered 2021-02-01 (×2): 12.5 ug via INTRAVENOUS
  Administered 2021-02-01: 25 ug via INTRAVENOUS

## 2021-02-01 MED ORDER — FENTANYL CITRATE (PF) 100 MCG/2ML IJ SOLN
INTRAMUSCULAR | Status: AC
  Filled 2021-02-01: qty 2

## 2021-02-01 MED ORDER — MIDAZOLAM HCL (PF) 5 MG/ML IJ SOLN
INTRAMUSCULAR | Status: AC
  Filled 2021-02-01: qty 1

## 2021-02-01 MED ORDER — MIDAZOLAM HCL (PF) 10 MG/2ML IJ SOLN
INTRAMUSCULAR | Status: DC | PRN
  Administered 2021-02-01 (×3): 1 mg via INTRAVENOUS
  Administered 2021-02-01: 2 mg via INTRAVENOUS

## 2021-02-01 MED ORDER — SODIUM CHLORIDE 0.9 % IV SOLN: INTRAVENOUS | Status: DC

## 2021-02-01 MED ORDER — PANTOPRAZOLE SODIUM 40 MG PO TBEC: 40.0000 mg | DELAYED_RELEASE_TABLET | Freq: Every day | ORAL | 0 refills | Status: DC

## 2021-02-01 NOTE — Interval H&P Note (Signed)
History and Physical Interval Note:  02/01/2021 3:32 PM  Ricky Sandoval  has presented today for surgery, with the diagnosis of Acute, recurrent dysphagia..  The various methods of treatment have been discussed with the patient and family. After consideration of risks, benefits and other options for treatment, the patient has consented to  Procedure(s): ESOPHAGOGASTRODUODENOSCOPY (EGD) (N/A) BALLOON DILATION (N/A) as a surgical intervention.  The patient's history has been reviewed, patient examined, no change in status, stable for surgery.  I have reviewed the patient's chart and labs.  Questions were answered to the patient's satisfaction.     Venita Lick. Russella Dar

## 2021-02-01 NOTE — Consult Note (Addendum)
                                                                           [facility] Consult: 10:54 AM 02/01/2021  LOS: 0 days    Referring Provider: Dr Plunkett in ED  Primary Care Physician:  [name], [name], MD Primary Gastroenterologist:  Dr. John Perry saw patient over the weekend but is not an office patient.    Reason for Consultation: Recurrent dysphagia   HPI: Ricky Sandoval is a 87 y.o. male.  PMH  GI consult on 6/5 for dysphagia, food impaction.  Couple of weeks of difficulty swallowing mostly in the morning but on the day of arrival he had a acute dysphagia consistent with food impaction.  Reported 16 pound weight gain in recent weeks. 01/30/2021 EGD: Soft food impacted at distal esophagus was advanced w scope into the stomach without difficulty.  Esophagus tortuous.  Benign, ringlike stenosis at 35 cm from incisors measuring 1.4 cm inner diameter.  This was balloon dilated at 18-19-20 mm.  Moderate hiatal hernia, otherwise normal stomach.  Superficial duodenal erosions were biopsied.  CLO pathology has not resulted and there is no evidence that the lab process this though Endo staff delivered it to the lab in person.  Following the EGD he was discharged and did not require admission.  PPI listed as omeprazole 20 mg daily as recommended by Dr. Perry and as listed on his outpatient med list PTA. Yesterday patient was able to tolerate solid food with eggs, sausage in the morning.  Baked chicken in the evening.  Note difficulty swallowing some water this morning but at breakfast had acute onset of dysphagia and regurgitation of grits and water after swallowing grits.  The sensation of the food getting stuck is in the "throat"/upper esophagus.  Is brought back to the emergency department from assisted living.  No trouble breathing.  No nausea.  No abdominal pain.  Born again  Christian, does not consume alcohol or smoke. Widow since 2013.  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  Past Medical History:  Diagnosis Date  . Diabetes type 2, controlled (HCC)   . High cholesterol   . Hypertension   . Hypothyroid     Past Surgical History:  Procedure Laterality Date  . BALLOON DILATION N/A 01/30/2021   Procedure: BALLOON DILATION;  Surgeon: Perry, John N, MD;  Location: [facility]COPY;  Service: Endoscopy;  Laterality: N/A;  . BIOPSY  01/30/2021   Procedure: BIOPSY;  Surgeon: Perry, John N, MD;  Location: [facility] END[facility]COPY;  Service: Endoscopy;;  . ESOPHAGOGASTRODUODENOSCOPY (EGD) WITH PROPOFOL N/A 01/30/2021   Procedure: ESOPHAGOGASTRODUODENOSCOPY (EGD) WITH PROPOFOL;  Surgeon: Perry, John N, MD;  Location: [facility] ENDOSCOPY;  Service: Endoscopy;  Laterality: N/A;  . FOREIGN BODY REMOVAL  01/30/2021   Procedure: FOREIGN BODY REMOVAL;  Surgeon: Perry, John N, MD;  Location: [facility]OSCOPY;  Service: Endoscopy;;    Prior to Admission medications   Medication Sig Start Date End Date Taking? Authorizing Provider  acetaminophen (TYLENOL) 325 MG tablet Take 650 mg by mouth 2 (two) times daily as needed for   mild pain.    [provider]  acetaminophen (TYLENOL) 500 MG tablet Take 1,000 mg by mouth at bedtime.    [provider]  amLODipine (NORVASC) 2.5 MG tablet Take 2.5 mg by mouth daily.    [provider]  aspirin 81 MG tablet Take 81 mg by mouth daily.    [provider]  Boswellia-Glucosamine-Vit D (OSTEO BI-FLEX ONE PER DAY PO) Take 1 tablet by mouth daily. 1500MG -400MG     [provider]  Carboxymethylcellulose Sodium (REFRESH TEARS OP) Place 2 drops into both eyes 2 (two) times daily as needed (dry eyes).    [provider]  Cholecalciferol (VITAMIN D3) 50 MCG (2000 UT) TABS Take 2,000 Units by mouth daily.    [provider]   docusate sodium (COLACE) 100 MG capsule Take 300 mg by mouth daily.    [provider]  ferrous sulfate 325 (65 FE) MG tablet Take 325 mg by mouth daily with breakfast. Patient not taking: No sig reported    [provider]  flunisolide (NASALIDE) 25 MCG/ACT (0.025%) SOLN Place 2 sprays into the nose daily.    [provider]  HYDROcodone-acetaminophen (NORCO/VICODIN) 5-325 MG tablet Take 1 tablet by mouth every 6 (six) hours as needed for severe pain. Patient not taking: No sig reported 09/25/20   , PA  ketotifen (ZADITOR) 0.025 % ophthalmic solution Place 1 drop into both eyes 3 (three) times daily.    [provider]  levothyroxine (SYNTHROID, LEVOTHROID) 75 MCG tablet Take 75 mcg by mouth daily before breakfast.    [provider]  loratadine (CLARITIN) 10 MG tablet Take 10 mg by mouth daily.    [provider]  Menthol, Topical Analgesic, (ICY HOT MEDICATED SPRAY EX) Apply 1 application topically in the morning, at noon, in the evening, and at bedtime.    [provider]  metoprolol succinate (TOPROL-XL) 25 MG 24 hr tablet Take 25 mg by mouth daily.    [provider]  Multiple Vitamins-Minerals (CENTRUM SILVER PO) Take 1 tablet by mouth daily.    [provider]  Multiple Vitamins-Minerals (PRESERVISION AREDS PO) Take 2 capsules by mouth daily.    [provider]  nortriptyline (PAMELOR) 25 MG capsule Take 25 mg by mouth at bedtime.    [provider]  omeprazole (PRILOSEC) 20 MG capsule Take 1 capsule (20 mg total) by mouth daily. 01/30/21   Gailen Shelter, MD  simvastatin (ZOCOR) 40 MG tablet Take 20 mg by mouth at bedtime.    [provider]  vitamin C (ASCORBIC ACID) 500 MG tablet Take 500 mg by mouth daily.    [provider]    Scheduled Meds:  Infusions:  PRN Meds:    Allergies as of 02/01/2021 - Review Complete 01/30/2021  Allergen Reaction  Noted  . Lisinopril  11/23/2006    No family history on file.  Social History   Socioeconomic History  . Marital status: Widowed    Spouse name: Not on file  . Number of children: Not on file  . Years of education: Not on file  . Highest education level: Not on file  Occupational History  . Not on file  Tobacco Use  . Smoking status: Unknown If Ever Smoked  . Smokeless tobacco: Not on file  Substance and Sexual Activity  . Alcohol use: Not Currently  . Drug use: Never  . Sexual activity: Not on file  Other Topics Concern  . Not on  file  Social History Narrative  . Not on file   Social Determinants of Health   Financial Resource Strain: Not on file  Food Insecurity: Not on file  Transportation Needs: Not on file  Physical Activity: Not on file  Stress: Not on file  Social Connections: Not on file  Intimate Partner Violence: Not on file    REVIEW OF SYSTEMS: Constitutional: No weakness, no fatigue. ENT:  No nose bleeds Pulm: No shortness of breath or cough CV:  No palpitations, no LE edema.  No angina GU:  No hematuria, no frequency.  And able to urinate if he is recumbent and has to stand up in order to urinate GI: See HPI. Heme: No unusual or excessive bleeding or bruising.  Takes chronic oral iron Transfusions: None ever Neuro:  No headaches, no peripheral tingling or numbness.  No dizziness, no syncope. Musculoskeletal: Cannot ambulate without the assistance of a walker because of a degenerative right hip. Derm:  No itching, no rash or sores.  Endocrine:  No sweats or chills.  No polyuria or dysuria Immunization: Reviewed.  He had Moderna vaccination documented once in March 2021. Travel:  None beyond local counties in last few months.    PHYSICAL EXAM: Vital signs in last 24 hours: Vitals:   02/01/21 1000 02/01/21 1030  BP: (!) 159/64 (!) 184/77  Pulse: 65 70  Resp: (!) 22 16  SpO2: 95% 97%   Wt Readings from Last 3 Encounters:  01/30/21 72.6 kg   09/25/20 82.6 kg  05/07/17 82.6 kg    General: Alert, comfortable, talkative.  No distress.  Looks well for his mid 90s. Head: No facial asymmetry or swelling.  No signs of head trauma. Eyes: No conjunctival pallor.  EOMI Ears: Slightly HOH Nose: No congestion or discharge Mouth: Oropharynx moist, pink, clear.  Tongue midline.  Good dentition. Neck: JVD, no masses, no thyromegaly Lungs: Some crackles in the left base but no labored breathing or cough. Heart: RRR.  No MRG.  S1, S2 present Abdomen: Soft without tenderness or distention.  No HSM, masses, bruits, hernias..   Rectal: Deferred Musc/Skeltl: No joint redness, swelling or gross deformity Extremities: No CCE.  Feet are warm and dry. Neurologic: Alert.  Oriented x3.  Good historian.  Moves all 4 limbs, strength not tested.  No tremors. Skin: Slightly pale but no rashes, no telangiectasia.  No suspicious lesions or sores Nodes: No cervical adenopathy Psych: A bit cantankerous but cooperative.  Intake/Output from previous day: No intake/output data recorded. Intake/Output this shift: No intake/output data recorded.  LAB RESULTS: Recent Labs    01/30/21 0949  WBC 5.5  HGB 10.1*  HCT 30.5*  PLT 175   BMET Lab Results  Component Value Date   NA 140 01/30/2021   NA 137 06/10/2019   NA 137 11/23/2007   K 4.8 01/30/2021   K 4.6 06/10/2019   K 3.9 11/23/2007   CL 105 01/30/2021   CL 103 06/10/2019   CL 103 11/23/2007   CO2 27 01/30/2021   CO2 24 06/10/2019   CO2 29 11/23/2007   GLUCOSE 99 01/30/2021   GLUCOSE 147 (H) 06/10/2019   GLUCOSE 145 (H) 11/23/2007   BUN 30 (H) 01/30/2021   BUN 29 (H) 06/10/2019   BUN 13 11/23/2007   CREATININE 1.29 (H) 01/30/2021   CREATININE 1.24 06/10/2019   CREATININE 1.14 11/23/2007   CALCIUM 9.2 01/30/2021   CALCIUM 8.9 06/10/2019   CALCIUM 8.5 11/23/2007   LFT Recent Labs  01/30/21 0949  PROT 6.3*  ALBUMIN 3.7  AST 28  ALT 25  ALKPHOS 57  BILITOT 0.8    PT/INR Lab Results  Component Value Date   INR 0.9 11/13/2007     IMPRESSION:   *    Recurrent dysphagia in patient who underwent food disimpaction and dilation of distal esophageal stenosis 3 days ago.  The tortuous esophagus observed on EGD is concerning for motility disorder.  EGD revealed duodenal erosion.  CLO biopsy obtained but somehow this got canceled when he returned to the ED so the specimen was never processed.  *   Macrocytic anemia.  Hb 10.1, MCV 105.  Takes iron sulfate but not folate or B12 as an outpatient.    PLAN:     *    EGD with possible repeat dilation this afternoon with moderate sedation.  Suspect patient is going to need an esophagram to evaluate esophageal motility. Alerted ED to have hospitalist on standby in case patient needs to be admitted following the EGD. CLO testing can be repeated with EGD today.   Jennye Moccasin  02/01/2021, 10:54 AM Phone [phone]     Attending Physician Note   I have taken a history, examined the patient and reviewed the chart. I agree with the Advanced Practitioner's note, impression and recommendations.  Recurrent dysphagia, probable food impaction. Suspected motility disorder or persistent esophageal stricture.   EGD today.   Claudette Head, MD FACG [phone]

## 2021-02-01 NOTE — ED Notes (Signed)
[facility] called  

## 2021-02-01 NOTE — H&P (View-Only) (Signed)
[facility] Consult: 10:54 AM 02/01/2021  LOS: 0 days    Referring Provider: Dr Anitra LauthPlunkett in ED  Primary Care Physician:  Chilton GreathouseAvva, [name], MD Primary Gastroenterologist:  Dr. Yancey FlemingsJohn Perry saw patient over the weekend but is not an office patient.    Reason for Consultation: Recurrent dysphagia   HPI: Ricky Sandoval is a 87 y.o. male.  PMH  GI consult on 6/5 for dysphagia, food impaction.  Couple of weeks of difficulty swallowing mostly in the morning but on the day of arrival he had a acute dysphagia consistent with food impaction.  Reported 16 pound weight gain in recent weeks. 01/30/2021 EGD: Soft food impacted at distal esophagus was advanced w scope into the stomach without difficulty.  Esophagus tortuous.  Benign, ringlike stenosis at 35 cm from incisors measuring 1.4 cm inner diameter.  This was balloon dilated at 18-19-20 mm.  Moderate hiatal hernia, otherwise normal stomach.  Superficial duodenal erosions were biopsied.  CLO pathology has not resulted and there is no evidence that the lab process this though Endo staff delivered it to the lab in person.  Following the EGD he was discharged and did not require admission.  PPI listed as omeprazole 20 mg daily as recommended by Dr. Marina GoodellPerry and as listed on his outpatient med list PTA. Yesterday patient was able to tolerate solid food with eggs, sausage in the morning.  Baked chicken in the evening.  Note difficulty swallowing some water this morning but at breakfast had acute onset of dysphagia and regurgitation of grits and water after swallowing grits.  The sensation of the food getting stuck is in the "throat"/upper esophagus.  Is brought back to the emergency department from assisted living.  No trouble breathing.  No nausea.  No abdominal pain.  Born again  [place], does not consume alcohol or smoke. Widow since 2013.  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  Also worked in a foundry for 20 years.  Past Medical History:  Diagnosis Date  . Diabetes type 2, controlled (HCC)   . High cholesterol   . Hypertension   . Hypothyroid     Past Surgical History:  Procedure Laterality Date  . BALLOON DILATION N/A 01/30/2021   Procedure: BALLOON DILATION;  Surgeon: Hilarie FredricksonPerry, John N, MD;  [facility];  Service: Endoscopy;  Laterality: N/A;  . BIOPSY  01/30/2021   Procedure: BIOPSY;  Surgeon: Hilarie FredricksonPerry, John N, MD;  [facility] ENDOSCOPY;  Service: Endoscopy;;  . ESOPHAGOGASTRODUODENOSCOPY (EGD) WITH PROPOFOL N/A 01/30/2021   Procedure: ESOPHAGOGASTRODUODENOSCOPY (EGD) WITH PROPOFOL;  Surgeon: Hilarie FredricksonPerry, John N, MD;  [facility];  Service: Endoscopy;  Laterality: N/A;  . FOREIGN BODY REMOVAL  01/30/2021   Procedure: FOREIGN BODY REMOVAL;  Surgeon: Hilarie FredricksonPerry, John N, MD;  [facility];  Service: Endoscopy;;    Prior to Admission medications   Medication Sig Start Date End Date Taking? Authorizing Provider  acetaminophen (TYLENOL) 325 MG tablet Take 650 mg by mouth 2 (two) times daily as needed for  mild pain.    [provider]  acetaminophen (TYLENOL) 500 MG tablet Take 1,000 mg by mouth at bedtime.    [provider]  amLODipine (NORVASC) 2.5 MG tablet Take 2.5 mg by mouth daily.    [provider]  aspirin 81 MG tablet Take 81 mg by mouth daily.    [provider]  Boswellia-Glucosamine-Vit D (OSTEO BI-FLEX ONE PER DAY PO) Take 1 tablet by mouth daily. 1500MG -400MG     [provider]  Carboxymethylcellulose Sodium (REFRESH TEARS OP) Place 2 drops into both eyes 2 (two) times daily as needed (dry eyes).    [provider]  Cholecalciferol (VITAMIN D3) 50 MCG (2000 UT) TABS Take 2,000 Units by mouth daily.    [provider]   docusate sodium (COLACE) 100 MG capsule Take 300 mg by mouth daily.    [provider]  ferrous sulfate 325 (65 FE) MG tablet Take 325 mg by mouth daily with breakfast. Patient not taking: No sig reported    [provider]  flunisolide (NASALIDE) 25 MCG/ACT (0.025%) SOLN Place 2 sprays into the nose daily.    [provider]  HYDROcodone-acetaminophen (NORCO/VICODIN) 5-325 MG tablet Take 1 tablet by mouth every 6 (six) hours as needed for severe pain. Patient not taking: No sig reported 09/25/20   , PA  ketotifen (ZADITOR) 0.025 % ophthalmic solution Place 1 drop into both eyes 3 (three) times daily.    [provider]  levothyroxine (SYNTHROID, LEVOTHROID) 75 MCG tablet Take 75 mcg by mouth daily before breakfast.    [provider]  loratadine (CLARITIN) 10 MG tablet Take 10 mg by mouth daily.    [provider]  Menthol, Topical Analgesic, (ICY HOT MEDICATED SPRAY EX) Apply 1 application topically in the morning, at noon, in the evening, and at bedtime.    [provider]  metoprolol succinate (TOPROL-XL) 25 MG 24 hr tablet Take 25 mg by mouth daily.    [provider]  Multiple Vitamins-Minerals (CENTRUM SILVER PO) Take 1 tablet by mouth daily.    [provider]  Multiple Vitamins-Minerals (PRESERVISION AREDS PO) Take 2 capsules by mouth daily.    [provider]  nortriptyline (PAMELOR) 25 MG capsule Take 25 mg by mouth at bedtime.    [provider]  omeprazole (PRILOSEC) 20 MG capsule Take 1 capsule (20 mg total) by mouth daily. 01/30/21   Gailen Shelter, MD  simvastatin (ZOCOR) 40 MG tablet Take 20 mg by mouth at bedtime.    [provider]  vitamin C (ASCORBIC ACID) 500 MG tablet Take 500 mg by mouth daily.    [provider]    Scheduled Meds:  Infusions:  PRN Meds:    Allergies as of 02/01/2021 - Review Complete 01/30/2021  Allergen Reaction  Noted  . Lisinopril  11/23/2006    No family history on file.  Social History   Socioeconomic History  . Marital status: Widowed    Spouse name: Not on file  . Number of children: Not on file  . Years of education: Not on file  . Highest education level: Not on file  Occupational History  . Not on file  Tobacco Use  . Smoking status: Unknown If Ever Smoked  . Smokeless tobacco: Not on file  Substance and Sexual Activity  . Alcohol use: Not Currently  . Drug use: Never  . Sexual activity: Not on file  Other Topics Concern  . Not on  file  Social History Narrative  . Not on file   Social Determinants of Health   Financial Resource Strain: Not on file  Food Insecurity: Not on file  Transportation Needs: Not on file  Physical Activity: Not on file  Stress: Not on file  Social Connections: Not on file  Intimate Partner Violence: Not on file    REVIEW OF SYSTEMS: Constitutional: No weakness, no fatigue. ENT:  No nose bleeds Pulm: No shortness of breath or cough CV:  No palpitations, no LE edema.  No angina GU:  No hematuria, no frequency.  And able to urinate if he is recumbent and has to stand up in order to urinate GI: See HPI. Heme: No unusual or excessive bleeding or bruising.  Takes chronic oral iron Transfusions: None ever Neuro:  No headaches, no peripheral tingling or numbness.  No dizziness, no syncope. Musculoskeletal: Cannot ambulate without the assistance of a walker because of a degenerative right hip. Derm:  No itching, no rash or sores.  Endocrine:  No sweats or chills.  No polyuria or dysuria Immunization: Reviewed.  He had Moderna vaccination documented once in March 2021. Travel:  None beyond local counties in last few months.    PHYSICAL EXAM: Vital signs in last 24 hours: Vitals:   02/01/21 1000 02/01/21 1030  BP: (!) 159/64 (!) 184/77  Pulse: 65 70  Resp: (!) 22 16  SpO2: 95% 97%   Wt Readings from Last 3 Encounters:  01/30/21 72.6 kg   09/25/20 82.6 kg  05/07/17 82.6 kg    General: Alert, comfortable, talkative.  No distress.  Looks well for his mid 90s. Head: No facial asymmetry or swelling.  No signs of head trauma. Eyes: No conjunctival pallor.  EOMI Ears: Slightly HOH Nose: No congestion or discharge Mouth: Oropharynx moist, pink, clear.  Tongue midline.  Good dentition. Neck: JVD, no masses, no thyromegaly Lungs: Some crackles in the left base but no labored breathing or cough. Heart: RRR.  No MRG.  S1, S2 present Abdomen: Soft without tenderness or distention.  No HSM, masses, bruits, hernias..   Rectal: Deferred Musc/Skeltl: No joint redness, swelling or gross deformity Extremities: No CCE.  Feet are warm and dry. Neurologic: Alert.  Oriented x3.  Good historian.  Moves all 4 limbs, strength not tested.  No tremors. Skin: Slightly pale but no rashes, no telangiectasia.  No suspicious lesions or sores Nodes: No cervical adenopathy Psych: A bit cantankerous but cooperative.  Intake/Output from previous day: No intake/output data recorded. Intake/Output this shift: No intake/output data recorded.  LAB RESULTS: Recent Labs    01/30/21 0949  WBC 5.5  HGB 10.1*  HCT 30.5*  PLT 175   BMET Lab Results  Component Value Date   NA 140 01/30/2021   NA 137 06/10/2019   NA 137 11/23/2007   K 4.8 01/30/2021   K 4.6 06/10/2019   K 3.9 11/23/2007   CL 105 01/30/2021   CL 103 06/10/2019   CL 103 11/23/2007   CO2 27 01/30/2021   CO2 24 06/10/2019   CO2 29 11/23/2007   GLUCOSE 99 01/30/2021   GLUCOSE 147 (H) 06/10/2019   GLUCOSE 145 (H) 11/23/2007   BUN 30 (H) 01/30/2021   BUN 29 (H) 06/10/2019   BUN 13 11/23/2007   CREATININE 1.29 (H) 01/30/2021   CREATININE 1.24 06/10/2019   CREATININE 1.14 11/23/2007   CALCIUM 9.2 01/30/2021   CALCIUM 8.9 06/10/2019   CALCIUM 8.5 11/23/2007   LFT Recent Labs  01/30/21 0949  PROT 6.3*  ALBUMIN 3.7  AST 28  ALT 25  ALKPHOS 57  BILITOT 0.8    PT/INR Lab Results  Component Value Date   INR 0.9 11/13/2007     IMPRESSION:   *    Recurrent dysphagia in patient who underwent food disimpaction and dilation of distal esophageal stenosis 3 days ago.  The tortuous esophagus observed on EGD is concerning for motility disorder.  EGD revealed duodenal erosion.  CLO biopsy obtained but somehow this got canceled when he returned to the ED so the specimen was never processed.  *   Macrocytic anemia.  Hb 10.1, MCV 105.  Takes iron sulfate but not folate or B12 as an outpatient.    PLAN:     *    EGD with possible repeat dilation this afternoon with moderate sedation.  Suspect patient is going to need an esophagram to evaluate esophageal motility. Alerted ED to have hospitalist on standby in case patient needs to be admitted following the EGD. CLO testing can be repeated with EGD today.   Jennye Moccasin  02/01/2021, 10:54 AM Phone [phone]     Attending Physician Note   I have taken a history, examined the patient and reviewed the chart. I agree with the Advanced Practitioner's note, impression and recommendations.  Recurrent dysphagia, probable food impaction. Suspected motility disorder or persistent esophageal stricture.   EGD today.   Claudette Head, MD FACG [phone]

## 2021-02-01 NOTE — ED Notes (Signed)
Patient waiting for PTAR , no distress/respirations unlabored.

## 2021-02-01 NOTE — ED Provider Notes (Addendum)
[facility] EMERGENCY DEPARTMENT Provider Note   CSN: [id number] Arrival date & time: 02/01/21  3646     History Chief Complaint  Patient presents with  . Dysphagia    Ricky Sandoval is a 87 y.o. male with hx of DM, HTN, HLD, hypothyroidism presenting with inability to swallow this morning. States that he was trying to eat grits, but was unable to swallow any of it and regurgitated it back up. Then tried to drink water but also unable to swallow that and regurgitated it as well. States it has been a little difficult to handle his secretions. He was seen in the ED on 6/5 with complaint of difficulty swallowing over the prior 2 weeks. Received an upper endoscopy during that visit which demonstrated:    The esophagus was impacted with large volume of soft food material which was advanced into the stomach without difficulty. Esophagus was somewhat tortuous.   One benign-appearing ringlike stenosis with inner diameter of 1.4cm which was dilated with a balloon to 95mm.   Hiatal hernia  Superficial erosions of duodenum. Unable to see biopsy results, likely still pending  He was started on Prilosec. He is unsure if he's been receiving this, lives at a long term care facility. Yesterday morning, he feels like it took a while to "get going" with juice and other soft foods but afterwards was able to tolerate pancakes, and swallowed normally the rest of the day including his medications. No recent weight loss. Not much throat pain. He does also complain of constipation which is unusual for him, reports manually disimpacting himself this morning with some relief afterwards but no sizeable bowel movement. No other new symptoms since last ED visit.   Past Medical History:  Diagnosis Date  . Diabetes type 2, controlled (HCC)   . High cholesterol   . Hypertension   . Hypothyroid     Patient Active Problem List   Diagnosis Date Noted  . Food impaction of esophagus   . Duodenal  erosion   . Esophageal stricture     Past Surgical History:  Procedure Laterality Date  . BALLOON DILATION N/A 01/30/2021   Procedure: BALLOON DILATION;  Surgeon: Hilarie Fredrickson, MD;  Location: [facility] ENDOSCOPY;  Service: Endoscopy;  Laterality: N/A;  . BIOPSY  01/30/2021   Procedure: BIOPSY;  Surgeon: Hilarie Fredrickson, MD;  Location: [facility] ENDOSCOPY;  Service: Endoscopy;;  . ESOPHAGOGASTRODUODENOSCOPY (EGD) WITH PROPOFOL N/A 01/30/2021   Procedure: ESOPHAGOGASTRODUODENOSCOPY (EGD) WITH PROPOFOL;  Surgeon: Hilarie Fredrickson, MD;  Location: [facility] ENDOSCOPY;  Service: Endoscopy;  Laterality: N/A;  . FOREIGN BODY REMOVAL  01/30/2021   Procedure: FOREIGN BODY REMOVAL;  Surgeon: Hilarie Fredrickson, MD;  Location: [facility] ENDOSCOPY;  Service: Endoscopy;;       No family history on file.  Social History   Tobacco Use  . Smoking status: Unknown If Ever Smoked  Substance Use Topics  . Alcohol use: Not Currently  . Drug use: Never    Home Medications Prior to Admission medications   Medication Sig Start Date End Date Taking? Authorizing Provider  acetaminophen (TYLENOL) 325 MG tablet Take 650 mg by mouth 2 (two) times daily as needed for mild pain.    [provider]  acetaminophen (TYLENOL) 500 MG tablet Take 1,000 mg by mouth at bedtime.    [provider]  amLODipine (NORVASC) 2.5 MG tablet Take 2.5 mg by mouth daily.    [provider]  aspirin 81 MG tablet Take 81  mg by mouth daily.    [provider]  Boswellia-Glucosamine-Vit D (OSTEO BI-FLEX ONE PER DAY PO) Take 1 tablet by mouth daily. 1500MG -400MG     [provider]  Carboxymethylcellulose Sodium (REFRESH TEARS OP) Place 2 drops into both eyes 2 (two) times daily as needed (dry eyes).    [provider]  Cholecalciferol (VITAMIN D3) 50 MCG (2000 UT) TABS Take 2,000 Units by mouth daily.    [provider]  docusate sodium (COLACE) 100 MG capsule Take 300 mg by mouth daily.    [provider]   ferrous sulfate 325 (65 FE) MG tablet Take 325 mg by mouth daily with breakfast. Patient not taking: No sig reported    [provider]  flunisolide (NASALIDE) 25 MCG/ACT (0.025%) SOLN Place 2 sprays into the nose daily.    [provider]  HYDROcodone-acetaminophen (NORCO/VICODIN) 5-325 MG tablet Take 1 tablet by mouth every 6 (six) hours as needed for severe pain. Patient not taking: No sig reported 09/25/20   , PA  ketotifen (ZADITOR) 0.025 % ophthalmic solution Place 1 drop into both eyes 3 (three) times daily.    [provider]  levothyroxine (SYNTHROID, LEVOTHROID) 75 MCG tablet Take 75 mcg by mouth daily before breakfast.    [provider]  loratadine (CLARITIN) 10 MG tablet Take 10 mg by mouth daily.    [provider]  Menthol, Topical Analgesic, (ICY HOT MEDICATED SPRAY EX) Apply 1 application topically in the morning, at noon, in the evening, and at bedtime.    [provider]  metoprolol succinate (TOPROL-XL) 25 MG 24 hr tablet Take 25 mg by mouth daily.    [provider]  Multiple Vitamins-Minerals (CENTRUM SILVER PO) Take 1 tablet by mouth daily.    [provider]  Multiple Vitamins-Minerals (PRESERVISION AREDS PO) Take 2 capsules by mouth daily.    [provider]  nortriptyline (PAMELOR) 25 MG capsule Take 25 mg by mouth at bedtime.    [provider]  omeprazole (PRILOSEC) 20 MG capsule Take 1 capsule (20 mg total) by mouth daily. 01/30/21   Gailen Shelter, MD  simvastatin (ZOCOR) 40 MG tablet Take 20 mg by mouth at bedtime.    [provider]  vitamin C (ASCORBIC ACID) 500 MG tablet Take 500 mg by mouth daily.    [provider]    Allergies    Lisinopril  Review of Systems   Review of Systems  Constitutional: Negative for chills and fever.  Respiratory: Negative for cough and shortness of breath.   Gastrointestinal: Positive for constipation.  Negative for abdominal distention, abdominal pain, diarrhea and nausea.  Neurological: Negative for dizziness and syncope.  All other systems reviewed and are negative.   Physical Exam Updated Vital Signs SpO2 97%   Physical Exam Vitals and nursing note reviewed.  Constitutional:      General: He is not in acute distress.    Appearance: Normal appearance. He is not ill-appearing.  HENT:     Head: Normocephalic and atraumatic.     Right Ear: External ear normal.     Left Ear: External ear normal.     Nose: Nose normal.     Mouth/Throat:     Mouth: Mucous membranes are moist.     Pharynx: Oropharynx is clear. No oropharyngeal exudate or posterior oropharyngeal erythema.     Comments: Clears his throat often Small hematoma to posterior oropharynx but no significant swelling or other evident injury  Eyes:     Extraocular Movements: Extraocular movements intact.  Cardiovascular:     Rate and Rhythm: Normal rate and regular rhythm.     Heart sounds: No murmur heard. No friction rub. No gallop.   Pulmonary:     Effort: Pulmonary effort is normal. No respiratory distress.     Breath sounds: No stridor. No wheezing, rhonchi or rales.  Abdominal:     General: Abdomen is flat.     Palpations: Abdomen is soft.     Tenderness: There is no abdominal tenderness. There is no guarding.  Musculoskeletal:        General: No swelling or deformity. Normal range of motion.     Cervical back: Normal range of motion and neck supple.  Skin:    General: Skin is warm and dry.  Neurological:     General: No focal deficit present.     Mental Status: He is alert and oriented to person, place, and time.  Psychiatric:        Mood and Affect: Mood normal.        Behavior: Behavior normal.     ED Results / Procedures / Treatments   Labs (all labs ordered are listed, but only abnormal results are displayed) Labs Reviewed - No data to display  EKG EKG Interpretation  Date/Time:  Tuesday February 01 2021 09:30:21 EDT Ventricular Rate:  71 PR Interval:  224 QRS Duration: 98 QT Interval:  407 QTC Calculation: 443 R Axis:   22 Text Interpretation: Sinus rhythm Prolonged PR interval Anterior infarct, old No significant change since last tracing Confirmed by Gwyneth Sprout ([id number]) on 02/01/2021 9:33:42 AM   Radiology No results found.  Procedures Procedures   Medications Ordered in ED Medications - No data to display  ED Course  I have reviewed the triage vital signs and the nursing notes.  Pertinent labs & imaging results that were available during my care of the patient were reviewed by me and considered in my medical decision making (see chart for details).    MDM Rules/Calculators/A&P                          Patient presents with recurrent dysphagia after being seen for the same 2 days ago, had EGD which demonstrated impacted soft food which was easily advanced likely 2/2 ringlike stenosis which was dilated to 55mm. He tolerated food and liquids well yesterday, but was unable to swallow any solids or liquids at breakfast today. Does not endorse significant pain. There is a small hematoma on the posterior oropharynx but no significant swelling or erythema. Esophageal rupture is unlikely. He could have aspirated to some degree, but not fever, cough, shortness of breath, so would not explain his chief complaint.   Will consult GI.  GI planning for EGD this afternoon, possibly esophagram later. May need admission. Will continue to follow for admission status.   Blood work with creatinine elevation to 1.68, was 1.29 on prior. Otherwise unremarkable labs.  Patient signed out to Dr. Hyacinth Meeker.  Final Clinical Impression(s) / ED Diagnoses Final diagnoses:  Dysphagia, unspecified type    Rx / DC Orders ED Discharge Orders    None       Remo Lipps, MD 02/01/21 1312    Gwyneth Sprout, MD 02/01/21 1403    Remo Lipps, MD 02/01/21 1527    Gwyneth Sprout, MD 02/03/21 [phone]

## 2021-02-01 NOTE — ED Triage Notes (Signed)
BIB [facility] after staff at [facility] called to report pt c/o of increase difficulty swallowing post esophageal stretch 01/30/21.

## 2021-02-01 NOTE — ED Notes (Signed)
Pt going to Endo at this time. 

## 2021-02-01 NOTE — Op Note (Signed)
[facility] Patient Name: Ricky Sandoval Procedure Date : 02/01/2021 MRN: [id number] Attending MD: Meryl Dare , MD Date of Birth: 01/15/1922 CSN: [id number] Age: 87 Admit Type: Inpatient Procedure:                Upper GI endoscopy Indications:              Dysphagia Providers:                Venita Lick. Russella Dar, MD Referring MD:             Gwyneth Sprout, MD Medicines:                Midazolam 5 mg IV, Meperidine 75 mg IV Complications:            No immediate complications. Estimated Blood Loss:     Estimated blood loss was minimal. Procedure:                Pre-Anesthesia Assessment:                           - Prior to the procedure, a History and Physical                            was performed, and patient medications and                            allergies were reviewed. The patient's tolerance of                            previous anesthesia was also reviewed. The risks                            and benefits of the procedure and the sedation                            options and risks were discussed with the patient.                            All questions were answered, and informed consent                            was obtained. Prior Anticoagulants: The patient has                            taken no previous anticoagulant or antiplatelet                            agents. ASA Grade Assessment: II - A patient with                            mild systemic disease. After reviewing the risks                            and benefits, the patient was deemed in  satisfactory condition to undergo the procedure.                           After obtaining informed consent, the endoscope was                            passed under direct vision. Throughout the                            procedure, the patient's blood pressure, pulse, and                            oxygen saturations were monitored continuously. The                             GIF-[id number] ([id number] Olympus gastroscope was                            introduced through the mouth, and advanced to the                            second part of duodenum. The upper GI endoscopy was                            accomplished without difficulty. The patient                            tolerated the procedure well. Scope In: Scope Out: Findings:      One benign-appearing, intrinsic moderate stenosis was found at the       gastroesophageal junction. This stenosis measured 1.4 cm (inner       diameter) x less than one cm (in length). The stenosis was traversed. A       guidewire was placed and the scope was withdrawn. Dilations were       performed with Savary dilators with mild resistance at 14 mm, 15 mm, 16       mm and 17 mm. No heme noted. There was no food impaction.      LA Grade C (one or more mucosal breaks continuous between tops of 2 or       more mucosal folds, less than 75% circumference) esophagitis with no       bleeding was found in the distal esophagus.      The exam of the esophagus was otherwise normal.      Localized mildly erythematous mucosa without bleeding was found in the       gastric body and in the gastric antrum, suspect these were sites of       gastric biopsies from 6/5. Biopsies of other areas were taken with a       cold forceps for histology-R/O H pylori.      A medium-sized hiatal hernia was present.      The exam of the stomach was otherwise normal.      A few diffuse erosions without bleeding were found in the duodenal bulb       and in the second portion of the duodenum.      The exam of the duodenum  was otherwise normal. Impression:               - Benign-appearing esophageal stenosis. Dilated.                           - LA Grade C reflux esophagitis with no bleeding.                           - Erythematous mucosa in the gastric body and                            antrum. Biopsied.                           - Medium-sized hiatal  hernia.                           - Duodenal erosions without bleeding. Moderate Sedation:      Moderate (conscious) sedation was administered by the endoscopy nurse       and supervised by the endoscopist. The following parameters were       monitored: oxygen saturation, heart rate, blood pressure, respiratory       rate, EKG, adequacy of pulmonary ventilation, and response to care.       Total physician intraservice time was 15 minutes. Recommendation:           - Clear liquid diet today, then advance to full                            liquid diet tomorrow, then advance to a soft diet.                           - Patient has a contact number available for                            emergencies. The signs and symptoms of potential                            delayed complications were discussed with the                            patient. Return to normal activities tomorrow.                            Written discharge instructions were provided to the                            patient.                           - OK for discharge today.                           - Follow antireflux measures.                           - Protonix (pantoprazole) 40  mg PO daily.                           - GI office appt with Dr. Marina Goodell in 1 month. Procedure Code(s):        --- Professional ---                           [phone], Moderate sedation services provided by the                            same physician or other qualified health care                            professional performing the diagnostic or                            therapeutic service that the sedation supports,                            requiring the presence of an independent trained                            observer to assist in the monitoring of the                            patient's level of consciousness and physiological                            status; initial 15 minutes of intraservice time,                             patient age 59 years or older Diagnosis Code(s):        --- Professional ---                           K22.2, Esophageal obstruction                           K21.00, Gastro-esophageal reflux disease with                            esophagitis, without bleeding                           K31.89, Other diseases of stomach and duodenum                           K44.9, Diaphragmatic hernia without obstruction or                            gangrene                           K26.9, Duodenal ulcer, unspecified as acute or  chronic, without hemorrhage or perforation                           R13.10, Dysphagia, unspecified CPT copyright 2019 American Medical Association. All rights reserved. The codes documented in this report are preliminary and upon coder review may  be revised to meet current compliance requirements. Meryl Dare, MD 02/01/2021 4:12:25 PM This report has been signed electronically. Number of Addenda: 0

## 2021-02-01 NOTE — Discharge Instructions (Signed)
Clear liquid diet today, then advance to full liquid diet tomorrow, then advance to a soft diet. - Patient has a contact number available for emergencies. The signs and symptoms of potential delayed complications were discussed with the patient. Return to normal activities tomorrow. Written discharge instructions were provided to the patient. - OK for discharge today. - Follow antireflux measures. - Protonix (pantoprazole) 40 mg PO daily. - GI office appt with Dr. Marina Goodell in 1 month.

## 2021-02-02 ENCOUNTER — Encounter (HOSPITAL_COMMUNITY): Payer: Self-pay | Admitting: Gastroenterology

## 2021-02-03 LAB — SURGICAL PATHOLOGY

## 2021-02-07 ENCOUNTER — Encounter (HOSPITAL_COMMUNITY): Payer: Self-pay | Admitting: Student

## 2021-02-07 ENCOUNTER — Inpatient Hospital Stay (HOSPITAL_COMMUNITY)
Admission: EM | Admit: 2021-02-07 | Discharge: 2021-02-10 | DRG: 382 | Disposition: A | Discharge status: Skilled Nursing Facility | Payer: Medicare Other | Source: Skilled Nursing Facility | Attending: Internal Medicine | Admitting: Internal Medicine

## 2021-02-07 ENCOUNTER — Other Ambulatory Visit: Payer: Self-pay

## 2021-02-07 DIAGNOSIS — Z79899 Other long term (current) drug therapy: Secondary | ICD-10-CM

## 2021-02-07 DIAGNOSIS — E7800 Pure hypercholesterolemia, unspecified: Secondary | ICD-10-CM | POA: Diagnosis present

## 2021-02-07 DIAGNOSIS — N1831 Chronic kidney disease, stage 3a: Secondary | ICD-10-CM | POA: Diagnosis present

## 2021-02-07 DIAGNOSIS — R262 Difficulty in walking, not elsewhere classified: Secondary | ICD-10-CM | POA: Diagnosis present

## 2021-02-07 DIAGNOSIS — R1310 Dysphagia, unspecified: Secondary | ICD-10-CM | POA: Diagnosis not present

## 2021-02-07 DIAGNOSIS — K2210 Ulcer of esophagus without bleeding: Principal | ICD-10-CM | POA: Diagnosis present

## 2021-02-07 DIAGNOSIS — K449 Diaphragmatic hernia without obstruction or gangrene: Secondary | ICD-10-CM | POA: Diagnosis present

## 2021-02-07 DIAGNOSIS — T18128A Food in esophagus causing other injury, initial encounter: Secondary | ICD-10-CM | POA: Diagnosis not present

## 2021-02-07 DIAGNOSIS — K222 Esophageal obstruction: Secondary | ICD-10-CM | POA: Diagnosis present

## 2021-02-07 DIAGNOSIS — K2950 Unspecified chronic gastritis without bleeding: Secondary | ICD-10-CM | POA: Diagnosis present

## 2021-02-07 DIAGNOSIS — D539 Nutritional anemia, unspecified: Secondary | ICD-10-CM | POA: Diagnosis present

## 2021-02-07 DIAGNOSIS — Z20822 Contact with and (suspected) exposure to covid-19: Secondary | ICD-10-CM | POA: Diagnosis present

## 2021-02-07 DIAGNOSIS — E785 Hyperlipidemia, unspecified: Secondary | ICD-10-CM | POA: Diagnosis present

## 2021-02-07 DIAGNOSIS — E1122 Type 2 diabetes mellitus with diabetic chronic kidney disease: Secondary | ICD-10-CM | POA: Diagnosis present

## 2021-02-07 DIAGNOSIS — Z79890 Hormone replacement therapy (postmenopausal): Secondary | ICD-10-CM

## 2021-02-07 DIAGNOSIS — E039 Hypothyroidism, unspecified: Secondary | ICD-10-CM | POA: Diagnosis present

## 2021-02-07 DIAGNOSIS — Z7982 Long term (current) use of aspirin: Secondary | ICD-10-CM

## 2021-02-07 DIAGNOSIS — R1319 Other dysphagia: Secondary | ICD-10-CM

## 2021-02-07 DIAGNOSIS — I129 Hypertensive chronic kidney disease with stage 1 through stage 4 chronic kidney disease, or unspecified chronic kidney disease: Secondary | ICD-10-CM | POA: Diagnosis present

## 2021-02-07 DIAGNOSIS — R1314 Dysphagia, pharyngoesophageal phase: Secondary | ICD-10-CM | POA: Diagnosis present

## 2021-02-07 DIAGNOSIS — E538 Deficiency of other specified B group vitamins: Secondary | ICD-10-CM | POA: Diagnosis present

## 2021-02-07 DIAGNOSIS — K2990 Gastroduodenitis, unspecified, without bleeding: Secondary | ICD-10-CM | POA: Diagnosis present

## 2021-02-07 DIAGNOSIS — Z888 Allergy status to other drugs, medicaments and biological substances status: Secondary | ICD-10-CM

## 2021-02-07 LAB — CBC
HCT: 30.8 % — ABNORMAL LOW (ref 39.0–52.0)
Hemoglobin: 10.3 g/dL — ABNORMAL LOW (ref 13.0–17.0)
MCH: 35 pg — ABNORMAL HIGH (ref 26.0–34.0)
MCHC: 33.4 g/dL (ref 30.0–36.0)
MCV: 104.8 fL — ABNORMAL HIGH (ref 80.0–100.0)
Platelets: 198 10*3/uL (ref 150–400)
RBC: 2.94 MIL/uL — ABNORMAL LOW (ref 4.22–5.81)
RDW: 11.7 % (ref 11.5–15.5)
WBC: 6.4 10*3/uL (ref 4.0–10.5)
nRBC: 0 % (ref 0.0–0.2)

## 2021-02-07 LAB — COMPREHENSIVE METABOLIC PANEL
ALT: 28 U/L (ref 0–44)
AST: 31 U/L (ref 15–41)
Albumin: 3.9 g/dL (ref 3.5–5.0)
Alkaline Phosphatase: 60 U/L (ref 38–126)
Anion gap: 7 (ref 5–15)
BUN: 29 mg/dL — ABNORMAL HIGH (ref 8–23)
CO2: 27 mmol/L (ref 22–32)
Calcium: 9.1 mg/dL (ref 8.9–10.3)
Chloride: 105 mmol/L (ref 98–111)
Creatinine, Ser: 1.29 mg/dL — ABNORMAL HIGH (ref 0.61–1.24)
GFR, Estimated: 51 mL/min — ABNORMAL LOW (ref >60)
Glucose, Bld: 83 mg/dL (ref 70–99)
Potassium: 4.3 mmol/L (ref 3.5–5.1)
Sodium: 139 mmol/L (ref 135–145)
Total Bilirubin: 0.4 mg/dL (ref 0.3–1.2)
Total Protein: 6.8 g/dL (ref 6.5–8.1)

## 2021-02-07 LAB — VITAMIN B12: Vitamin B-12: 677 pg/mL (ref 180–914)

## 2021-02-07 LAB — IRON AND TIBC
Iron: 42 ug/dL — ABNORMAL LOW (ref 45–182)
Saturation Ratios: 16 % — ABNORMAL LOW (ref 17.9–39.5)
TIBC: 262 ug/dL (ref 250–450)
UIBC: 220 ug/dL

## 2021-02-07 LAB — FERRITIN: Ferritin: 1243 ng/mL — ABNORMAL HIGH (ref 24–336)

## 2021-02-07 LAB — FOLATE: Folate: 46.7 ng/mL (ref >5.9)

## 2021-02-07 LAB — POC SARS CORONAVIRUS 2 AG -  ED: SARSCOV2ONAVIRUS 2 AG: NEGATIVE

## 2021-02-07 MED ORDER — DEXTROSE IN LACTATED RINGERS 5 % IV SOLN: INTRAVENOUS | Status: DC

## 2021-02-07 MED ORDER — FLUTICASONE PROPIONATE 50 MCG/ACT NA SUSP
1.0000 | Freq: Every day | NASAL | Status: DC
  Administered 2021-02-08 – 2021-02-10 (×2): 1 via NASAL
  Filled 2021-02-07: qty 16

## 2021-02-07 MED ORDER — METOPROLOL TARTRATE 5 MG/5ML IV SOLN
5.0000 mg | Freq: Four times a day (QID) | INTRAVENOUS | Status: DC | PRN
  Administered 2021-02-08: 5 mg via INTRAVENOUS
  Filled 2021-02-07: qty 5

## 2021-02-07 MED ORDER — POLYVINYL ALCOHOL 1.4 % OP SOLN
1.0000 [drp] | Freq: Two times a day (BID) | OPHTHALMIC | Status: DC | PRN
  Filled 2021-02-07: qty 15

## 2021-02-07 MED ORDER — ONDANSETRON HCL 4 MG/2ML IJ SOLN
4.0000 mg | Freq: Four times a day (QID) | INTRAMUSCULAR | Status: DC | PRN
  Administered 2021-02-08: 4 mg via INTRAVENOUS
  Filled 2021-02-07: qty 2

## 2021-02-07 MED ORDER — KETOTIFEN FUMARATE 0.025 % OP SOLN
1.0000 [drp] | Freq: Three times a day (TID) | OPHTHALMIC | Status: DC
  Administered 2021-02-08 – 2021-02-10 (×6): 1 [drp] via OPHTHALMIC
  Filled 2021-02-07 (×2): qty 5

## 2021-02-07 MED ORDER — PANTOPRAZOLE SODIUM 40 MG IV SOLR
40.0000 mg | Freq: Two times a day (BID) | INTRAVENOUS | Status: DC
  Administered 2021-02-07 – 2021-02-09 (×5): 40 mg via INTRAVENOUS
  Filled 2021-02-07 (×4): qty 40

## 2021-02-07 NOTE — Anesthesia Preprocedure Evaluation (Addendum)
Anesthesia Evaluation    Reviewed: Allergy & Precautions, Patient's Chart, lab work & pertinent test results  Airway Mallampati: II  TM Distance: >3 FB Neck ROM: Full    Dental no notable dental hx. (+) Teeth Intact, Dental Advisory Given   Pulmonary neg pulmonary ROS,    Pulmonary exam normal breath sounds clear to auscultation       Cardiovascular hypertension, Pt. on medications Normal cardiovascular exam Rhythm:Regular Rate:Normal     Neuro/Psych negative neurological ROS     GI/Hepatic PUD,   Endo/Other  diabetes, Type 2Hypothyroidism   Renal/GU      Musculoskeletal negative musculoskeletal ROS (+)   Abdominal   Peds  Hematology negative hematology ROS (+)   Anesthesia Other Findings   Reproductive/Obstetrics                            Anesthesia Physical Anesthesia Plan  ASA: 3  Anesthesia Plan: MAC   Post-op Pain Management:    Induction: Intravenous  PONV Risk Score and Plan: Treatment may vary due to age or medical condition  Airway Management Planned: Natural Airway  Additional Equipment:   Intra-op Plan:   Post-operative Plan:   Informed Consent: I have reviewed the patients History and Physical, chart, labs and discussed the procedure including the risks, benefits and alternatives for the proposed anesthesia with the patient or authorized representative who has indicated his/her understanding and acceptance.     Dental advisory given  Plan Discussed with: CRNA and Anesthesiologist  Anesthesia Plan Comments: (EGD for dysphagia)       Anesthesia Quick Evaluation

## 2021-02-07 NOTE — Consult Note (Addendum)
Referring Provider: Dr. Derwood Kaplan  Primary Care Physician:  Chilton Greathouse, MD Primary Gastroenterologist:  Gentry Fitz, EGD by Dr. Marina Goodell and Dr. 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  He presented to the ED 01/30/2021 with a food bolus impaction.  He underwent an EGD by Dr. Marina Goodell and the food impaction was successfully removed.  An underlying distal esophageal ring and hiatal hernia were noted.  Duodenal erosions were identified.  He was prescribed Omeprazole 20 mg daily and to follow-up as needed.  He had recurrent dysphagia and presented back to the ED 02/01/2021.  He underwent a repeat EGD by Dr. Russella Dar which identified a benign-appearing esophageal stenosis which was dilated, grade C reflux esophagitis, gastritis, duodenal erosion without bleeding and a medium size hiatal hernia.  Biopsies showed mild chronic gastritis, no evidence of H. pylori.  He was prescribed Pantoprazole 40 mg p.o. daily with recommendations to follow-up in our office with Dr. Marina Goodell in 1 month.    He reported taking Pantoprazole 40mg  QD as prescribed and administered by the nurses at his assisted living facility. He stated he was swallowing without difficulty following his repeat EGD on 02/01/2021.  However, he had recurrence of dysphagia this morning. He drank a few sips of water at breakfast time this morning then ate several bites egg without difficulty. He ate a small piece of pancake with syrup which immediately became stuck in his upper esophagus. He then vomited out the water, egg and piece of pancake. No difficulty managing his saliva/secretions. He was transported by EMS from [facility] to [facility] ED for further evaluation.   He takes aspirin 81 mg once daily.  No other NSAIDs.  He has infrequent heartburn which  might occur if he drinks fruit juice.  He denies having any upper or lower abdominal pain.  He had some constipation 1 week ago which resolved after he took milk of magnesia.  He typically passes a normal formed brown bowel movement most days.  No rectal bleeding or black stools.  He recollects of having 2 colonoscopies in his lifetime which she reported were normal.  No known family history of esophageal, gastric or colon cancer.  He is a non-smoker.  No alcohol use.  No chest pain or palpitations.  No cough or shortness of breath.  He is widowed.  [place] his daughter lives in [place].  EGD 02/01/2021 by Dr. 04/03/2021:  Benign-appearing esophageal stenosis. Dilated. - LA Grade C reflux esophagitis with no bleeding. - Erythematous mucosa in the gastric body and antrum. Biopsied. - Medium-sized hiatal hernia. - Duodenal erosions without bleeding. A. STOMACH, ANTRUM AND BODY, BIOPSY:  - Mild chronic gastritis.  - Warthin-Starry is negative for Helicobacter pylori.  - No intestinal metaplasia, dysplasia, or malignancy.   EGD 01/30/2021 by Dr. 04/01/2021 due to a food bolus impaction: 1. Esophageal food impaction status post successful endoscopic removal 2. Underlying distal esophageal ring and hiatal hernia 3. Duodenal erosions status post CLO biopsy. A. STOMACH, ANTRUM AND BODY, BIOPSY:  - Mild chronic gastritis.  - Warthin-Starry is negative for Helicobacter pylori.  - No intestinal metaplasia, dysplasia, or malignancy.   Past Medical History:  Diagnosis Date   Diabetes type 2, controlled (HCC)    High cholesterol  Hypertension    Hypothyroid     Past Surgical History:  Procedure Laterality Date   BALLOON DILATION N/A 01/30/2021   Procedure: BALLOON DILATION;  Surgeon: Hilarie Fredrickson, MD;  Location: [facility] ENDOSCOPY;  Service: Endoscopy;  Laterality: N/A;   BIOPSY  01/30/2021   Procedure: BIOPSY;  Surgeon: Hilarie Fredrickson, MD;  Location: [facility] ENDOSCOPY;   Service: Endoscopy;;   BIOPSY  02/01/2021   Procedure: BIOPSY;  Surgeon: Meryl Dare, MD;  Location: [facility] ENDOSCOPY;  Service: Endoscopy;;   ESOPHAGOGASTRODUODENOSCOPY N/A 02/01/2021   Procedure: ESOPHAGOGASTRODUODENOSCOPY (EGD);  Surgeon: Meryl Dare, MD;  Location: [facility] ENDOSCOPY;  Service: Endoscopy;  Laterality: N/A;   ESOPHAGOGASTRODUODENOSCOPY (EGD) WITH PROPOFOL N/A 01/30/2021   Procedure: ESOPHAGOGASTRODUODENOSCOPY (EGD) WITH PROPOFOL;  Surgeon: Hilarie Fredrickson, MD;  Location: [facility] ENDOSCOPY;  Service: Endoscopy;  Laterality: N/A;   FOREIGN BODY REMOVAL  01/30/2021   Procedure: FOREIGN BODY REMOVAL;  Surgeon: Hilarie Fredrickson, MD;  Location: [facility] ENDOSCOPY;  Service: Endoscopy;;   SAVORY DILATION N/A 02/01/2021   Procedure: Gaspar Bidding DILATION;  Surgeon: Meryl Dare, MD;  Location: [facility] ENDOSCOPY;  Service: Endoscopy;  Laterality: N/A;    Prior to Admission medications   Medication Sig Start Date End Date Taking? Authorizing Provider  acetaminophen (TYLENOL) 325 MG tablet Take 650 mg by mouth 2 (two) times daily as needed for mild pain.    [provider]  acetaminophen (TYLENOL) 500 MG tablet Take 1,000 mg by mouth at bedtime.    [provider]  amLODipine (NORVASC) 2.5 MG tablet Take 2.5 mg by mouth daily.    [provider]  aspirin 81 MG tablet Take 81 mg by mouth daily.    [provider]  Boswellia-Glucosamine-Vit D (OSTEO BI-FLEX ONE PER DAY PO) Take 1 tablet by mouth daily. 1500MG -400MG     [provider]  Carboxymethylcellulose Sodium (REFRESH TEARS OP) Place 2 drops into both eyes 2 (two) times daily as needed (dry eyes).    [provider]  Cholecalciferol (VITAMIN D3) 50 MCG (2000 UT) TABS Take 2,000 Units by mouth daily.    [provider]  docusate sodium (COLACE) 100 MG capsule Take 300 mg by mouth daily.    [provider]  ferrous sulfate 325 (65 FE) MG tablet Take 325 mg by mouth daily with breakfast. Patient  not taking: No sig reported    [provider]  flunisolide (NASALIDE) 25 MCG/ACT (0.025%) SOLN Place 2 sprays into the nose daily.    [provider]  HYDROcodone-acetaminophen (NORCO/VICODIN) 5-325 MG tablet Take 1 tablet by mouth every 6 (six) hours as needed for severe pain. Patient not taking: No sig reported 09/25/20   , PA  ketotifen (ZADITOR) 0.025 % ophthalmic solution Place 1 drop into both eyes 3 (three) times daily.    [provider]  levothyroxine (SYNTHROID, LEVOTHROID) 75 MCG tablet Take 75 mcg by mouth daily before breakfast.    [provider]  loratadine (CLARITIN) 10 MG tablet Take 10 mg by mouth daily.    [provider]  Menthol, Topical Analgesic, (ICY HOT MEDICATED SPRAY EX) Apply 1 application topically in the morning, at noon, in the evening, and at bedtime.    [provider]  metoprolol succinate (TOPROL-XL) 25 MG 24 hr tablet Take 25 mg by mouth daily.    [provider]  Multiple Vitamins-Minerals (CENTRUM SILVER PO) Take 1 tablet by mouth daily.    [provider]  Multiple Vitamins-Minerals (  PRESERVISION AREDS PO) Take 2 capsules by mouth daily.    [provider]  nortriptyline (PAMELOR) 25 MG capsule Take 25 mg by mouth at bedtime.    [provider]  omeprazole (PRILOSEC) 20 MG capsule Take 1 capsule (20 mg total) by mouth daily. 01/30/21   Derwood Kaplan, MD  pantoprazole (PROTONIX) 40 MG tablet Take 1 tablet (40 mg total) by mouth daily. 02/01/21 03/03/21  Eber Hong, MD  simvastatin (ZOCOR) 40 MG tablet Take 20 mg by mouth at bedtime.    [provider]  vitamin C (ASCORBIC ACID) 500 MG tablet Take 500 mg by mouth daily.    [provider]    No current facility-administered medications for this encounter.   Current Outpatient Medications  Medication Sig Dispense Refill   acetaminophen (TYLENOL) 325 MG tablet Take 650 mg by mouth 2 (two)  times daily as needed for mild pain.     acetaminophen (TYLENOL) 500 MG tablet Take 1,000 mg by mouth at bedtime.     amLODipine (NORVASC) 2.5 MG tablet Take 2.5 mg by mouth daily.     aspirin 81 MG tablet Take 81 mg by mouth daily.     Boswellia-Glucosamine-Vit D (OSTEO BI-FLEX ONE PER DAY PO) Take 1 tablet by mouth daily. 1500MG -400MG      Carboxymethylcellulose Sodium (REFRESH TEARS OP) Place 2 drops into both eyes 2 (two) times daily as needed (dry eyes).     Cholecalciferol (VITAMIN D3) 50 MCG (2000 UT) TABS Take 2,000 Units by mouth daily.     docusate sodium (COLACE) 100 MG capsule Take 300 mg by mouth daily.     ferrous sulfate 325 (65 FE) MG tablet Take 325 mg by mouth daily with breakfast. (Patient not taking: No sig reported)     flunisolide (NASALIDE) 25 MCG/ACT (0.025%) SOLN Place 2 sprays into the nose daily.     HYDROcodone-acetaminophen (NORCO/VICODIN) 5-325 MG tablet Take 1 tablet by mouth every 6 (six) hours as needed for severe pain. (Patient not taking: No sig reported) 8 tablet 0   ketotifen (ZADITOR) 0.025 % ophthalmic solution Place 1 drop into both eyes 3 (three) times daily.     levothyroxine (SYNTHROID, LEVOTHROID) 75 MCG tablet Take 75 mcg by mouth daily before breakfast.     loratadine (CLARITIN) 10 MG tablet Take 10 mg by mouth daily.     Menthol, Topical Analgesic, (ICY HOT MEDICATED SPRAY EX) Apply 1 application topically in the morning, at noon, in the evening, and at bedtime.     metoprolol succinate (TOPROL-XL) 25 MG 24 hr tablet Take 25 mg by mouth daily.     Multiple Vitamins-Minerals (CENTRUM SILVER PO) Take 1 tablet by mouth daily.     Multiple Vitamins-Minerals (PRESERVISION AREDS PO) Take 2 capsules by mouth daily.     nortriptyline (PAMELOR) 25 MG capsule Take 25 mg by mouth at bedtime.     omeprazole (PRILOSEC) 20 MG capsule Take 1 capsule (20 mg total) by mouth daily. 30 capsule 1   pantoprazole (PROTONIX) 40 MG tablet Take 1 tablet (40 mg total) by mouth  daily. 30 tablet 0   simvastatin (ZOCOR) 40 MG tablet Take 20 mg by mouth at bedtime.     vitamin C (ASCORBIC ACID) 500 MG tablet Take 500 mg by mouth daily.      Allergies as of 02/07/2021 - Review Complete 02/07/2021  Allergen Reaction Noted   Lisinopril  11/23/2006    Family History: No known family history of esophageal, gastric  or colon cancer.  Social History   Socioeconomic History   Marital status: Widowed    Spouse name: Not on file   Number of children: Not on file   Years of education: Not on file   Highest education level: Not on file  Occupational History   Not on file  Tobacco Use   Smoking status: Unknown   Smokeless tobacco: Not on file  Substance and Sexual Activity   Alcohol use: Not Currently   Drug use: Never   Sexual activity: Not on file  Other Topics Concern   Not on file  Social History Narrative   Not on file   Social Determinants of Health   Financial Resource Strain: Not on file  Food Insecurity: Not on file  Transportation Needs: Not on file  Physical Activity: Not on file  Stress: Not on file  Social Connections: Not on file  Intimate Partner Violence: Not on file    Review of Systems: Gen: Denies fever, sweats or chills. No weight loss.  CV: Denies chest pain, palpitations or edema. Resp: Denies cough, shortness of breath of hemoptysis.  GI: See HPI.   GU : Denies urinary burning, blood in urine, increased urinary frequency or incontinence. MS: Denies joint pain, muscles aches or weakness. Derm: Denies rash, itchiness, skin lesions or unhealing ulcers. Psych: Denies depression, anxiety or memory loss. Heme: Denies easy bruising, bleeding. Neuro:  Denies headaches, dizziness or paresthesias. Endo:  Denies any problems with DM, thyroid or adrenal function.  Physical Exam: Vital signs in last 24 hours: Temp:  [98.3 F (36.8 C)] 98.3 F (36.8 C) (06/13 1054) Pulse Rate:  [61-62] 61 (06/13 1127) Resp:  [16-17] 16 (06/13  1127) BP: (130-136)/(63-79) 136/63 (06/13 1127) SpO2:  [96 %-98 %] 98 % (06/13 1127) Weight:  [73 kg] 73 kg (06/13 1050)   General:  Alert 87 year old male in no acute distress.  Hard of hearing. Head:  Normocephalic and atraumatic. Eyes:  No scleral icterus. Conjunctiva pink. Ears:  Normal auditory acuity. Nose:  No deformity, discharge or lesions. Mouth:  Dentition intact. No ulcers or lesions.  Neck:  Supple. No lymphadenopathy or thyromegaly.  Lungs: Breath sounds clear throughout. Heart: Regular rate and rhythm, no murmurs. Abdomen: Soft, nondistended.  Soft.  Nontender.  Positive bowel sounds all 4 quadrants.  No mass. Rectal: Deferred. Musculoskeletal:  Symmetrical without gross deformities.  Pulses:  Normal pulses noted. Extremities:  Without clubbing or edema. Neurologic:  Alert and  oriented x 4. No focal deficits.  Skin:  Intact without significant lesions or rashes. Psych:  Alert and cooperative. Normal mood and affect.  Intake/Output from previous day: No intake/output data recorded. Intake/Output this shift: No intake/output data recorded.  Lab Results: No results for input(s): WBC, HGB, HCT, PLT in the last 72 hours. BMET No results for input(s): NA, K, CL, CO2, GLUCOSE, BUN, CREATININE, CALCIUM in the last 72 hours. LFT No results for input(s): PROT, ALBUMIN, AST, ALT, ALKPHOS, BILITOT, BILIDIR, IBILI in the last 72 hours. PT/INR No results for input(s): LABPROT, INR in the last 72 hours. Hepatitis Panel No results for input(s): HEPBSAG, HCVAB, HEPAIGM, HEPBIGM in the last 72 hours.    Studies/Results: No results found.  IMPRESSION/PLAN:  82.  87 year old male with recurrent esophageal dysphagia. EGD 6/5 identified a food bolus impaction which was successfully removed with a distal esophageal ring and hiatal hernia.  He was prescribed Omeprazole 20 mg daily.  He had recurrent dysphagia and returned to the ED 6/7  and he underwent a repeat EGD which  identified a benign appearing esophageal stenosis which was dilated, reflux esophagitis, a medium hiatal hernia and gastritis.  Biopsies were negative for H. pylori.  PPI was switched to Pantoprazole 40 mg daily.  He returned to the ED today with recurrent dysphagia. -Recommend hospital admission -NPO -IV fluids per the hospitalist -Pantoprazole 40 mg IV twice daily -Ondansetron 4mg  po or IV Q 6 hrs as needed  -EGD with Dr. Orvan FalconerBeavers 02/08/2021. EGD benefits and risks discussed including risk with sedation, risk of bleeding, perforation and infection    2.  Chronic macrocytic anemia. Past history of B12 deficiency and IDA. On Ferrous Sulfate 325mg  QD.  Hg 9.8 and MCV 107.1 on 02/01/2021.  -CBC, B12 level, iron, iron saturation Ferritin level        Arnaldo NatalColleen M Kennedy-Smith  02/07/2021, 12:01 PM

## 2021-02-07 NOTE — ED Triage Notes (Signed)
Patient BIB [facility] from [facility].  Patient had balloon dilation on Tuesday and a couple days later he once again could not swallow.  Patient now vomiting when he does try to eat.    Vital were 148/84 68-HR 91% on room air 14-RR 98.2-temp

## 2021-02-07 NOTE — ED Notes (Signed)
GI at bedside

## 2021-02-07 NOTE — ED Provider Notes (Signed)
[facility]-EMERGENCY DEPT Provider Note   CSN: [id number] Arrival date & time: 02/07/21  1030     History Chief Complaint  Patient presents with   unable to swallow    Ricky Sandoval is a 87 y.o. male.  HPI    87 year old male with history of diabetes, duodenitis and multiple episodes of food impaction this month comes in a chief complaint of difficulty in swallowing.  He reports that he was having pancakes, and after eating them he just threw up large amount of food material.  When he tried to take medication he threw that up as well.  He had similar symptoms on 6-5 and 6 -7 -on both occasions patient was found to have food impaction.  Past Medical History:  Diagnosis Date   Diabetes type 2, controlled (HCC)    High cholesterol    Hypertension    Hypothyroid     Patient Active Problem List   Diagnosis Date Noted   Dysphagia    Duodenitis with bleeding    Food impaction of esophagus    Duodenal erosion    Esophageal stricture     Past Surgical History:  Procedure Laterality Date   BALLOON DILATION N/A 01/30/2021   Procedure: BALLOON DILATION;  Surgeon: Hilarie Fredrickson, MD;  Location: [facility] ENDOSCOPY;  Service: Endoscopy;  Laterality: N/A;   BIOPSY  01/30/2021   Procedure: BIOPSY;  Surgeon: Hilarie Fredrickson, MD;  Location: [facility] ENDOSCOPY;  Service: Endoscopy;;   BIOPSY  02/01/2021   Procedure: BIOPSY;  Surgeon: Meryl Dare, MD;  Location: [facility] ENDOSCOPY;  Service: Endoscopy;;   ESOPHAGOGASTRODUODENOSCOPY N/A 02/01/2021   Procedure: ESOPHAGOGASTRODUODENOSCOPY (EGD);  Surgeon: Meryl Dare, MD;  Location: [facility] ENDOSCOPY;  Service: Endoscopy;  Laterality: N/A;   ESOPHAGOGASTRODUODENOSCOPY (EGD) WITH PROPOFOL N/A 01/30/2021   Procedure: ESOPHAGOGASTRODUODENOSCOPY (EGD) WITH PROPOFOL;  Surgeon: Hilarie Fredrickson, MD;  Location: [facility] ENDOSCOPY;  Service: Endoscopy;  Laterality: N/A;   FOREIGN BODY REMOVAL  01/30/2021   Procedure: FOREIGN BODY REMOVAL;  Surgeon: Hilarie Fredrickson, MD;  Location: [facility] ENDOSCOPY;  Service: Endoscopy;;   SAVORY DILATION N/A 02/01/2021   Procedure: Gaspar Bidding DILATION;  Surgeon: Meryl Dare, MD;  Location: [facility] ENDOSCOPY;  Service: Endoscopy;  Laterality: N/A;       History reviewed. No pertinent family history.  Social History   Tobacco Use   Smoking status: Unknown  Substance Use Topics   Alcohol use: Not Currently   Drug use: Never    Home Medications Prior to Admission medications   Medication Sig Start Date End Date Taking? Authorizing Provider  acetaminophen (TYLENOL) 325 MG tablet Take 650 mg by mouth 2 (two) times daily as needed for mild pain.   Yes [provider]  acetaminophen (TYLENOL) 500 MG tablet Take 1,000 mg by mouth at bedtime.   Yes [provider]  amLODipine (NORVASC) 2.5 MG tablet Take 2.5 mg by mouth daily.   Yes [provider]  aspirin 81 MG tablet Take 81 mg by mouth daily.   Yes [provider]  Boswellia-Glucosamine-Vit D (OSTEO BI-FLEX ONE PER DAY PO) Take 1 tablet by mouth daily. 1500MG -400MG    Yes [provider]  Carboxymethylcellulose Sodium (REFRESH TEARS OP) Place 2 drops into both eyes 2 (two) times daily as needed (dry eyes).   Yes [provider]  Cholecalciferol (VITAMIN D3) 50 MCG (2000 UT) TABS Take 2,000 Units by mouth daily.   Yes [provider]  docusate sodium (COLACE) 100  MG capsule Take 300 mg by mouth daily.   Yes [provider]  flunisolide (NASALIDE) 25 MCG/ACT (0.025%) SOLN Place 1 spray into the nose daily.   Yes [provider]  ketotifen (ZADITOR) 0.025 % ophthalmic solution Place 1 drop into both eyes 3 (three) times daily.   Yes [provider]  levothyroxine (SYNTHROID, LEVOTHROID) 75 MCG tablet Take 75 mcg by mouth daily before breakfast.   Yes [provider]  loratadine (CLARITIN) 10 MG tablet Take 10 mg by mouth daily.   Yes [provider]  Menthol, Topical  Analgesic, (ICY HOT MEDICATED SPRAY EX) Apply 1 application topically in the morning, at noon, in the evening, and at bedtime.   Yes [provider]  metoprolol succinate (TOPROL-XL) 25 MG 24 hr tablet Take 25 mg by mouth daily.   Yes [provider]  Multiple Vitamins-Minerals (CENTRUM SILVER PO) Take 1 tablet by mouth daily.   Yes [provider]  Multiple Vitamins-Minerals (PRESERVISION AREDS PO) Take 2 capsules by mouth daily.   Yes [provider]  nortriptyline (PAMELOR) 25 MG capsule Take 25 mg by mouth at bedtime.   Yes [provider]  omeprazole (PRILOSEC) 20 MG capsule Take 1 capsule (20 mg total) by mouth daily. 01/30/21  Yes Derwood Kaplan, MD  simvastatin (ZOCOR) 40 MG tablet Take 20 mg by mouth at bedtime.   Yes [provider]  vitamin C (ASCORBIC ACID) 500 MG tablet Take 500 mg by mouth daily.   Yes [provider]  HYDROcodone-acetaminophen (NORCO/VICODIN) 5-325 MG tablet Take 1 tablet by mouth every 6 (six) hours as needed for severe pain. Patient not taking: No sig reported 09/25/20   Gailen Shelter, PA  pantoprazole (PROTONIX) 40 MG tablet Take 1 tablet (40 mg total) by mouth daily. Patient not taking: Reported on 02/07/2021 02/01/21 03/03/21  Eber Hong, MD    Allergies    Lisinopril  Review of Systems   Review of Systems  Constitutional:  Positive for activity change.  Respiratory:  Negative for shortness of breath.   Cardiovascular:  Negative for chest pain.  Gastrointestinal:  Positive for abdominal pain, nausea and vomiting.  Hematological:  Does not bruise/bleed easily.  All other systems reviewed and are negative.  Physical Exam Updated Vital Signs BP (!) 191/80   Pulse 73   Temp 98.3 F (36.8 C) (Oral)   Resp 17   Ht 5\' 5"  (1.651 m)   Wt 73 kg   SpO2 95%   BMI 26.78 kg/m   Physical Exam Vitals and nursing note reviewed.  Constitutional:      Appearance: He is well-developed.  HENT:      Head: Atraumatic.  Cardiovascular:     Rate and Rhythm: Normal rate.  Pulmonary:     Effort: Pulmonary effort is normal.  Abdominal:     General: There is no distension.  Musculoskeletal:     Cervical back: Neck supple.  Skin:    General: Skin is warm.  Neurological:     Mental Status: He is alert and oriented to person, place, and time.    ED Results / Procedures / Treatments   Labs (all labs ordered are listed, but only abnormal results are displayed) Labs Reviewed  CBC  COMPREHENSIVE METABOLIC PANEL  VITAMIN B12  INTRINSIC FACTOR ANTIBODIES  IRON AND TIBC  FERRITIN  FOLATE  POC SARS CORONAVIRUS 2 AG -  ED    EKG None  Radiology No results found.  Procedures  Procedures   Medications Ordered in ED Medications  pantoprazole (PROTONIX) injection 40 mg (has no administration in time range)  dextrose 5 % in lactated ringers infusion (has no administration in time range)    ED Course  I have reviewed the triage vital signs and the nursing notes.  Pertinent labs & imaging results that were available during my care of the patient were reviewed by me and considered in my medical decision making (see chart for details).  Clinical Course as of 02/07/21 1416  Mon Feb 07, 2021  1415 GI team recommends admission to the hospital.  Maintenance fluid ordered.  Labs ordered. [AN]    Clinical Course User Index [AN] Derwood Kaplan, MD   MDM Rules/Calculators/A&P                          87 year old male comes in w/ chief complaint of difficulty in swallowing. He has known history of esophageal food impaction, and it appears that his symptoms are consistent with that again.  Spoke with low-power GI, they will see the patient.  Final Clinical Impression(s) / ED Diagnoses Final diagnoses:  Food impaction of esophagus, initial encounter    Rx / DC Orders ED Discharge Orders     None        Derwood Kaplan, MD 02/07/21 1416

## 2021-02-07 NOTE — Progress Notes (Signed)
Brief note. Mr. Jeansonne asked that I call his son to let him know our plan. I called his son, Cecil Vandyke, I informed him that his father will be admitted to [facility] with plans for a repeat EGD tomorrow and eventual barium swallow to further evaluate his recurrent dysphagia. His son appreciated the call and he will share this info with his siblings.

## 2021-02-07 NOTE — H&P (Signed)
History and Physical    OHN BOSTIC FAO:[id number] DOB: [birth year] DOA: 02/07/2021  PCP: Chilton Greathouse, MD  Patient coming from: [facility]  Chief Complaint: dysphagia  HPI: Ricky Sandoval is a 87 y.o. male with medical history significant of dysphagia, HTN, hypothyroidism. Presenting with difficulty swallowing. He reports he's had episodes of dysphagia off and on for weeks. He's been followed by [facility] with a recent EGD. He reports that this morning he was trying to eat breakfast. He was able to eat his eggs. However when he tried his pancake, it got stuck in his throat. He felt as if he was choking, so he coughed it up along with the eggs and some water. He was able to calm himself. However, the facility became concerned and convinced him to go to the ED for evaluation. He denies any other aggravating or alleviating factors.   ED Course: GI was consulted. They recommended repeat EGD for the morning. [facility] was called for admission.   Review of Systems:  Review of systems is otherwise negative for all not mentioned in HPI.   PMHx Past Medical History:  Diagnosis Date   Diabetes type 2, controlled (HCC)    High cholesterol    Hypertension    Hypothyroid     PSHx Past Surgical History:  Procedure Laterality Date   BALLOON DILATION N/A 01/30/2021   Procedure: BALLOON DILATION;  Surgeon: Hilarie Fredrickson, MD;  Location: [facility] ENDOSCOPY;  Service: Endoscopy;  Laterality: N/A;   BIOPSY  01/30/2021   Procedure: BIOPSY;  Surgeon: Hilarie Fredrickson, MD;  Location: [facility] ENDOSCOPY;  Service: Endoscopy;;   BIOPSY  02/01/2021   Procedure: BIOPSY;  Surgeon: Meryl Dare, MD;  Location: [facility] ENDOSCOPY;  Service: Endoscopy;;   ESOPHAGOGASTRODUODENOSCOPY N/A 02/01/2021   Procedure: ESOPHAGOGASTRODUODENOSCOPY (EGD);  Surgeon: Meryl Dare, MD;  Location: [facility] ENDOSCOPY;  Service: Endoscopy;  Laterality: N/A;   ESOPHAGOGASTRODUODENOSCOPY (EGD) WITH PROPOFOL N/A 01/30/2021   Procedure:  ESOPHAGOGASTRODUODENOSCOPY (EGD) WITH PROPOFOL;  Surgeon: Hilarie Fredrickson, MD;  Location: [facility] ENDOSCOPY;  Service: Endoscopy;  Laterality: N/A;   FOREIGN BODY REMOVAL  01/30/2021   Procedure: FOREIGN BODY REMOVAL;  Surgeon: Hilarie Fredrickson, MD;  Location: [facility] ENDOSCOPY;  Service: Endoscopy;;   SAVORY DILATION N/A 02/01/2021   Procedure: Gaspar Bidding DILATION;  Surgeon: Meryl Dare, MD;  Location: [facility] ENDOSCOPY;  Service: Endoscopy;  Laterality: N/A;    SocHx  reports that he has never smoked. He has never used smokeless tobacco. He reports previous alcohol use. He reports that he does not use drugs.  Allergies  Allergen Reactions   Lisinopril     Other reaction(s): Airway constriction    FamHx History reviewed. No pertinent family history.  Prior to Admission medications   Medication Sig Start Date End Date Taking? Authorizing Provider  acetaminophen (TYLENOL) 325 MG tablet Take 650 mg by mouth 2 (two) times daily as needed for mild pain.   Yes [provider]  acetaminophen (TYLENOL) 500 MG tablet Take 1,000 mg by mouth at bedtime.   Yes [provider]  amLODipine (NORVASC) 2.5 MG tablet Take 2.5 mg by mouth daily.   Yes [provider]  aspirin 81 MG tablet Take 81 mg by mouth daily.   Yes [provider]  Boswellia-Glucosamine-Vit D (OSTEO BI-FLEX ONE PER DAY PO) Take 1 tablet by mouth daily. 1500MG -400MG    Yes [provider]  Carboxymethylcellulose Sodium (REFRESH TEARS OP) Place 2 drops into both eyes 2 (two) times  daily as needed (dry eyes).   Yes [provider]  Cholecalciferol (VITAMIN D3) 50 MCG (2000 UT) TABS Take 2,000 Units by mouth daily.   Yes [provider]  docusate sodium (COLACE) 100 MG capsule Take 300 mg by mouth daily.   Yes [provider]  flunisolide (NASALIDE) 25 MCG/ACT (0.025%) SOLN Place 1 spray into the nose daily.   Yes [provider]  ketotifen (ZADITOR) 0.025 % ophthalmic solution  Place 1 drop into both eyes 3 (three) times daily.   Yes [provider]  levothyroxine (SYNTHROID, LEVOTHROID) 75 MCG tablet Take 75 mcg by mouth daily before breakfast.   Yes [provider]  loratadine (CLARITIN) 10 MG tablet Take 10 mg by mouth daily.   Yes [provider]  Menthol, Topical Analgesic, (ICY HOT MEDICATED SPRAY EX) Apply 1 application topically in the morning, at noon, in the evening, and at bedtime.   Yes [provider]  metoprolol succinate (TOPROL-XL) 25 MG 24 hr tablet Take 25 mg by mouth daily.   Yes [provider]  Multiple Vitamins-Minerals (CENTRUM SILVER PO) Take 1 tablet by mouth daily.   Yes [provider]  Multiple Vitamins-Minerals (PRESERVISION AREDS PO) Take 2 capsules by mouth daily.   Yes [provider]  nortriptyline (PAMELOR) 25 MG capsule Take 25 mg by mouth at bedtime.   Yes [provider]  omeprazole (PRILOSEC) 20 MG capsule Take 1 capsule (20 mg total) by mouth daily. 01/30/21  Yes Derwood Kaplan, MD  simvastatin (ZOCOR) 40 MG tablet Take 20 mg by mouth at bedtime.   Yes [provider]  vitamin C (ASCORBIC ACID) 500 MG tablet Take 500 mg by mouth daily.   Yes [provider]  HYDROcodone-acetaminophen (NORCO/VICODIN) 5-325 MG tablet Take 1 tablet by mouth every 6 (six) hours as needed for severe pain. Patient not taking: No sig reported 09/25/20   Gailen Shelter, PA  pantoprazole (PROTONIX) 40 MG tablet Take 1 tablet (40 mg total) by mouth daily. Patient not taking: Reported on 02/07/2021 02/01/21 03/03/21  Eber Hong, MD    Physical Exam: Vitals:   02/07/21 1428 02/07/21 1430 02/07/21 1500 02/07/21 1530  BP: (!) 168/67 (!) 173/67 (!) 197/75 (!) 180/69  Pulse: 65 66 71 66  Resp: 16 15 16 16   Temp:      TempSrc:      SpO2: 94% 94% 96% 96%  Weight:      Height:        General: 87 y.o. male resting in bed in NAD Eyes: PERRL, normal sclera ENMT: Nares  patent w/o discharge, orophaynx clear, dentition normal, ears w/o discharge/lesions/ulcers Neck: Supple, trachea midline Cardiovascular: RRR, +S1, S2, no m/g/r, equal pulses throughout Respiratory: CTABL, no w/r/r, normal WOB GI: BS+, NDNT, no masses noted, no organomegaly noted MSK: No e/c/c Skin: No rashes, bruises, ulcerations noted Neuro: A&O x 3, no focal deficits Psyc: Appropriate interaction and affect, calm/cooperative  Labs on Admission: I have personally reviewed following labs and imaging studies  CBC: Recent Labs  Lab 02/01/21 1045 02/07/21 1350  WBC 5.8 6.4  NEUTROABS 3.5  --   HGB 9.8* 10.3*  HCT 30.0* 30.8*  MCV 107.1* 104.8*  PLT 179 198   Basic Metabolic Panel: Recent Labs  Lab 02/01/21 1045 02/07/21 1350  NA 139 139  K 4.5 4.3  CL 105 105  CO2 24 27  GLUCOSE 104* 83  BUN 46* 29*  CREATININE 1.68* 1.29*  CALCIUM 9.0  9.1   GFR: Estimated Creatinine Clearance: 29.1 mL/min (A) (by C-G formula based on SCr of 1.29 mg/dL (H)). Liver Function Tests: Recent Labs  Lab 02/07/21 1350  AST 31  ALT 28  ALKPHOS 60  BILITOT 0.4  PROT 6.8  ALBUMIN 3.9   No results for input(s): LIPASE, AMYLASE in the last 168 hours. No results for input(s): AMMONIA in the last 168 hours. Coagulation Profile: No results for input(s): INR, PROTIME in the last 168 hours. Cardiac Enzymes: No results for input(s): CKTOTAL, CKMB, CKMBINDEX, TROPONINI in the last 168 hours. BNP (last 3 results) No results for input(s): PROBNP in the last 8760 hours. HbA1C: No results for input(s): HGBA1C in the last 72 hours. CBG: No results for input(s): GLUCAP in the last 168 hours. Lipid Profile: No results for input(s): CHOL, HDL, LDLCALC, TRIG, CHOLHDL, LDLDIRECT in the last 72 hours. Thyroid Function Tests: No results for input(s): TSH, T4TOTAL, FREET4, T3FREE, THYROIDAB in the last 72 hours. Anemia Panel: Recent Labs    02/07/21 1350  VITAMINB12 677  FERRITIN 1,243*  TIBC 262   IRON 42*   Urine analysis:    Component Value Date/Time   COLORURINE YELLOW 11/13/2007 1049   APPEARANCEUR CLEAR 11/13/2007 1049   LABSPEC 1.019 11/13/2007 1049   PHURINE 7.5 11/13/2007 1049   GLUCOSEU NEGATIVE 11/13/2007 1049   HGBUR NEGATIVE 11/13/2007 1049   BILIRUBINUR NEGATIVE 11/13/2007 1049   KETONESUR NEGATIVE 11/13/2007 1049   PROTEINUR NEGATIVE 11/13/2007 1049   UROBILINOGEN 0.2 11/13/2007 1049   NITRITE NEGATIVE 11/13/2007 1049   LEUKOCYTESUR  11/13/2007 1049    NEGATIVE MICROSCOPIC NOT DONE ON URINES WITH NEGATIVE PROTEIN, BLOOD, LEUKOCYTES, NITRITE, OR GLUCOSE <1000 mg/dL.    Radiological Exams on Admission: No results found.  EKG: None obtained in ED.  Assessment/Plan Dysphagia/?food impaction     - place in obs, tele     - [facility] to take for EGD tomorrow     - protonix, zofran, fluids     - NPO  HTN     - resume home regimen when he can take PO     - will have PRN metoprolol available  Hypothyroidism     - resume home synthroid when he can take PO  HLD     - resume home statin when he can take PO  CKD3a     - at baseline, watch for nephrotoxins  DVT prophylaxis: SCDs  Code Status: FULL  Family Communication: None at bedside  Consults called: [facility]   Status is: Observation  The patient remains OBS appropriate and will d/c before 2 midnights.  Dispo: The patient is from: ALF              Anticipated d/c is to: ALF              Patient currently is not medically stable to d/c.   Difficult to place patient No   Time spent coordinating admission: 30 minutes  Menelik Mcfarren A Alexande Sheerin DO [facility]  If 7PM-7AM, please contact night-coverage [email or web address]  02/07/2021, 5:07 PM

## 2021-02-08 ENCOUNTER — Observation Stay (HOSPITAL_COMMUNITY): Payer: Medicare Other | Admitting: Anesthesiology

## 2021-02-08 ENCOUNTER — Encounter (HOSPITAL_COMMUNITY)
Admission: EM | Disposition: A | Discharge status: Skilled Nursing Facility | Payer: Self-pay | Source: Skilled Nursing Facility | Attending: Internal Medicine

## 2021-02-08 ENCOUNTER — Encounter (HOSPITAL_COMMUNITY): Payer: Self-pay | Admitting: Internal Medicine

## 2021-02-08 DIAGNOSIS — K2950 Unspecified chronic gastritis without bleeding: Secondary | ICD-10-CM | POA: Diagnosis present

## 2021-02-08 DIAGNOSIS — N1831 Chronic kidney disease, stage 3a: Secondary | ICD-10-CM | POA: Diagnosis present

## 2021-02-08 DIAGNOSIS — R1314 Dysphagia, pharyngoesophageal phase: Secondary | ICD-10-CM | POA: Diagnosis present

## 2021-02-08 DIAGNOSIS — Z79899 Other long term (current) drug therapy: Secondary | ICD-10-CM | POA: Diagnosis not present

## 2021-02-08 DIAGNOSIS — E039 Hypothyroidism, unspecified: Secondary | ICD-10-CM | POA: Diagnosis present

## 2021-02-08 DIAGNOSIS — Z20822 Contact with and (suspected) exposure to covid-19: Secondary | ICD-10-CM | POA: Diagnosis present

## 2021-02-08 DIAGNOSIS — E785 Hyperlipidemia, unspecified: Secondary | ICD-10-CM | POA: Diagnosis present

## 2021-02-08 DIAGNOSIS — K2970 Gastritis, unspecified, without bleeding: Secondary | ICD-10-CM | POA: Diagnosis not present

## 2021-02-08 DIAGNOSIS — T18128A Food in esophagus causing other injury, initial encounter: Secondary | ICD-10-CM

## 2021-02-08 DIAGNOSIS — K2210 Ulcer of esophagus without bleeding: Secondary | ICD-10-CM

## 2021-02-08 DIAGNOSIS — Z7982 Long term (current) use of aspirin: Secondary | ICD-10-CM | POA: Diagnosis not present

## 2021-02-08 DIAGNOSIS — D539 Nutritional anemia, unspecified: Secondary | ICD-10-CM | POA: Diagnosis present

## 2021-02-08 DIAGNOSIS — E538 Deficiency of other specified B group vitamins: Secondary | ICD-10-CM | POA: Diagnosis present

## 2021-02-08 DIAGNOSIS — E1122 Type 2 diabetes mellitus with diabetic chronic kidney disease: Secondary | ICD-10-CM | POA: Diagnosis present

## 2021-02-08 DIAGNOSIS — I129 Hypertensive chronic kidney disease with stage 1 through stage 4 chronic kidney disease, or unspecified chronic kidney disease: Secondary | ICD-10-CM | POA: Diagnosis present

## 2021-02-08 DIAGNOSIS — E7800 Pure hypercholesterolemia, unspecified: Secondary | ICD-10-CM | POA: Diagnosis present

## 2021-02-08 DIAGNOSIS — Z888 Allergy status to other drugs, medicaments and biological substances status: Secondary | ICD-10-CM | POA: Diagnosis not present

## 2021-02-08 DIAGNOSIS — R1319 Other dysphagia: Secondary | ICD-10-CM | POA: Diagnosis not present

## 2021-02-08 DIAGNOSIS — R1310 Dysphagia, unspecified: Secondary | ICD-10-CM | POA: Diagnosis not present

## 2021-02-08 DIAGNOSIS — K2990 Gastroduodenitis, unspecified, without bleeding: Secondary | ICD-10-CM | POA: Diagnosis present

## 2021-02-08 DIAGNOSIS — R262 Difficulty in walking, not elsewhere classified: Secondary | ICD-10-CM | POA: Diagnosis present

## 2021-02-08 DIAGNOSIS — Z79890 Hormone replacement therapy (postmenopausal): Secondary | ICD-10-CM | POA: Diagnosis not present

## 2021-02-08 DIAGNOSIS — K222 Esophageal obstruction: Secondary | ICD-10-CM | POA: Diagnosis present

## 2021-02-08 DIAGNOSIS — K449 Diaphragmatic hernia without obstruction or gangrene: Secondary | ICD-10-CM | POA: Diagnosis present

## 2021-02-08 HISTORY — PX: ESOPHAGOGASTRODUODENOSCOPY (EGD) WITH PROPOFOL: SHX5813

## 2021-02-08 HISTORY — PX: BIOPSY: SHX5522

## 2021-02-08 LAB — BASIC METABOLIC PANEL
Anion gap: 6 (ref 5–15)
BUN: 23 mg/dL (ref 8–23)
CO2: 28 mmol/L (ref 22–32)
Calcium: 8.9 mg/dL (ref 8.9–10.3)
Chloride: 105 mmol/L (ref 98–111)
Creatinine, Ser: 1.18 mg/dL (ref 0.61–1.24)
GFR, Estimated: 56 mL/min — ABNORMAL LOW (ref >60)
Glucose, Bld: 100 mg/dL — ABNORMAL HIGH (ref 70–99)
Potassium: 4.1 mmol/L (ref 3.5–5.1)
Sodium: 139 mmol/L (ref 135–145)

## 2021-02-08 LAB — HEMOGLOBIN AND HEMATOCRIT, BLOOD
HCT: 29.6 % — ABNORMAL LOW (ref 39.0–52.0)
Hemoglobin: 9.9 g/dL — ABNORMAL LOW (ref 13.0–17.0)

## 2021-02-08 LAB — INTRINSIC FACTOR ANTIBODIES: Intrinsic Factor: 1.1 AU/mL (ref 0.0–1.1)

## 2021-02-08 SURGERY — ESOPHAGOGASTRODUODENOSCOPY (EGD) WITH PROPOFOL
Anesthesia: Monitor Anesthesia Care

## 2021-02-08 MED ORDER — LIDOCAINE 2% (20 MG/ML) 5 ML SYRINGE
INTRAMUSCULAR | Status: DC | PRN
  Administered 2021-02-08: 100 mg via INTRAVENOUS

## 2021-02-08 MED ORDER — PROPOFOL 500 MG/50ML IV EMUL
INTRAVENOUS | Status: DC | PRN
  Administered 2021-02-08 (×4): 10 mg via INTRAVENOUS

## 2021-02-08 MED ORDER — PHENYLEPHRINE 40 MCG/ML (10ML) SYRINGE FOR IV PUSH (FOR BLOOD PRESSURE SUPPORT)
PREFILLED_SYRINGE | INTRAVENOUS | Status: DC | PRN
  Administered 2021-02-08 (×3): 80 ug via INTRAVENOUS

## 2021-02-08 MED ORDER — HYDRALAZINE HCL 20 MG/ML IJ SOLN
10.0000 mg | Freq: Four times a day (QID) | INTRAMUSCULAR | Status: DC | PRN
  Administered 2021-02-08 (×2): 10 mg via INTRAVENOUS
  Filled 2021-02-08 (×2): qty 1

## 2021-02-08 MED ORDER — PROPOFOL 500 MG/50ML IV EMUL
INTRAVENOUS | Status: DC | PRN
  Administered 2021-02-08: 100 ug/kg/min via INTRAVENOUS

## 2021-02-08 MED ORDER — LACTATED RINGERS IV SOLN
INTRAVENOUS | Status: AC | PRN
  Administered 2021-02-08: 1000 mL via INTRAVENOUS

## 2021-02-08 SURGICAL SUPPLY — 15 items

## 2021-02-08 NOTE — Progress Notes (Signed)
     [facility] Progress Note  CC:  Recurrent dysphagia, esophageal stenosis   Subjective: He is tired, he did not sleep well in the ED last night. He remains NPO for EGD this afternoon. No N/V. No abdominal pain. No BM. No family at the bedside.   Objective:  Vital signs in last 24 hours: Temp:  [98.2 F (36.8 C)-98.6 F (37 C)] 98.6 F (37 C) (06/14 1109) Pulse Rate:  [59-77] 65 (06/14 1109) Resp:  [12-24] 18 (06/14 1109) BP: (138-197)/(53-97) 168/82 (06/14 1109) SpO2:  [92 %-99 %] 96 % (06/14 1109)   General: Alert fatigued appearing male in NAD.  Heart: RRR, no murmur.  Pulm:  Breath sounds clear throughout.  Abdomen: Soft, nondistended. Nontender.  Extremities:  Without edema. Neurologic:  Alert and  oriented x 4;  Grossly normal neurologically. Psych:  Alert and cooperative. Normal mood and affect.  Intake/Output from previous day: 06/13 0701 - 06/14 0700 In: 1000 [I.V.:1000] Out: 700 [Urine:700] Intake/Output this shift: No intake/output data recorded.  Lab Results: Recent Labs    02/07/21 1350 02/08/21 0435  WBC 6.4  --   HGB 10.3* 9.9*  HCT 30.8* 29.6*  PLT 198  --    BMET Recent Labs    02/07/21 1350 02/08/21 0435  NA 139 139  K 4.3 4.1  CL 105 105  CO2 27 28  GLUCOSE 83 100*  BUN 29* 23  CREATININE 1.29* 1.18  CALCIUM 9.1 8.9   LFT Recent Labs    02/07/21 1350  PROT 6.8  ALBUMIN 3.9  AST 31  ALT 28  ALKPHOS 60  BILITOT 0.4   PT/INR No results for input(s): LABPROT, INR in the last 72 hours. Hepatitis Panel No results for input(s): HEPBSAG, HCVAB, HEPAIGM, HEPBIGM in the last 72 hours.  No results found.  Assessment / Plan:  45.  87 year old male with admitted to the hospital 02/07/2021 with recurrent esophageal dysphagia. EGD 6/5 identified a food bolus impaction which was successfully removed with a distal esophageal ring and hiatal hernia.  He was prescribed Omeprazole 20 mg daily.  He had recurrent dysphagia and  returned to the ED 6/7 and he underwent a repeat EGD which identified a benign appearing esophageal stenosis which was dilated, reflux esophagitis, a medium hiatal hernia and gastritis.  Biopsies were negative for H. pylori. Plan for repeat EGD today at 1:30pm.  -Recommend hospital admission -NPO, received IV fluids which were discontinued this am. Nursing staff notified to restart IV fluids, defer further IV fluid management the hospitalist  -Pantoprazole 40 mg IV twice daily -Ondansetron 4mg  po or IV Q 6 hrs as needed -Proceed with EGD this afternoon as scheduled with Dr. -Further recommendations to be determined after EGD completed     2.  Chronic macrocytic anemia. Past history of B12 deficiency and IDA. On Ferrous Sulfate 325mg  QD.  HG 10.3 -> 9.9. Iron 42. Ferritin 1,243. B12 677.    Principal Problem:   Dysphagia     LOS: 0 days   Orvan Falconer  02/08/2021, 11:27 AM

## 2021-02-08 NOTE — Op Note (Signed)
[facility] Patient Name: Ricky Sandoval Procedure Date: 02/08/2021 MRN: [id number] Attending MD: Tressia Danas MD, MD Date of Birth: 1922-04-24 CSN: [id number] Age: 87 Admit Type: Inpatient Procedure:                Upper GI endoscopy Indications:              Dysphagia with concerns for recurrent food impaction Providers:                Tressia Danas MD, MD, Norman Clay, RN, Michele Mcalpine Technician Referring MD:              Medicines:                Monitored Anesthesia Care Complications:            No immediate complications. Estimated blood loss:                            Minimal. Estimated Blood Loss:     Estimated blood loss was minimal. Procedure:                Pre-Anesthesia Assessment:                           - Prior to the procedure, a History and Physical                            was performed, and patient medications and                            allergies were reviewed. The patient's tolerance of                            previous anesthesia was also reviewed. The risks                            and benefits of the procedure and the sedation                            options and risks were discussed with the patient.                            All questions were answered, and informed consent                            was obtained. Prior Anticoagulants: The patient has                            taken no previous anticoagulant or antiplatelet                            agents. ASA Grade Assessment: III - A patient with  severe systemic disease. After reviewing the risks                            and benefits, the patient was deemed in                            satisfactory condition to undergo the procedure.                           After obtaining informed consent, the endoscope was                            passed under direct vision. Throughout the                             procedure, the patient's blood pressure, pulse, and                            oxygen saturations were monitored continuously. The                            GIF-H190 ([id number]) Olympus gastroscope was                            introduced through the mouth, and advanced to the                            third part of duodenum. The upper GI endoscopy was                            accomplished without difficulty. The patient                            tolerated the procedure well. Scope In: Scope Out: Findings:      One linear, 3cm esophageal ulcer with no bleeding and no stigmata of       recent bleeding was found 30 cm from the incisors. Biopsies were taken       with a cold forceps for histology. Estimated blood loss was minimal.      The lumen of the esophagus is tortuous.      One benign-appearing, intrinsic mild (non-circumferential scarring)       stricture was found 35 cm from the incisors. The stricture was easily       traversed with no resistance.      Mild inflammation was found in the gastric body and in the gastric       antrum. The areas of inflammation in the gastric body were very       discrete, likely due to prior gastric biopsies. The inflammation at the       antrum is diffuse. Biopsies were taken from the antrum, body, and fundus       with a cold forceps for histology. Estimated blood loss was minimal.      A medium-sized hiatal hernia was present. No Cameron's ulcers.      Diffuse moderately erythematous mucosa without active bleeding and with  no stigmata of bleeding was found in the duodenal bulb. Biopsies were       taken with a cold forceps for histology. Estimated blood loss was       minimal.      The cardia and gastric fundus were normal on retroflexion. Impression:               - Esophageal ulcer with no bleeding and no stigmata                            of recent bleeding. Biopsied. This is stable if not                            improved compared  to endoscopy images from earlier                            this month.                           - Tortuous esophagus, consistent with                            presbyesophagus.                           - Benign-appearing, widely patent esophageal                            stricture. No obstruction or food impaction seen                            today.                           - Gastritis. Biopsied.                           - Medium-sized hiatal hernia.                           - Erythematous duodenopathy. Biopsied.                           - Dysphagia may be due to esophageal ulcer and                            presbyesophagus. Must consider underlying                            dysmotility. Moderate Sedation:      Not Applicable - Patient had care per Anesthesia. Recommendation:           - Return patient to hospital ward for ongoing care.                           - Advance diet as tolerated.                           -  Continue present medications including                            pantoprazole 40 mg BID. Add Carafate slurry 1g QID                            x 2 weeks.                           - Eat slowly and upright. Take small bites. Chew                            carefully. Take sips of water between bites.                           - Await pathology results.                           - Proceed with barium esophagram as an outpatient.                           - Outpatient follow-up with Dr. Marina Goodell to consider                            need for additional evaluation including esophageal                            manometry. Procedure Code(s):        --- Professional ---                           [phone], Esophagogastroduodenoscopy, flexible,                            transoral; with biopsy, single or multiple Diagnosis Code(s):        --- Professional ---                           K22.10, Ulcer of esophagus without bleeding                           K22.8, Other  specified diseases of esophagus                           K22.2, Esophageal obstruction                           K29.70, Gastritis, unspecified, without bleeding                           K44.9, Diaphragmatic hernia without obstruction or                            gangrene                           K31.89, Other diseases of stomach  and duodenum                           R13.10, Dysphagia, unspecified CPT copyright 2019 American Medical Association. All rights reserved. The codes documented in this report are preliminary and upon coder review may  be revised to meet current compliance requirements. Tressia Danas MD, MD 02/08/2021 3:39:20 PM This report has been signed electronically. Number of Addenda: 0

## 2021-02-08 NOTE — Progress Notes (Signed)
Received verbal report from [place], RN in ED.

## 2021-02-08 NOTE — ED Notes (Signed)
Pts purewick was not functioning correctly and had leaked all over the bed. Pt was given a bath and linen change.

## 2021-02-08 NOTE — ED Notes (Signed)
Dr. Tyson Babinski paged due to pt BP.

## 2021-02-08 NOTE — Anesthesia Postprocedure Evaluation (Signed)
Anesthesia Post Note  Patient: Ricky Sandoval  Procedure(s) Performed: ESOPHAGOGASTRODUODENOSCOPY (EGD) WITH PROPOFOL     Patient location during evaluation: Endoscopy Anesthesia Type: MAC Level of consciousness: awake and alert Pain management: pain level controlled Vital Signs Assessment: post-procedure vital signs reviewed and stable Respiratory status: spontaneous breathing, nonlabored ventilation, respiratory function stable and patient connected to nasal cannula oxygen Cardiovascular status: blood pressure returned to baseline and stable Postop Assessment: no apparent nausea or vomiting Anesthetic complications: no   No notable events documented.  Last Vitals:  Vitals:   02/08/21 1532 02/08/21 1700  BP: (!) 108/37 (!) 167/69  Pulse: 70 76  Resp: 19 18  Temp:  36.8 C  SpO2: 94% 94%    Last Pain:  Vitals:   02/08/21 1700  TempSrc: Oral  PainSc: 0-No pain                 Barnet Glasgow

## 2021-02-08 NOTE — Transfer of Care (Signed)
Immediate Anesthesia Transfer of Care Note  Patient: Ricky Sandoval  Procedure(s) Performed: ESOPHAGOGASTRODUODENOSCOPY (EGD) WITH PROPOFOL  Patient Location: Endoscopy Unit  Anesthesia Type:MAC  Level of Consciousness: awake, alert , oriented, drowsy and patient cooperative  Airway & Oxygen Therapy: Patient Spontanous Breathing and Patient connected to face mask oxygen  Post-op Assessment: Report given to RN and Post -op Vital signs reviewed and stable  Post vital signs: Reviewed and stable  Last Vitals:  Vitals Value Taken Time  BP 106/40 02/08/21 1525  Temp    Pulse 69 02/08/21 1526  Resp 25 02/08/21 1526  SpO2 97% 02/08/21 1526  Vitals shown include unvalidated device data.  Last Pain:  Vitals:   02/08/21 1523  TempSrc:   PainSc: Asleep         Complications: No notable events documented.

## 2021-02-08 NOTE — Progress Notes (Signed)
PROGRESS NOTE  Ricky Sandoval YTK:[id number] DOB: [birth year] DOA: 02/07/2021 PCP: Chilton Greathouse, MD   LOS: 0 days   Brief narrative:  Ricky Sandoval is a 87 y.o. male with medical history significant of dysphagia, HTN, hypothyroidism presented to the hospital with difficulty swallowing on and off for weeks.  Patient has been seen by [facility] and had a history of dilatation in the past.  At this time patient is awaiting for endoscopic evaluation and dilatation.    Assessment/Plan:  Principal Problem:   Dysphagia  Dysphagia with food impaction.    Currently n.p.o. for endoscopic intervention, continue Protonix Zofran IV fluids.   Essential HTN Accelerated blood pressure.  On IV metoprolol.  We will add IV hydralazine as well.  Currently n.p.o. and has not received his oral medication.   Hypothyroidism     - resume home synthroid when he can take PO   Hyperlipidemia     - resume home statin when he can take PO   CKD3a    currently at baseline.  Follow BMP.    DVT prophylaxis: SCDs Start: 02/07/21 2139   Code Status: Full code  Family Communication: None  Status is: Observation  The patient will require care spanning > 2 midnights and should be moved to inpatient because: Ongoing diagnostic testing needed not appropriate for outpatient work up and IV treatments appropriate due to intensity of illness or inability to take PO  Dispo: The patient is from: Home              Anticipated d/c is to: Home              Patient currently is not medically stable to d/c.   Difficult to place patient No    Consultants: GI  Procedures: None yet  Anti-infectives:  None  Anti-infectives (From admission, onward)    None      Subjective: Today, patient was seen and examined at bedside.  Awaiting for endoscopic evaluation.  Objective: Vitals:   02/08/21 1109 02/08/21 1300  BP:  (!) 197/81  Pulse:  66  Resp:  16  Temp: 98.6 F (37 C)   SpO2:  95%     Intake/Output Summary (Last 24 hours) at 02/08/2021 1332 Last data filed at 02/08/2021 0528 Gross per 24 hour  Intake 1000 ml  Output 700 ml  Net 300 ml   Filed Weights   02/07/21 1050  Weight: 73 kg   Body mass index is 26.78 kg/m.   Physical Exam: GENERAL: Patient is alert awake and oriented. Not in obvious distress. HENT: No scleral pallor or icterus. Pupils equally reactive to light. Oral mucosa is moist NECK: is supple, no gross swelling noted. CHEST: Clear to auscultation. No crackles or wheezes.  Diminished breath sounds bilaterally. CVS: S1 and S2 heard, no murmur. Regular rate and rhythm.  ABDOMEN: Soft, non-tender, bowel sounds are present. EXTREMITIES: No edema. CNS: Cranial nerves are intact. No focal motor deficits. SKIN: warm and dry without rashes.  Data Review: I have personally reviewed the following laboratory data and studies,  CBC: Recent Labs  Lab 02/07/21 1350 02/08/21 0435  WBC 6.4  --   HGB 10.3* 9.9*  HCT 30.8* 29.6*  MCV 104.8*  --   PLT 198  --    Basic Metabolic Panel: Recent Labs  Lab 02/07/21 1350 02/08/21 0435  NA 139 139  K 4.3 4.1  CL 105 105  CO2 27 28  GLUCOSE 83 100*  BUN 29*  23  CREATININE 1.29* 1.18  CALCIUM 9.1 8.9   Liver Function Tests: Recent Labs  Lab 02/07/21 1350  AST 31  ALT 28  ALKPHOS 60  BILITOT 0.4  PROT 6.8  ALBUMIN 3.9   No results for input(s): LIPASE, AMYLASE in the last 168 hours. No results for input(s): AMMONIA in the last 168 hours. Cardiac Enzymes: No results for input(s): CKTOTAL, CKMB, CKMBINDEX, TROPONINI in the last 168 hours. BNP (last 3 results) No results for input(s): BNP in the last 8760 hours.  ProBNP (last 3 results) No results for input(s): PROBNP in the last 8760 hours.  CBG: No results for input(s): GLUCAP in the last 168 hours. Recent Results (from the past 240 hour(s))  Resp Panel by RT-PCR (Flu A&B, Covid) Nasopharyngeal Swab     Status: None   Collection Time:  01/30/21  9:40 AM   Specimen: Nasopharyngeal Swab; Nasopharyngeal(NP) swabs in vial transport medium  Result Value Ref Range Status   SARS Coronavirus 2 by RT PCR NEGATIVE NEGATIVE Final    Comment: (NOTE) SARS-CoV-2 target nucleic acids are NOT DETECTED.  The SARS-CoV-2 RNA is generally detectable in upper respiratory specimens during the acute phase of infection. The lowest concentration of SARS-CoV-2 viral copies this assay can detect is 138 copies/mL. A negative result does not preclude SARS-Cov-2 infection and should not be used as the sole basis for treatment or other patient management decisions. A negative result may occur with  improper specimen collection/handling, submission of specimen other than nasopharyngeal swab, presence of viral mutation(s) within the areas targeted by this assay, and inadequate number of viral copies(<138 copies/mL). A negative result must be combined with clinical observations, patient history, and epidemiological information. The expected result is Negative.  Fact Sheet for Patients:  BloggerCourse.com  Fact Sheet for Healthcare Providers:  SeriousBroker.it  This test is no t yet approved or cleared by the [place] FDA and  has been authorized for detection and/or diagnosis of SARS-CoV-2 by FDA under an Emergency Use Authorization (EUA). This EUA will remain  in effect (meaning this test can be used) for the duration of the COVID-19 declaration under Section 564(b)(1) of the Act, 21 U.S.C.section 360bbb-3(b)(1), unless the authorization is terminated  or revoked sooner.       Influenza A by PCR NEGATIVE NEGATIVE Final   Influenza B by PCR NEGATIVE NEGATIVE Final    Comment: (NOTE) The Xpert Xpress SARS-CoV-2/FLU/RSV plus assay is intended as an aid in the diagnosis of influenza from Nasopharyngeal swab specimens and should not be used as a sole basis for treatment. Nasal washings  and aspirates are unacceptable for Xpert Xpress SARS-CoV-2/FLU/RSV testing.  Fact Sheet for Patients: BloggerCourse.com  Fact Sheet for Healthcare Providers: SeriousBroker.it  This test is not yet approved or cleared by the [place] FDA and has been authorized for detection and/or diagnosis of SARS-CoV-2 by FDA under an Emergency Use Authorization (EUA). This EUA will remain in effect (meaning this test can be used) for the duration of the COVID-19 declaration under Section 564(b)(1) of the Act, 21 U.S.C. section 360bbb-3(b)(1), unless the authorization is terminated or revoked.  Performed at [facility], [address] [place], [place] [address]      Studies: No results found.    Joycelyn Das, MD  [facility] 02/08/2021  If 7PM-7AM, please contact night-coverage

## 2021-02-08 NOTE — ED Notes (Signed)
Pt taken to Endo by Endo RN, report given at bedside.

## 2021-02-09 DIAGNOSIS — R5381 Other malaise: Secondary | ICD-10-CM

## 2021-02-09 DIAGNOSIS — K2970 Gastritis, unspecified, without bleeding: Secondary | ICD-10-CM

## 2021-02-09 DIAGNOSIS — K2210 Ulcer of esophagus without bleeding: Principal | ICD-10-CM

## 2021-02-09 DIAGNOSIS — K2990 Gastroduodenitis, unspecified, without bleeding: Secondary | ICD-10-CM

## 2021-02-09 LAB — SURGICAL PATHOLOGY

## 2021-02-09 MED ORDER — PANTOPRAZOLE SODIUM 40 MG PO TBEC
40.0000 mg | DELAYED_RELEASE_TABLET | Freq: Two times a day (BID) | ORAL | Status: DC
  Administered 2021-02-09 – 2021-02-10 (×2): 40 mg via ORAL
  Filled 2021-02-09 (×2): qty 1

## 2021-02-09 MED ORDER — ACETAMINOPHEN 325 MG PO TABS
650.0000 mg | ORAL_TABLET | Freq: Four times a day (QID) | ORAL | Status: AC | PRN
  Administered 2021-02-09: 650 mg via ORAL
  Filled 2021-02-09: qty 2

## 2021-02-09 MED ORDER — SUCRALFATE 1 GM/10ML PO SUSP
1.0000 g | Freq: Three times a day (TID) | ORAL | Status: DC
  Administered 2021-02-09 – 2021-02-10 (×5): 1 g via ORAL
  Filled 2021-02-09 (×4): qty 10

## 2021-02-09 MED ORDER — LORATADINE 10 MG PO TABS
10.0000 mg | ORAL_TABLET | Freq: Every day | ORAL | Status: DC
  Administered 2021-02-09 – 2021-02-10 (×2): 10 mg via ORAL
  Filled 2021-02-09 (×2): qty 1

## 2021-02-09 MED ORDER — AMLODIPINE BESYLATE 5 MG PO TABS
2.5000 mg | ORAL_TABLET | Freq: Every day | ORAL | Status: DC
  Administered 2021-02-09 – 2021-02-10 (×2): 2.5 mg via ORAL
  Filled 2021-02-09 (×2): qty 1

## 2021-02-09 MED ORDER — METOPROLOL SUCCINATE ER 25 MG PO TB24
25.0000 mg | ORAL_TABLET | Freq: Every day | ORAL | Status: DC
  Administered 2021-02-09 – 2021-02-10 (×2): 25 mg via ORAL
  Filled 2021-02-09 (×2): qty 1

## 2021-02-09 MED ORDER — LEVOTHYROXINE SODIUM 50 MCG PO TABS
75.0000 ug | ORAL_TABLET | Freq: Every day | ORAL | Status: DC
  Administered 2021-02-10: 75 ug via ORAL
  Filled 2021-02-09: qty 1

## 2021-02-09 NOTE — Progress Notes (Signed)
PROGRESS NOTE  SUMMIT ARROYAVE TKZ:[id number] DOB: 1921/11/16 DOA: 02/07/2021 PCP: Chilton Greathouse, MD   LOS: 1 day   Brief narrative:  Ricky Sandoval is a 87 y.o. male with medical history significant of dysphagia, HTN, hypothyroidism presented to the hospital with difficulty swallowing on and off for weeks.  Patient has been seen by [facility] and had a history of dilatation in the past.  Patient underwent endoscopic evaluation on 02/08/2021.  Assessment/Plan:  Principal Problem:   Dysphagia Active Problems:   Ulcer of esophagus without bleeding   Gastritis and gastroduodenitis  Dysphagia with food impaction.    Status post endoscopic intervention yesterday with findings of presbyesophagus, ulcer.  Benign-appearing widely patent esophageal stricture.  GI has recommended PPI twice daily, sucralfate.  Continue Protonix ,Zofran IV fluids.  GI recommended outpatient barium studies and follow-up.  Soft diet as tolerated.   Essential HTN Patient has been started back on amlodipine and metoprolol.  Continue to monitor closely.   Hypothyroidism Continue Synthroid.   Hyperlipidemia Statins on hold.   CKD3a currently at baseline.  We will continue to monitor.  Debility, deconditioning.  Patient has been seen by physical therapy who recommended skilled nursing facility placement.    DVT prophylaxis: SCDs Start: 02/07/21 2139   Code Status: Full code  Family Communication: None  Status is: Inpatient  The patient is inpatient because: Ongoing diagnostic testing needed not appropriate for outpatient work up and IV treatments appropriate due to intensity of illness or inability to take PO, skilled nursing facility placement.  Dispo: The patient is from: Home              Anticipated d/c is to: Home              Patient currently is not medically stable to d/c.   Difficult to place patient No   Consultants: GI  Procedures: EGD on  02/08/2021  Anti-infectives:  None  Anti-infectives (From admission, onward)    None      Subjective: Today, patient was seen and examined at bedside.  Status post endoscopic evaluation yesterday.  Feels a little better today.  Denies any chest pain, shortness of breath, fever or chills.  Objective: Vitals:   02/09/21 0104 02/09/21 0455  BP: (!) 116/50 127/65  Pulse: 84 80  Resp: 14 16  Temp: 99.7 F (37.6 C) 99.5 F (37.5 C)  SpO2: 92% 93%    Intake/Output Summary (Last 24 hours) at 02/09/2021 1152 Last data filed at 02/09/2021 0932 Gross per 24 hour  Intake 1620 ml  Output --  Net 1620 ml    Filed Weights   02/07/21 1050 02/08/21 1700  Weight: 73 kg 73.5 kg   Body mass index is 26.96 kg/m.   Physical Exam: GENERAL: Patient is alert awake and oriented. Not in obvious distress. HENT: No scleral pallor or icterus. Pupils equally reactive to light. Oral mucosa is moist NECK: is supple, no gross swelling noted. CHEST: Clear to auscultation. No crackles or wheezes.  Diminished breath sounds bilaterally. CVS: S1 and S2 heard, no murmur. Regular rate and rhythm.  ABDOMEN: Soft, non-tender, bowel sounds are present. EXTREMITIES: No edema. CNS: Cranial nerves are intact. No focal motor deficits. SKIN: warm and dry without rashes.  Data Review: I have personally reviewed the following laboratory data and studies,  CBC: Recent Labs  Lab 02/07/21 1350 02/08/21 0435  WBC 6.4  --   HGB 10.3* 9.9*  HCT 30.8* 29.6*  MCV 104.8*  --  PLT 198  --     Basic Metabolic Panel: Recent Labs  Lab 02/07/21 1350 02/08/21 0435  NA 139 139  K 4.3 4.1  CL 105 105  CO2 27 28  GLUCOSE 83 100*  BUN 29* 23  CREATININE 1.29* 1.18  CALCIUM 9.1 8.9    Liver Function Tests: Recent Labs  Lab 02/07/21 1350  AST 31  ALT 28  ALKPHOS 60  BILITOT 0.4  PROT 6.8  ALBUMIN 3.9    No results for input(s): LIPASE, AMYLASE in the last 168 hours. No results for input(s):  AMMONIA in the last 168 hours. Cardiac Enzymes: No results for input(s): CKTOTAL, CKMB, CKMBINDEX, TROPONINI in the last 168 hours. BNP (last 3 results) No results for input(s): BNP in the last 8760 hours.  ProBNP (last 3 results) No results for input(s): PROBNP in the last 8760 hours.  CBG: No results for input(s): GLUCAP in the last 168 hours. No results found for this or any previous visit (from the past 240 hour(s)).    Studies: No results found.    Joycelyn Das, MD  [facility] 02/09/2021  If 7PM-7AM, please contact night-coverage

## 2021-02-09 NOTE — TOC Progression Note (Addendum)
Transition of Care [facility]) - Progression Note    Patient Details  Name: Ricky Sandoval MRN: [id number] Date of Birth: [birth year]  Transition of Care [facility]) CM/SW Contact  Golda Acre, RN Phone Number: 02/09/2021, 10:00 AM  Clinical Narrative:    Must id [phone] Passar number is [phone] A Ssn:[phone] Pt is recommending snf placement for rehab before returning to alf [facility]  Accepted N/A [address] [place] [address] [phone] --  [facility]  Accepted N/A [address], [place] [address] [phone] --  [facility] Preferred SNF  Accepted N/A [address], [place] [address] [phone] --  [facility] Preferred [facility]  Accepted N/A [address] [place] [address] [phone] --  [facility]  Accepted N/A [address] [place], [place] [address] [phone] --  [facility] Preferred SNF  Accepted N/A [address] [place] [address] [phone] --  [facility]  Accepted N/A [address] [place] [address] [phone] --   Accepted bed offers made as [phone]      Expected Discharge Plan and Services                                                 Social Determinants of Health (SDOH) Interventions    Readmission Risk Interventions No flowsheet data found.

## 2021-02-09 NOTE — Evaluation (Signed)
Physical Therapy Evaluation Patient Details Name: Ricky Sandoval MRN: [id number] DOB: 03/28/1922 Today's Date: 02/09/2021   History of Present Illness  Ricky Sandoval is a 87 y.o. male presents with difficulty swallowing. Recent EGD 6/14; previous EGD 6/5 with food bolus impaction removed and EGD 6/7 with balloon dilation. PMH: DM, HTN, HLD, hypothyroidism  Clinical Impression  Pt admitted with above diagnosis. Pt reports being independent with RW at baseline, able to ambulate around assisted living facility without assist and denies falls. Pt presents supine in bed eating breakfast, reports he hasn't been OOB since in hospital. Pt requires min A to upright trunk and scoot out to EOB. Pt requires min A to power to stand and to steady with limited steps over to recliner in room with trunk flexed and generally unsteady. Pt instructed on seated BLE strengthening exercises, performs with good form and educated to perform additional sets/reps while up in chair and pt verbalizes agreement. Pt reports ALF unable to provide physical assistance, so recommending SNF for ST strengthening return to ALF. Pt currently with functional limitations due to the deficits listed below (see PT Problem List). Pt will benefit from skilled PT to increase their independence and safety with mobility to allow discharge to the venue listed below.       Follow Up Recommendations SNF    Equipment Recommendations  None recommended by PT    Recommendations for Other Services       Precautions / Restrictions Precautions Precautions: Fall Restrictions Weight Bearing Restrictions: No      Mobility  Bed Mobility Overal bed mobility: Needs Assistance Bed Mobility: Supine to Sit  Supine to sit: Min assist;HOB elevated  General bed mobility comments: pt pulls on therapist's hand to upright trunk and requires min A to scoot out to EOB    Transfers Overall transfer level: Needs assistance Equipment used: Rolling walker  (2 wheeled) Transfers: Sit to/from Stand Sit to Stand: Min assist  General transfer comment: VCs for hand placement and glute/quad activation, min A to power to stand and steady with weight slightly posterior that improves with standing  Ambulation/Gait Ambulation/Gait assistance: Min assist Gait Distance (Feet): 4 Feet Assistive device: Rolling walker (2 wheeled) Gait Pattern/deviations: Step-to pattern;Decreased stride length;Shuffle;Trunk flexed Gait velocity: decreased   General Gait Details: pt takes short, shuffling, generally unsteady steps over to recliner, maintain trunk flexed forward despite cues  Stairs            Wheelchair Mobility    Modified Rankin (Stroke Patients Only)       Balance Overall balance assessment: Needs assistance Sitting-balance support: Feet supported Sitting balance-Leahy Scale: Fair Sitting balance - Comments: seated EOB   Standing balance support: During functional activity;Bilateral upper extremity supported Standing balance-Leahy Scale: Poor Standing balance comment: reliant on UE support       Pertinent Vitals/Pain Pain Assessment: No/denies pain    Home Living Family/patient expects to be discharged to:: Assisted living  Home Equipment: Walker - 2 wheels      Prior Function Level of Independence: Independent with assistive device(s)         Comments: Pt reports independent with RW, ambulates around facility to dining hall, independent with bathing and dressing. Pt reports facility provides meals and is there if he falls but doesn't offer assistance.     Hand Dominance        Extremity/Trunk Assessment   Upper Extremity Assessment Upper Extremity Assessment: Overall WFL for tasks assessed    Lower Extremity Assessment  Lower Extremity Assessment: Generalized weakness    Cervical / Trunk Assessment Cervical / Trunk Assessment: Kyphotic  Communication   Communication: No difficulties  Cognition  Arousal/Alertness: Awake/alert Behavior During Therapy: WFL for tasks assessed/performed Overall Cognitive Status: Within Functional Limits for tasks assessed  General Comments: pt a&o x4      General Comments      Exercises     Assessment/Plan    PT Assessment Patient needs continued PT services  PT Problem List Decreased strength;Decreased activity tolerance;Decreased balance;Decreased mobility       PT Treatment Interventions DME instruction;Gait training;Functional mobility training;Therapeutic activities;Therapeutic exercise;Balance training;Patient/family education    PT Goals (Current goals can be found in the Care Plan section)  Acute Rehab PT Goals Patient Stated Goal: "I'm going home today if I pass your test" PT Goal Formulation: With patient Time For Goal Achievement: 02/23/21 Potential to Achieve Goals: Good    Frequency Min 3X/week   Barriers to discharge        Co-evaluation               AM-PAC PT "6 Clicks" Mobility  Outcome Measure Help needed turning from your back to your side while in a flat bed without using bedrails?: A Little Help needed moving from lying on your back to sitting on the side of a flat bed without using bedrails?: A Little Help needed moving to and from a bed to a chair (including a wheelchair)?: A Little Help needed standing up from a chair using your arms (e.g., wheelchair or bedside chair)?: A Little Help needed to walk in hospital room?: A Little Help needed climbing 3-5 steps with a railing? : A Lot 6 Click Score: 17    End of Session Equipment Utilized During Treatment: Gait belt Activity Tolerance: Patient tolerated treatment well Patient left: in chair;with call bell/phone within reach Nurse Communication: Mobility status PT Visit Diagnosis: Other abnormalities of gait and mobility (R26.89);Muscle weakness (generalized) (M62.81)    Time: 6599-3570 PT Time Calculation (min) (ACUTE ONLY): 19 min   Charges:    PT Evaluation $PT Eval Low Complexity: 1 Low PT Treatments $Therapeutic Exercise: 8-22 mins         Ricky Sandoval PT, DPT 02/09/21, 9:51 AM

## 2021-02-09 NOTE — NC FL2 (Signed)
[place] MEDICAID FL2 LEVEL OF CARE SCREENING TOOL     IDENTIFICATION  Patient Name: Ricky Sandoval Birthdate: November 19, 1921 Sex: male Admission Date (Current Location): 02/07/2021  [facility] and [place] Number:  Producer, television/film/video and Address:  [facility],  [address] [place] [address]      Provider Number: [id number]  Attending Physician Name and Address:  Joycelyn Das, MD  Relative Name and Phone Number:       Current Level of Care: Hospital Recommended Level of Care: Skilled Nursing Facility Prior Approval Number:    Date Approved/Denied:   PASRR Number: [id number]  Discharge Plan: SNF    Current Diagnoses: Patient Active Problem List   Diagnosis Date Noted   Ulcer of esophagus without bleeding    Gastritis and gastroduodenitis    Dysphagia    Duodenitis with bleeding    Duodenal erosion    Esophageal stricture     Orientation RESPIRATION BLADDER Height & Weight     Self, Time, Situation, Place  Normal Continent Weight: 73.5 kg Height:  5\' 5"  (165.1 cm)  BEHAVIORAL SYMPTOMS/MOOD NEUROLOGICAL BOWEL NUTRITION STATUS      Continent Diet (regular)  AMBULATORY STATUS COMMUNICATION OF NEEDS Skin   Extensive Assist Verbally Normal                       Personal Care Assistance Level of Assistance  Bathing, Feeding, Dressing Bathing Assistance: Limited assistance Feeding assistance: Limited assistance Dressing Assistance: Limited assistance     Functional Limitations Info  Sight, Hearing, Speech Sight Info: Adequate Hearing Info: Adequate Speech Info: Adequate    SPECIAL CARE FACTORS FREQUENCY  PT (By licensed PT), OT (By licensed OT)     PT Frequency: 5 x weekly OT Frequency: 5 x weekly            Contractures Contractures Info: Not present    Additional Factors Info  Code Status Code Status Info: full             Current Medications (02/09/2021):  This is the current hospital active medication list Current  Facility-Administered Medications  Medication Dose Route Frequency Provider Last Rate Last Admin   acetaminophen (TYLENOL) tablet 650 mg  650 mg Oral Q6H PRN 02/11/2021, AGNP-C       amLODipine (NORVASC) tablet 2.5 mg  2.5 mg Oral Daily Pokhrel, Laxman, MD       dextrose 5 % in lactated ringers infusion   Intravenous Continuous Kyle, Tyrone A, DO 125 mL/hr at 02/09/21 0845 Infusion Verify at 02/09/21 0845   fluticasone (FLONASE) 50 MCG/ACT nasal spray 1 spray  1 spray Each Nare Daily Kyle, Tyrone A, DO   1 spray at 02/08/21 0956   hydrALAZINE (APRESOLINE) injection 10 mg  10 mg Intravenous Q6H PRN Pokhrel, Laxman, MD   10 mg at 02/08/21 1929   ketotifen (ZADITOR) 0.025 % ophthalmic solution 1 drop  1 drop Both Eyes TID 02/10/21, Tyrone A, DO   1 drop at 02/09/21 1001   [START ON 02/10/2021] levothyroxine (SYNTHROID) tablet 75 mcg  75 mcg Oral Q0600 Pokhrel, Laxman, MD       loratadine (CLARITIN) tablet 10 mg  10 mg Oral Daily Pokhrel, Laxman, MD   10 mg at 02/09/21 1001   metoprolol succinate (TOPROL-XL) 24 hr tablet 25 mg  25 mg Oral Daily Pokhrel, Laxman, MD   25 mg at 02/09/21 1001   metoprolol tartrate (LOPRESSOR) injection 5 mg  5  mg Intravenous Q6H PRN Ronaldo Miyamoto, Tyrone A, DO   5 mg at 02/08/21 0957   ondansetron (ZOFRAN) injection 4 mg  4 mg Intravenous Q6H PRN Ronaldo Miyamoto, Tyrone A, DO   4 mg at 02/08/21 1745   pantoprazole (PROTONIX) injection 40 mg  40 mg Intravenous Q12H Kyle, Tyrone A, DO   40 mg at 02/09/21 1001   polyvinyl alcohol (LIQUIFILM TEARS) 1.4 % ophthalmic solution 1 drop  1 drop Both Eyes BID PRN Ronaldo Miyamoto, Tyrone A, DO       sucralfate (CARAFATE) 1 GM/10ML suspension 1 g  1 g Oral TID WC & HS Pokhrel, Laxman, MD         Discharge Medications: Please see discharge summary for a list of discharge medications.  Relevant Imaging Results:  Relevant Lab Results:   Additional Information ssn:[id number]  Golda Acre, RN

## 2021-02-09 NOTE — Progress Notes (Signed)
Initial Nutrition Assessment  INTERVENTION:   -Magic cup BID with meals, each supplement provides 290 kcal and 9 grams of protein  -Placed lunch order per patient request  NUTRITION DIAGNOSIS:   Inadequate oral intake related to dysphagia as evidenced by per patient/family report.  GOAL:   Patient will meet greater than or equal to 90% of their needs  MONITOR:   PO intake, Supplement acceptance, Labs, Weight trends, I & O's  REASON FOR ASSESSMENT:   Malnutrition Screening Tool    ASSESSMENT:   87 y.o. male with medical history significant of dysphagia, HTN, hypothyroidism presented to the hospital with difficulty swallowing on and off for weeks.  Patient has been seen by [facility] and had a history of dilatation in the past.  6/14: s/p EGD - esophageal ulcer and prebyesophagus  Patient in room sitting in chair. States he feels good today. Feels hungry and is not having any issues with swallowing today. Ate 100% of breakfast this morning. Helped pt pick out something for lunch and RD placed order per pt request. Will order Magic cups with meals.  Per weight records, pt has lost 21 lbs since 1/29 (11% wt loss x 4.5 months, significant for time frame).   Medications: Carafate, D5 infusion  Labs reviewed.  NUTRITION - FOCUSED PHYSICAL EXAM:  Flowsheet Row Most Recent Value  Orbital Region Mild depletion  Upper Arm Region Mild depletion  Thoracic and Lumbar Region Unable to assess  Buccal Region Mild depletion  Temple Region Mild depletion  Clavicle Bone Region Mild depletion  Clavicle and Acromion Bone Region Mild depletion  Scapular Bone Region Mild depletion  Dorsal Hand Mild depletion  Patellar Region Moderate depletion  Anterior Thigh Region Mild depletion  Posterior Calf Region Moderate depletion  Edema (RD Assessment) None       Diet Order:   Diet Order             Diet regular Room service appropriate? Yes; Fluid consistency: Thin  Diet effective now                    EDUCATION NEEDS:   No education needs have been identified at this time  Skin:  Skin Assessment: Reviewed RN Assessment  Last BM:  PTA  Height:   Ht Readings from Last 1 Encounters:  02/08/21 5\' 5"  (1.651 m)    Weight:   Wt Readings from Last 1 Encounters:  02/08/21 73.5 kg    BMI:  Body mass index is 26.96 kg/m.  Estimated Nutritional Needs:   Kcal:  1500-1700  Protein:  75-85g  Fluid:  1.5L/day  02/10/21, MS, RD, LDN Inpatient Clinical Dietitian Contact information available via Amion

## 2021-02-09 NOTE — Progress Notes (Signed)
[facility] Progress Note  CC:  Dysphagia  Subjective: He feels well after EGD yesterday. He is tolerating a regular diet without difficulty. No N/V. No abdominal pain. No BM since admission. He wants to go home.   Objective:   EGD 02/08/2021: - Esophageal ulcer with no bleeding and no stigmata of recent bleeding. Biopsied. This is stable if not improved compared to endoscopy images from earlier this month. - Tortuous esophagus, consistent with presbyesophagus. - Benign-appearing, widely patent esophageal stricture. No obstruction or food impaction seen today. - Gastritis. Biopsied. - Medium-sized hiatal hernia. - Erythematous duodenopathy. Biopsied. - Dysphagia may be due to esophageal ulcer and presbyesophagus. Must consider underlying dysmotility.  Vital signs in last 24 hours: Temp:  [98 F (36.7 C)-99.7 F (37.6 C)] 99.5 F (37.5 C) (06/15 0455) Pulse Rate:  [62-84] 80 (06/15 0455) Resp:  [14-23] 16 (06/15 0455) BP: (89-214)/(35-107) 127/65 (06/15 0455) SpO2:  [92 %-98 %] 93 % (06/15 0455) Weight:  [73.5 kg] 73.5 kg (06/14 1700)   General:   Alert 87 year old male in NAD. HOH.  Heart: RRR, no murmur.  Pulm: Breath sounds clear throughout.  Abdomen: Soft, nontender. + BS x 4 quads.  Extremities:  Without edema. Neurologic:  Alert and  oriented x4. Grossly normal neurologically. Psych:  Alert and cooperative. Normal mood and affect.  Intake/Output from previous day: 06/14 0701 - 06/15 0700 In: 500 [I.V.:500] Out: -  Intake/Output this shift: Total I/O In: 1000 [I.V.:1000] Out: -   Lab Results: Recent Labs    02/07/21 1350 02/08/21 0435  WBC 6.4  --   HGB 10.3* 9.9*  HCT 30.8* 29.6*  PLT 198  --    BMET Recent Labs    02/07/21 1350 02/08/21 0435  NA 139 139  K 4.3 4.1  CL 105 105  CO2 27 28  GLUCOSE 83 100*  BUN 29* 23  CREATININE 1.29* 1.18  CALCIUM 9.1 8.9   LFT Recent Labs    02/07/21 1350  PROT 6.8  ALBUMIN 3.9   AST 31  ALT 28  ALKPHOS 60  BILITOT 0.4   PT/INR No results for input(s): LABPROT, INR in the last 72 hours. Hepatitis Panel No results for input(s): HEPBSAG, HCVAB, HEPAIGM, HEPBIGM in the last 72 hours.  No results found.  Assessment / Plan:  74.  87 year old male with admitted to the hospital 02/07/2021 with recurrent esophageal dysphagia. EGD 6/5 identified a food bolus impaction which was successfully removed with a distal esophageal ring and hiatal hernia.  He was prescribed Omeprazole 20 mg daily.  He had recurrent dysphagia and returned to the ED 6/7 and he underwent a repeat EGD which identified a benign appearing esophageal stenosis which was dilated, reflux esophagitis, a medium hiatal hernia and gastritis.  Biopsies were negative for H. pylori. Repeat EGD 02/08/2021 identified an esophageal ulcer with stigmata of recent bleeding, the esophagus was tortuous consistent with presbyesophagus, a benign-appearing widely patent esophageal stricture without evidence of obstruction or food impaction, medium size hiatal hernia and gastritis.  Dysphagia most likely due to esophageal ulcer and presbyesophagus, likely dysmotility disorder. He feels much better today. Tolerating a soft/regular diet without difficulty.  -Barium esophagram as an outpatient and follow-up with Dr. Marina Goodell and her GI clinic, our office will contact the patient to schedule  -Continue Pantoprazole 40 mg twice daily -Carafate slurry 1 g 4 times daily for 2-week -Soft diet as tolerated    2.  Chronic macrocytic anemia. Past  history of B12 deficiency and IDA. On Ferrous Sulfate 325mg  QD.  HG 10.3 -> 9.9. Iron 42. Ferritin 1,243. B12 677.  Follow up with PCP      .  Principal Problem:   Dysphagia Active Problems:   Ulcer of esophagus without bleeding   Gastritis and gastroduodenitis     LOS: 1 day    02/09/2021, 10:29 AM

## 2021-02-10 ENCOUNTER — Encounter (HOSPITAL_COMMUNITY): Payer: Self-pay | Admitting: Gastroenterology

## 2021-02-10 DIAGNOSIS — R1310 Dysphagia, unspecified: Secondary | ICD-10-CM

## 2021-02-10 LAB — HEMOGLOBIN AND HEMATOCRIT, BLOOD
HCT: 29.1 % — ABNORMAL LOW (ref 39.0–52.0)
Hemoglobin: 9.7 g/dL — ABNORMAL LOW (ref 13.0–17.0)

## 2021-02-10 LAB — RESP PANEL BY RT-PCR (FLU A&B, COVID) ARPGX2
Influenza A by PCR: NEGATIVE
Influenza B by PCR: NEGATIVE
SARS Coronavirus 2 by RT PCR: NEGATIVE

## 2021-02-10 MED ORDER — PANTOPRAZOLE SODIUM 40 MG PO TBEC: 40.0000 mg | DELAYED_RELEASE_TABLET | Freq: Two times a day (BID) | ORAL | 0 refills | Status: AC

## 2021-02-10 MED ORDER — SUCRALFATE 1 GM/10ML PO SUSP: 1.0000 g | Freq: Three times a day (TID) | ORAL | 0 refills | Status: DC

## 2021-02-10 MED ORDER — COVID-19 MRNA VACC (MODERNA) 50 MCG/0.25ML IM SUSP
0.2500 mL | Freq: Once | INTRAMUSCULAR | Status: AC
  Administered 2021-02-10: 0.25 mL via INTRAMUSCULAR
  Filled 2021-02-10: qty 0.25

## 2021-02-10 NOTE — Discharge Summary (Signed)
Physician Discharge Summary  JP EASTHAM RWE:[id number] DOB: 13-Mar-1922 DOA: 02/07/2021  PCP: Chilton Greathouse, MD  Admit date: 02/07/2021 Discharge date: 02/10/2021  Admitted From: Home Disposition: Home  Recommendations for Outpatient Follow-up:  Follow up with SNF provider at earliest convenience  outpatient follow-up with GI Recommend outpatient evaluation and follow-up by palliative care team for goals of care discussion Follow up in ED if symptoms worsen or new appear   Home Health: No Equipment/Devices: None  Discharge Condition: Guarded CODE STATUS: Full Diet recommendation: Heart healthy/soft diet  Brief/Interim Summary: 87 y.o. male with medical history significant of dysphagia, HTN, hypothyroidism presented to the hospital with difficulty swallowing on and off for weeks.  GI was consulted.  He underwent EGD.  Subsequently patient has tolerated diet.  GI has cleared the patient for discharge.  PT recommended SNF placement.  He will be discharged to SNF once bed is available.  Outpatient follow-up with GI.  Discharge Diagnoses:   Recurrent esophageal dysphagia -He underwent EGD which showed esophageal ulcer with no bleeding which was biopsied along with presbyesophagus and widely patent esophageal stricture along with gastritis, medium sized hiatal hernia and erythematous duodenopathy -Subsequently he is tolerating diet.  GI has cleared the patient for discharge on Protonix 40 mg twice a day for now.  Continue Carafate slurry 1 g 4 times daily for 2 weeks.  Soft diet as tolerated.  Outpatient follow-up with GI.  Essential hypertension -Resume home regimen  Hypothyroidism -Continue Synthroid  Hyperlipidemia -Resume statins  CKD stage IIIa -Currently at baseline.  Outpatient follow-up  Generalized debility/deconditioning -Outpatient follow-up with palliative care for goals of care discussion - will need PT at SNF.   Discharge Instructions  Discharge  Instructions     Ambulatory referral to Gastroenterology   Complete by: As directed    Hospital follow-up   Diet - low sodium heart healthy   Complete by: As directed    Soft diet   Increase activity slowly   Complete by: As directed       Allergies as of 02/10/2021       Reactions   Lisinopril    Other reaction(s): Airway constriction        Medication List     STOP taking these medications    HYDROcodone-acetaminophen 5-325 MG tablet Commonly known as: NORCO/VICODIN   omeprazole 20 MG capsule Commonly known as: PRILOSEC       TAKE these medications    acetaminophen 325 MG tablet Commonly known as: TYLENOL Take 650 mg by mouth 2 (two) times daily as needed for mild pain. What changed: Another medication with the same name was removed. Continue taking this medication, and follow the directions you see here.   amLODipine 2.5 MG tablet Commonly known as: NORVASC Take 2.5 mg by mouth daily.   aspirin 81 MG tablet Take 81 mg by mouth daily.   docusate sodium 100 MG capsule Commonly known as: COLACE Take 300 mg by mouth daily.   flunisolide 25 MCG/ACT (0.025%) Soln Commonly known as: NASALIDE Place 1 spray into the nose daily.   ICY HOT MEDICATED SPRAY EX Apply 1 application topically in the morning, at noon, in the evening, and at bedtime.   ketotifen 0.025 % ophthalmic solution Commonly known as: ZADITOR Place 1 drop into both eyes 3 (three) times daily.   levothyroxine 75 MCG tablet Commonly known as: SYNTHROID Take 75 mcg by mouth daily before breakfast.   loratadine 10 MG tablet Commonly known as: CLARITIN Take 10  mg by mouth daily.   metoprolol succinate 25 MG 24 hr tablet Commonly known as: TOPROL-XL Take 25 mg by mouth daily.   nortriptyline 25 MG capsule Commonly known as: PAMELOR Take 25 mg by mouth at bedtime.   OSTEO BI-FLEX ONE PER DAY PO Take 1 tablet by mouth daily. 1500MG -400MG    pantoprazole 40 MG tablet Commonly known  as: Protonix Take 1 tablet (40 mg total) by mouth 2 (two) times daily before a meal. What changed: when to take this   PRESERVISION AREDS PO Take 2 capsules by mouth daily. What changed: Another medication with the same name was removed. Continue taking this medication, and follow the directions you see here.   REFRESH TEARS OP Place 2 drops into both eyes 2 (two) times daily as needed (dry eyes).   simvastatin 40 MG tablet Commonly known as: ZOCOR Take 20 mg by mouth at bedtime.   sucralfate 1 GM/10ML suspension Commonly known as: CARAFATE Take 10 mLs (1 g total) by mouth 4 (four) times daily -  with meals and at bedtime.   vitamin C 500 MG tablet Commonly known as: ASCORBIC ACID Take 500 mg by mouth daily.   Vitamin D3 50 MCG (2000 UT) Tabs Take 2,000 Units by mouth daily.        Allergies  Allergen Reactions   Lisinopril     Other reaction(s): Airway constriction    Consultations: GI   Procedures/Studies: DG Chest Portable 1 View  Result Date: 01/30/2021 CLINICAL DATA:  Dysphagia.  Cough EXAM: PORTABLE CHEST 1 VIEW COMPARISON:  Jan 10, 2021 FINDINGS: No edema or airspace opacity. Heart is upper normal in size with pulmonary vascularity normal. No adenopathy. There is aortic atherosclerosis. There is degenerative change in each shoulder. IMPRESSION: No edema or airspace opacity. Heart upper normal in size. Aortic Atherosclerosis (ICD10-I70.0). Electronically Signed   By: 04/01/2021 III M.D.   On: 01/30/2021 09:57    EGD: -Showed esophageal ulcer with no bleeding which was biopsied along with presbyesophagus and widely patent esophageal stricture along with gastritis, medium sized hiatal hernia and erythematous duodenopathy  Subjective: Patient seen and examined at bedside.  He is able to swallow his breakfast.  Denies worsening shortness of breath, chest pain, abdominal pain, vomiting.  Discharge Exam: Vitals:   02/09/21 2037 02/10/21 0437  BP: (!) 116/57  (!) 172/63  Pulse: 72 74  Resp: 16 18  Temp: 99.3 F (37.4 C) 98.8 F (37.1 C)  SpO2: 99% 96%    General: Pt is alert, awake, not in acute distress.  Elderly male sitting on chair.  Currently on room air.  Poor historian. Cardiovascular: rate controlled, S1/S2 + Respiratory: bilateral decreased breath sounds at bases Abdominal: Soft, NT, ND, bowel sounds + Extremities: no edema, no cyanosis    The results of significant diagnostics from this hospitalization (including imaging, microbiology, ancillary and laboratory) are listed below for reference.     Microbiology: No results found for this or any previous visit (from the past 240 hour(s)).   Labs: BNP (last 3 results) No results for input(s): BNP in the last 8760 hours. Basic Metabolic Panel: Recent Labs  Lab 02/07/21 1350 02/08/21 0435  NA 139 139  K 4.3 4.1  CL 105 105  CO2 27 28  GLUCOSE 83 100*  BUN 29* 23  CREATININE 1.29* 1.18  CALCIUM 9.1 8.9   Liver Function Tests: Recent Labs  Lab 02/07/21 1350  AST 31  ALT 28  ALKPHOS 60  BILITOT  0.4  PROT 6.8  ALBUMIN 3.9   No results for input(s): LIPASE, AMYLASE in the last 168 hours. No results for input(s): AMMONIA in the last 168 hours. CBC: Recent Labs  Lab 02/07/21 1350 02/08/21 0435  WBC 6.4  --   HGB 10.3* 9.9*  HCT 30.8* 29.6*  MCV 104.8*  --   PLT 198  --    Cardiac Enzymes: No results for input(s): CKTOTAL, CKMB, CKMBINDEX, TROPONINI in the last 168 hours. BNP: Invalid input(s): POCBNP CBG: No results for input(s): GLUCAP in the last 168 hours. D-Dimer No results for input(s): DDIMER in the last 72 hours. Hgb A1c No results for input(s): HGBA1C in the last 72 hours. Lipid Profile No results for input(s): CHOL, HDL, LDLCALC, TRIG, CHOLHDL, LDLDIRECT in the last 72 hours. Thyroid function studies No results for input(s): TSH, T4TOTAL, T3FREE, THYROIDAB in the last 72 hours.  Invalid input(s): FREET3 Anemia work up Recent Labs     02/07/21 1350 02/07/21 1352  VITAMINB12 677  --   FOLATE  --  46.7  FERRITIN 1,243*  --   TIBC 262  --   IRON 42*  --    Urinalysis    Component Value Date/Time   COLORURINE YELLOW 11/13/2007 1049   APPEARANCEUR CLEAR 11/13/2007 1049   LABSPEC 1.019 11/13/2007 1049   PHURINE 7.5 11/13/2007 1049   GLUCOSEU NEGATIVE 11/13/2007 1049   HGBUR NEGATIVE 11/13/2007 1049   BILIRUBINUR NEGATIVE 11/13/2007 1049   KETONESUR NEGATIVE 11/13/2007 1049   PROTEINUR NEGATIVE 11/13/2007 1049   UROBILINOGEN 0.2 11/13/2007 1049   NITRITE NEGATIVE 11/13/2007 1049   LEUKOCYTESUR  11/13/2007 1049    NEGATIVE MICROSCOPIC NOT DONE ON URINES WITH NEGATIVE PROTEIN, BLOOD, LEUKOCYTES, NITRITE, OR GLUCOSE <1000 mg/dL.   Sepsis Labs Invalid input(s): PROCALCITONIN,  WBC,  LACTICIDVEN Microbiology No results found for this or any previous visit (from the past 240 hour(s)).   Time coordinating discharge: 35 minutes  SIGNED:   Glade Lloyd, MD  [facility] 02/10/2021, 10:50 AM

## 2021-02-10 NOTE — Progress Notes (Signed)
[facility] Progress Note  CC:   Dysphagia  Subjective:  He is eating a regular diet, pancakes and bacon this am without any difficulty swallowing. No N/V. No abdominal pain. No BM since admission. He stated having difficulty walking, moving his legs which started yesterday. He is needing assistance from bed to chair, has not walked in hallway.   Objective:   EGD 02/08/2021: - Esophageal ulcer with no bleeding and no stigmata of recent bleeding. Biopsied. This is stable if not improved compared to endoscopy images from earlier this month. - Tortuous esophagus, consistent with presbyesophagus. - Benign-appearing, widely patent esophageal stricture. No obstruction or food impaction seen today. - Gastritis. Biopsied. - Medium-sized hiatal hernia. - Erythematous duodenopathy. Biopsied. - Dysphagia may be due to esophageal ulcer and presbyesophagus. Must consider underlying dysmotility  Vital signs in last 24 hours: Temp:  [97.8 F (36.6 C)-99.3 F (37.4 C)] 98.8 F (37.1 C) (06/16 0437) Pulse Rate:  [67-74] 74 (06/16 0437) Resp:  [16-20] 18 (06/16 0437) BP: (116-172)/(48-63) 172/63 (06/16 0437) SpO2:  [94 %-99 %] 96 % (06/16 0437) Last BM Date: 02/07/21  General:   Alert 87 year old male in NAD. Sitting up in the chair.  Heart: RRR, no murmur.  Pulm:  Breath sounds clear throughout.  Abdomen: Soft, nondistended. Nontender. + BS x 4 quads.  Extremities:  Without edema. Neurologic:  Alert and  oriented x4;  grossly normal neurologically. Psych:  Alert and cooperative. Normal mood and affect.  Intake/Output from previous day: 06/15 0701 - 06/16 0700 In: 3628.9 [P.O.:240; I.V.:3388.9] Out: 2000 [Urine:2000] Intake/Output this shift: No intake/output data recorded.  Lab Results: Recent Labs    02/07/21 1350 02/08/21 0435  WBC 6.4  --   HGB 10.3* 9.9*  HCT 30.8* 29.6*  PLT 198  --    BMET Recent Labs    02/07/21 1350 02/08/21 0435  NA 139 139  K  4.3 4.1  CL 105 105  CO2 27 28  GLUCOSE 83 100*  BUN 29* 23  CREATININE 1.29* 1.18  CALCIUM 9.1 8.9   LFT Recent Labs    02/07/21 1350  PROT 6.8  ALBUMIN 3.9  AST 31  ALT 28  ALKPHOS 60  BILITOT 0.4   PT/INR No results for input(s): LABPROT, INR in the last 72 hours. Hepatitis Panel No results for input(s): HEPBSAG, HCVAB, HEPAIGM, HEPBIGM in the last 72 hours.  No results found.  Assessment / Plan:  80.  87 year old male with admitted to the hospital 02/07/2021 with recurrent esophageal dysphagia. EGD 6/5 identified a food bolus impaction which was successfully removed with a distal esophageal ring and hiatal hernia.  He was prescribed Omeprazole 20 mg daily.  He had recurrent dysphagia and returned to the ED 6/7 and he underwent a repeat EGD which identified a benign appearing esophageal stenosis which was dilated, reflux esophagitis, a medium hiatal hernia and gastritis.  Biopsies were negative for H. pylori. Repeat EGD 02/08/2021 identified an esophageal ulcer with stigmata of recent bleeding, the esophagus was tortuous consistent with presbyesophagus, a benign-appearing widely patent esophageal stricture without evidence of obstruction or food impaction, medium size hiatal hernia and gastritis.  Dysphagia most likely due to esophageal ulcer and presbyesophagus, likely dysmotility disorder. Tolerating a soft/regular diet without difficulty.  -Barium esophagram as an outpatient and follow-up with Dr. Marina Goodell and her GI clinic, our office will contact the patient to schedule. However, if patient is not discharged to assisted living facility today would consider  barium swallow later today. -Continue Pantoprazole 40 mg twice daily -Carafate slurry 1 g 4 times daily for 2-week -Soft diet as tolerated  -Our GI service will sign off today   2.  Chronic macrocytic anemia. Past history of B12 deficiency and IDA. On Ferrous Sulfate 325mg  QD.  HG 10.3 -> 9.9. Iron 42. Ferritin 1,243. B12 677.   Follow up with PCP   3. Difficulty with ambulation. Patient reports new onset of weakness/stiffness in legs -Recommend PT evaluation, defer to the hospitalist     Principal Problem:   Dysphagia Active Problems:   Ulcer of esophagus without bleeding   Gastritis and gastroduodenitis     LOS: 2 days    02/10/2021, 9:58 AM

## 2021-02-10 NOTE — TOC Progression Note (Addendum)
Transition of Care [facility]) - Progression Note    Patient Details  Name: Ricky Sandoval MRN: [id number] Date of Birth: 1922/01/13  Transition of Care [facility]) CM/SW Contact  Golda Acre, RN Phone Number: 02/10/2021, 10:49 AM  Clinical Narrative:    Patient is agreeing to go to snf-[facility] care a preferred provider.  Will need rapid covid test and booster md is aware. [facility] called for transport.  Covid is neg and booster has been given.        Expected Discharge Plan and Services           Expected Discharge Date: 02/10/21                                     Social Determinants of Health (SDOH) Interventions    Readmission Risk Interventions No flowsheet data found.

## 2021-02-10 NOTE — Progress Notes (Signed)
Pt discharged to [facility] via [facility] in stable condition. Discharge instructions and report called to [facility] RN, at the facility. No immediate questions at this time.

## 2021-02-11 ENCOUNTER — Telehealth: Payer: Self-pay

## 2021-02-11 ENCOUNTER — Other Ambulatory Visit: Payer: Self-pay

## 2021-02-11 DIAGNOSIS — K222 Esophageal obstruction: Secondary | ICD-10-CM

## 2021-02-11 DIAGNOSIS — R1310 Dysphagia, unspecified: Secondary | ICD-10-CM

## 2021-02-11 NOTE — Telephone Encounter (Signed)
I spoke with the children of the patient.  Patient is residing at [facility] presently for rehab. He was admitted there yesterday. On Monday 02/14/21, the family will know what the estimated length of stay will be for their father.  Discussed the barium swallow and briefly touched on esophageal manometry. The plan presently is to see what is decided about his rehab, then coordinate testing.  (I called [facility] and inquired about transportation for their residents. They will provide transportation for a fee to the family. If they family chooses this service, contact Yvette Hoe at [phone] ext [phone])

## 2021-02-11 NOTE — Telephone Encounter (Signed)
-----   Message from Hilarie Fredrickson, MD sent at 02/09/2021 11:30 AM EDT ----- Regarding: RE: Pt to be dicharged from hospita today He is a very delightful.  I am not sure how revealing the barium swallow is going to be, but I certainly agree with this as the next step.  Too bad that they did not do it as an inpatient, after his endoscopy.  In any event, I suspect that he will need esophageal manometry.  I would recommend office follow-up with either of [place], thereafter.  I am suspicious that he will continue to have complaints regarding his swallowing between now and then.  We shall see.  Thanks Dr. Marina Goodell ----- Message ----- From: Arnaldo Natal, NP Sent: 02/09/2021  11:02 AM EDT To: Hilarie Fredrickson, MD, Evalee Jefferson, LPN Subject: Pt to be dicharged from [facility] can you pls contact patient tomorrow to schedule an outpatient barium swallow dx: esophageal stenosis with recurrent dysphagia as recommended by Dr. Orvan Falconer and schedule him for a follow up appointment with Dr. Marina Goodell.   Dr. Marina Goodell, pls let Beth know if you want patient to follow up with me if you do not have an available appointment in the next 3 to 4 weeks. He is such a delightful man. 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   THX

## 2021-02-15 ENCOUNTER — Telehealth: Payer: Self-pay | Admitting: Hospice

## 2021-02-15 ENCOUNTER — Other Ambulatory Visit: Payer: Self-pay

## 2021-02-15 ENCOUNTER — Non-Acute Institutional Stay: Payer: Medicare Other | Admitting: Hospice

## 2021-02-15 DIAGNOSIS — Z515 Encounter for palliative care: Secondary | ICD-10-CM

## 2021-02-15 DIAGNOSIS — R1319 Other dysphagia: Secondary | ICD-10-CM

## 2021-02-15 DIAGNOSIS — R531 Weakness: Secondary | ICD-10-CM

## 2021-02-15 NOTE — Progress Notes (Signed)
Therapist, nutritional Palliative Care Consult Note Telephone: [phone]  Fax: [phone]  PATIENT NAME: Ricky Sandoval [address] [place] [address] [phone] (home)  DOB: 1922-06-06 MRN: [id number]  PRIMARY CARE PROVIDER:    Chilton Greathouse, MD,  [address] [place] [address] [phone]  REFERRING PROVIDER:   [place] Miller, NP  RESPONSIBLE PARTY:   Self Contact Information     Name Relation Home Work [place] Son [phone]     Ricky Sandoval [phone]          I met face to face with patient at facility. Palliative Care was asked to follow this patient by consultation request of Ricky Miller, NP to address advance care planning, complex medical decision making and goals of care clarification.  Patient endorsed palliative service.  NP called Ricky Sandoval and left him a voicemail with callback number.  This is the initial visit.    ASSESSMENT AND / RECOMMENDATIONS:   Advance Care Planning: Our advance care planning conversation included a discussion about:    The value and importance of advance care planning  Difference between Hospice and Palliative care Exploration of goals of care in the event of a sudden injury or illness  Identification and preparation of a healthcare agent  Review and updating or creation of an  advance directive document .   CODE STATUS: Discussion on ramifications and implications of CODE STATUS.  Patient affirmed that he is a full code.  Goals of Care: Goals include to maximize quality of life and symptom management  I spent 20  minutes providing this initial consultation. More than 50% of the time in this consultation was spent on counseling patient and coordinating communication. --------------------------------------------------------------------------------------------------------------------------------------  Symptom Management/Plan: Dysphagia: ST/PT/OT.  Continue regular  diet level 5 minced and moist texture.  Continue Carafate, Protonix as ordered.  Aspiration precautions.  Outpatient follow-up with GI. Weakness: PT OT is ongoing for strengthening.  Provide assistance as needed during meals to ensure adequate oral intake.  Fall precautions.  Routine CBC BMP Hypertension: Continue metoprolol amlodipine as ordered. Follow up: Palliative care will continue to follow for complex medical decision making, advance care planning, and clarification of goals. Return 6 weeks or prn.Encouraged to call provider sooner with any concerns.   Family /Caregiver/Community Supports: Patient in SNF for acute rehab  HOSPICE ELIGIBILITY/DIAGNOSIS: TBD  Chief Complaint: Initial Palliative care visit  HISTORY OF PRESENT ILLNESS:  Ricky Sandoval is a 87 y.o. year old male  with multiple medical conditions including esophageal dysphagia, recurrent, worsened in the last week for which patient was hospitalized  6/13 - 02/10/2021. GI was consulted and patient underwent EGD.  Patient reports dysphagia impairs his ability to eat well; it is worse when he swallows dry food.  Soft moist food and chewing very well before swallowing is helpful.  He endorsed weakness.  History of hypertension, depression, hypothyroidism.  History obtained from review of EMR, discussion with primary team, caregiver, family and/or Ricky Sandoval.  Review and summarization of Epic records shows history from other than patient. Rest of 10 point ROS asked and negative.     Review of lab tests/diagnostics   Recent Labs  Lab 02/10/21 1027  HGB 9.7*  HCT 29.1*   Results for Ricky Sandoval (MRN [id number]) as of 02/15/2021 14:21  Ref. Range 02/08/2021 04:35  Sodium Latest Ref Range: 135 - 145 mmol/L 139  Potassium Latest Ref Range: 3.5 - 5.1 mmol/L 4.1  Chloride Latest Ref  Range: 98 - 111 mmol/L 105  CO2 Latest Ref Range: 22 - 32 mmol/L 28  Glucose Latest Ref Range: 70 - 99 mg/dL 100 (H)  BUN Latest Ref Range: 8 - 23  mg/dL 23  Creatinine Latest Ref Range: 0.61 - 1.24 mg/dL 1.18  Calcium Latest Ref Range: 8.9 - 10.3 mg/dL 8.9  Anion gap Latest Ref Range: 5 - 15  6  GFR, Estimated Latest Ref Range: >60 mL/min 56 (L)    ROS General: NAD EYES: denies vision changes ENMT: Endorses difficulty swallowing sometimes Cardiovascular: denies chest pain/discomfort Pulmonary: denies cough, denies SOB Abdomen: endorses fair appetite, denies constipation/diarrhea GU: denies dysuria, urinary frequency MSK: Endorses weakness,  no falls reported Skin: denies rashes or wounds Neurological: denies pain, denies insomnia Psych: Endorses positive mood Heme/lymph/immuno: denies bruises, abnormal bleeding  Physical Exam:  Constitutional: NAD General: Well groomed, cooperative EYES: anicteric sclera, lids intact, no discharge  ENMT: Moist mucous membrane CV: S1 S2, RRR, no LE edema Pulmonary: LCTA, no increased work of breathing, no cough, Abdomen: active BS + 4 quadrants, soft and non tender GU: no suprapubic tenderness MSK: weakness, limited ROM Skin: warm and dry, no rashes or wounds on visible skin Neuro:  weakness, otherwise non focal Psych: non-anxious affect Hem/lymph/immuno: no widespread bruising   PAST MEDICAL HISTORY:  Active Ambulatory Problems    Diagnosis Date Noted   Duodenal erosion    Esophageal stricture    Dysphagia    Duodenitis with bleeding    Ulcer of esophagus without bleeding    Gastritis and gastroduodenitis    Resolved Ambulatory Problems    Diagnosis Date Noted   Food impaction of esophagus    Past Medical History:  Diagnosis Date   Diabetes type 2, controlled (HCC)    High cholesterol    Hypertension    Hypothyroid     SOCIAL HX:  Social History   Tobacco Use   Smoking status: Never   Smokeless tobacco: Never  Substance Use Topics   Alcohol use: Not Currently     FAMILY HX: No family history on file.    ALLERGIES:  Allergies  Allergen Reactions    Lisinopril     Other reaction(s): Airway constriction      PERTINENT MEDICATIONS:  Outpatient Encounter Medications as of 02/15/2021  Medication Sig   acetaminophen (TYLENOL) 325 MG tablet Take 650 mg by mouth 2 (two) times daily as needed for mild pain.   amLODipine (NORVASC) 2.5 MG tablet Take 2.5 mg by mouth daily.   aspirin 81 MG tablet Take 81 mg by mouth daily.   Boswellia-Glucosamine-Vit D (OSTEO BI-FLEX ONE PER DAY PO) Take 1 tablet by mouth daily. $RemoveBefo'1500MG'[id number]$ -$Remo'400MG'WbTzK$    Carboxymethylcellulose Sodium (REFRESH TEARS OP) Place 2 drops into both eyes 2 (two) times daily as needed (dry eyes).   Cholecalciferol (VITAMIN D3) 50 MCG (2000 UT) TABS Take 2,000 Units by mouth daily.   docusate sodium (COLACE) 100 MG capsule Take 300 mg by mouth daily.   flunisolide (NASALIDE) 25 MCG/ACT (0.025%) SOLN Place 1 spray into the nose daily.   ketotifen (ZADITOR) 0.025 % ophthalmic solution Place 1 drop into both eyes 3 (three) times daily.   levothyroxine (SYNTHROID, LEVOTHROID) 75 MCG tablet Take 75 mcg by mouth daily before breakfast.   loratadine (CLARITIN) 10 MG tablet Take 10 mg by mouth daily.   Menthol, Topical Analgesic, (ICY HOT MEDICATED SPRAY EX) Apply 1 application topically in the morning, at noon, in the evening, and at bedtime.  metoprolol succinate (TOPROL-XL) 25 MG 24 hr tablet Take 25 mg by mouth daily.   Multiple Vitamins-Minerals (PRESERVISION AREDS PO) Take 2 capsules by mouth daily.   nortriptyline (PAMELOR) 25 MG capsule Take 25 mg by mouth at bedtime.   pantoprazole (PROTONIX) 40 MG tablet Take 1 tablet (40 mg total) by mouth 2 (two) times daily before a meal.   simvastatin (ZOCOR) 40 MG tablet Take 20 mg by mouth at bedtime.   sucralfate (CARAFATE) 1 GM/10ML suspension Take 10 mLs (1 g total) by mouth 4 (four) times daily -  with meals and at bedtime.   vitamin C (ASCORBIC ACID) 500 MG tablet Take 500 mg by mouth daily.   No facility-administered encounter medications on file as  of 02/15/2021.     Thank you for the opportunity to participate in the care of Mr. Mahler.  The palliative care team will continue to follow. Please call our office at [phone] if we can be of additional assistance.   Note: Portions of this note were generated with [employer]. Dictation errors may occur despite best attempts at proofreading.  Teodoro Spray, NP

## 2021-02-15 NOTE — Telephone Encounter (Signed)
[name] called back and was updated on visit/patient's status. He expressed appreciation for the update.

## 2021-02-23 NOTE — Telephone Encounter (Signed)
Patient has been entered into palliative care and he is still in rehab center. I am closing this note until his children and he decide what they are willing to do at this point. Palliative care is aware of the swallowing issues.

## 2021-07-05 ENCOUNTER — Ambulatory Visit: Payer: Medicare Other | Admitting: Physical Medicine and Rehabilitation

## 2021-07-08 ENCOUNTER — Other Ambulatory Visit: Payer: Self-pay | Admitting: Chiropractic Medicine

## 2021-07-08 DIAGNOSIS — M47816 Spondylosis without myelopathy or radiculopathy, lumbar region: Secondary | ICD-10-CM

## 2021-07-08 DIAGNOSIS — M25551 Pain in right hip: Secondary | ICD-10-CM

## 2021-08-16 ENCOUNTER — Other Ambulatory Visit: Payer: Medicare Other

## 2021-08-16 ENCOUNTER — Inpatient Hospital Stay
Admission: RE | Admit: 2021-08-16 | Discharge: 2021-08-16 | Disposition: A | Discharge status: Home / Self Care | Payer: Medicare Other | Source: Ambulatory Visit | Attending: Chiropractic Medicine | Admitting: Chiropractic Medicine

## 2021-08-16 NOTE — Discharge Instructions (Signed)

## 2021-08-30 ENCOUNTER — Other Ambulatory Visit: Payer: Self-pay

## 2021-08-30 ENCOUNTER — Ambulatory Visit
Admission: RE | Admit: 2021-08-30 | Discharge: 2021-08-30 | Disposition: A | Discharge status: Home / Self Care | Payer: Medicare Other | Source: Ambulatory Visit | Attending: Chiropractic Medicine | Admitting: Chiropractic Medicine

## 2021-08-30 DIAGNOSIS — M25551 Pain in right hip: Secondary | ICD-10-CM

## 2021-08-30 MED ORDER — IOPAMIDOL (ISOVUE-M 200) INJECTION 41%
1.0000 mL | Freq: Once | INTRAMUSCULAR | Status: AC
  Administered 2021-08-30: 1 mL via INTRA_ARTICULAR

## 2021-08-30 MED ORDER — METHYLPREDNISOLONE ACETATE 40 MG/ML INJ SUSP (RADIOLOG
80.0000 mg | Freq: Once | INTRAMUSCULAR | Status: AC
  Administered 2021-08-30: 80 mg via INTRA_ARTICULAR

## 2021-11-07 ENCOUNTER — Telehealth: Payer: Self-pay | Admitting: Physical Medicine and Rehabilitation

## 2021-11-07 NOTE — Telephone Encounter (Signed)
Pt's son [name] called requesting aa appt for injection. Please call son [name] at [phone]. ?

## 2021-11-07 NOTE — Telephone Encounter (Signed)
Please call pt son Ricky Sandoval to set an appt. Phone number is [phone]. ?

## 2021-11-09 ENCOUNTER — Telehealth: Payer: Self-pay

## 2021-11-09 NOTE — Telephone Encounter (Signed)
Patient son called into the office and would like to set up an apt for his father to see dr Alvester Morin  ? ?Please advise  ?

## 2021-12-01 ENCOUNTER — Ambulatory Visit: Payer: Self-pay

## 2021-12-01 ENCOUNTER — Encounter: Payer: Self-pay | Admitting: Physical Medicine and Rehabilitation

## 2021-12-01 ENCOUNTER — Ambulatory Visit (INDEPENDENT_AMBULATORY_CARE_PROVIDER_SITE_OTHER): Payer: Medicare Other | Admitting: Physical Medicine and Rehabilitation

## 2021-12-01 VITALS — BP 168/64 | HR 80

## 2021-12-01 DIAGNOSIS — M1611 Unilateral primary osteoarthritis, right hip: Secondary | ICD-10-CM

## 2021-12-01 DIAGNOSIS — M25551 Pain in right hip: Secondary | ICD-10-CM

## 2021-12-01 NOTE — Progress Notes (Signed)
Pt state lower back pain. Pt state laying down makes the pain worse. Pt state he takes over the counter pain meds to help ease his pain. ? ?Numeric Pain Rating Scale and Functional Assessment ?Average Pain 2 ? ? ?In the last MONTH (on 0-10 scale) has pain interfered with the following? ? ?1. General activity like being  able to carry out your everyday physical activities such as walking, climbing stairs, carrying groceries, or moving a chair?  ?Rating(6) ? ? ?+Driver, -BT, -Dye Allergies. ? ?

## 2021-12-01 NOTE — Patient Instructions (Signed)

## 2021-12-01 NOTE — Progress Notes (Signed)
? ?[name] - 88 y.o. male MRN [id number]  Date of birth: Jan 24, 1922 ? ?Office Visit Note: ?Visit Date: 12/01/2021 ?PCP: Ricky Greathouse, MD ?Referred by: Ricky Antonio, MD ? ?Subjective: ?Chief Complaint  ?Patient presents with  ? Lower Back - Pain  ? ?HPI:  Ricky Sandoval is a 88 y.o. male who comes in todayfor possible lumbar epidural injection versus repeat intra-articular hip injection.  Patient is really a patient of Dr. Sheran Sandoval but we have been seeing him off and on over the last couple of years Ricky Sandoval the fact that he has a hard time getting into see Dr. Ethelene Sandoval and using the equipment there to get the shot done.  Patient does reside in a nursing facility.  He does have some lumbar spine degenerative changes but most of his symptoms seem to be hip related.  Speaking with him today it is low back and hip and groin.  It is worse when I move it today and rotation.  I did elect today to repeat the hip injection.  Depending on relief would look at lumbar epidural injection. ? ?ROS Otherwise per HPI. ? ?Assessment & Plan: ?Visit Diagnoses:  ?  ICD-10-CM   ?1. Pain in right hip  M25.551 XR C-ARM NO REPORT  ?  Large Joint Inj: R hip joint  ?  ?2. Unilateral primary osteoarthritis, right hip  M16.11 XR C-ARM NO REPORT  ?  Large Joint Inj: R hip joint  ?  ?  ?Plan: No additional findings.  ? ?Meds & Orders: No orders of the defined types were placed in this encounter. ?  ?Orders Placed This Encounter  ?Procedures  ? Large Joint Inj: R hip joint  ? XR C-ARM NO REPORT  ?  ?Follow-up: Return if symptoms worsen or fail to improve.  ? ?Procedures: ?Large Joint Inj: R hip joint on 12/01/2021 1:08 PM ?Indications: diagnostic evaluation and pain ?Details: 22 G 3.5 in needle, fluoroscopy-guided anterior approach ? ?Arthrogram: No ? ?Medications: 4 mL bupivacaine 0.25 %; 60 mg triamcinolone acetonide 40 MG/ML ?Outcome: tolerated well, no immediate complications ? ?There was excellent flow of contrast producing a  partial arthrogram of the hip. The patient did have relief of symptoms during the anesthetic phase of the injection. ?Procedure, treatment alternatives, risks and benefits explained, specific risks discussed. Consent was given by the patient. Immediately prior to procedure a time out was called to verify the correct patient, procedure, equipment, support staff and site/side marked as required. Patient was prepped and draped in the usual sterile fashion.  ? ?  ?   ? ?Clinical History: ?No specialty comments available.  ? ? ? ?Objective:  VS:  HT:    WT:   BMI:     BP:(!) 168/64  HR:80bpm  TEMP: ( )  RESP:  ?Physical Exam ?Vitals and nursing note reviewed.  ?Constitutional:   ?   General: He is not in acute distress. ?   Appearance: Normal appearance. He is not ill-appearing.  ?HENT:  ?   Head: Normocephalic and atraumatic.  ?   Right Ear: External ear normal.  ?   Left Ear: External ear normal.  ?   Nose: No congestion.  ?Eyes:  ?   Extraocular Movements: Extraocular movements intact.  ?Cardiovascular:  ?   Rate and Rhythm: Normal rate.  ?   Pulses: Normal pulses.  ?Pulmonary:  ?   Effort: Pulmonary effort is normal. No respiratory distress.  ?Abdominal:  ?   General: There is  no distension.  ?   Palpations: Abdomen is soft.  ?Musculoskeletal:     ?   General: No tenderness or signs of injury.  ?   Cervical back: Neck supple.  ?   Right lower leg: No edema.  ?   Left lower leg: No edema.  ?   Comments: Patient has good distal strength without clonus.  Patient has positive pain on the right with hip internal rotation and logrolling.  ?Skin: ?   Findings: No erythema or rash.  ?Neurological:  ?   General: No focal deficit present.  ?   Mental Status: He is alert and oriented to person, place, and time.  ?   Sensory: No sensory deficit.  ?   Motor: No weakness or abnormal muscle tone.  ?   Coordination: Coordination normal.  ?Psychiatric:     ?   Mood and Affect: Mood normal.     ?   Behavior: Behavior normal.  ?   ? ?Imaging: ?No results found. ?

## 2021-12-20 MED ORDER — TRIAMCINOLONE ACETONIDE 40 MG/ML IJ SUSP
60.0000 mg | INTRAMUSCULAR | Status: AC | PRN
  Administered 2021-12-01: 60 mg via INTRA_ARTICULAR

## 2021-12-20 MED ORDER — BUPIVACAINE HCL 0.25 % IJ SOLN
4.0000 mL | INTRAMUSCULAR | Status: AC | PRN
  Administered 2021-12-01: 4 mL via INTRA_ARTICULAR

## 2022-04-19 ENCOUNTER — Other Ambulatory Visit: Payer: Self-pay

## 2022-04-19 ENCOUNTER — Encounter (HOSPITAL_COMMUNITY): Payer: Self-pay | Admitting: Emergency Medicine

## 2022-04-19 ENCOUNTER — Emergency Department (HOSPITAL_COMMUNITY): Payer: Medicare Other

## 2022-04-19 ENCOUNTER — Inpatient Hospital Stay (HOSPITAL_COMMUNITY)
Admission: EM | Admit: 2022-04-19 | Discharge: 2022-04-27 | DRG: 871 | Disposition: A | Discharge status: Home Health | Payer: Medicare Other | Source: Skilled Nursing Facility | Attending: Student | Admitting: Student

## 2022-04-19 DIAGNOSIS — Z23 Encounter for immunization: Secondary | ICD-10-CM | POA: Diagnosis present

## 2022-04-19 DIAGNOSIS — E039 Hypothyroidism, unspecified: Secondary | ICD-10-CM | POA: Diagnosis present

## 2022-04-19 DIAGNOSIS — E538 Deficiency of other specified B group vitamins: Secondary | ICD-10-CM | POA: Diagnosis present

## 2022-04-19 DIAGNOSIS — K224 Dyskinesia of esophagus: Secondary | ICD-10-CM | POA: Diagnosis present

## 2022-04-19 DIAGNOSIS — I471 Supraventricular tachycardia: Secondary | ICD-10-CM | POA: Diagnosis not present

## 2022-04-19 DIAGNOSIS — Z20822 Contact with and (suspected) exposure to covid-19: Secondary | ICD-10-CM | POA: Diagnosis present

## 2022-04-19 DIAGNOSIS — D539 Nutritional anemia, unspecified: Secondary | ICD-10-CM | POA: Diagnosis present

## 2022-04-19 DIAGNOSIS — K222 Esophageal obstruction: Secondary | ICD-10-CM | POA: Diagnosis present

## 2022-04-19 DIAGNOSIS — Z7189 Other specified counseling: Secondary | ICD-10-CM

## 2022-04-19 DIAGNOSIS — A413 Sepsis due to Hemophilus influenzae: Secondary | ICD-10-CM | POA: Diagnosis present

## 2022-04-19 DIAGNOSIS — N1831 Chronic kidney disease, stage 3a: Secondary | ICD-10-CM | POA: Diagnosis present

## 2022-04-19 DIAGNOSIS — R9431 Abnormal electrocardiogram [ECG] [EKG]: Secondary | ICD-10-CM | POA: Diagnosis not present

## 2022-04-19 DIAGNOSIS — R7989 Other specified abnormal findings of blood chemistry: Secondary | ICD-10-CM | POA: Diagnosis not present

## 2022-04-19 DIAGNOSIS — N189 Chronic kidney disease, unspecified: Secondary | ICD-10-CM

## 2022-04-19 DIAGNOSIS — Z79890 Hormone replacement therapy (postmenopausal): Secondary | ICD-10-CM | POA: Diagnosis not present

## 2022-04-19 DIAGNOSIS — I10 Essential (primary) hypertension: Secondary | ICD-10-CM | POA: Diagnosis present

## 2022-04-19 DIAGNOSIS — J14 Pneumonia due to Hemophilus influenzae: Secondary | ICD-10-CM | POA: Diagnosis present

## 2022-04-19 DIAGNOSIS — Z789 Other specified health status: Secondary | ICD-10-CM

## 2022-04-19 DIAGNOSIS — Z66 Do not resuscitate: Secondary | ICD-10-CM | POA: Diagnosis present

## 2022-04-19 DIAGNOSIS — R6520 Severe sepsis without septic shock: Secondary | ICD-10-CM | POA: Diagnosis present

## 2022-04-19 DIAGNOSIS — K219 Gastro-esophageal reflux disease without esophagitis: Secondary | ICD-10-CM | POA: Diagnosis present

## 2022-04-19 DIAGNOSIS — I129 Hypertensive chronic kidney disease with stage 1 through stage 4 chronic kidney disease, or unspecified chronic kidney disease: Secondary | ICD-10-CM | POA: Diagnosis present

## 2022-04-19 DIAGNOSIS — N179 Acute kidney failure, unspecified: Secondary | ICD-10-CM | POA: Diagnosis present

## 2022-04-19 DIAGNOSIS — N400 Enlarged prostate without lower urinary tract symptoms: Secondary | ICD-10-CM | POA: Diagnosis present

## 2022-04-19 DIAGNOSIS — Z515 Encounter for palliative care: Secondary | ICD-10-CM | POA: Diagnosis not present

## 2022-04-19 DIAGNOSIS — A419 Sepsis, unspecified organism: Secondary | ICD-10-CM

## 2022-04-19 DIAGNOSIS — Z8719 Personal history of other diseases of the digestive system: Secondary | ICD-10-CM | POA: Diagnosis not present

## 2022-04-19 DIAGNOSIS — I248 Other forms of acute ischemic heart disease: Secondary | ICD-10-CM | POA: Diagnosis present

## 2022-04-19 DIAGNOSIS — E1122 Type 2 diabetes mellitus with diabetic chronic kidney disease: Secondary | ICD-10-CM | POA: Diagnosis present

## 2022-04-19 DIAGNOSIS — Z7982 Long term (current) use of aspirin: Secondary | ICD-10-CM | POA: Diagnosis not present

## 2022-04-19 DIAGNOSIS — J189 Pneumonia, unspecified organism: Secondary | ICD-10-CM

## 2022-04-19 DIAGNOSIS — R5381 Other malaise: Secondary | ICD-10-CM | POA: Diagnosis present

## 2022-04-19 DIAGNOSIS — D649 Anemia, unspecified: Secondary | ICD-10-CM | POA: Diagnosis present

## 2022-04-19 DIAGNOSIS — I4719 Other supraventricular tachycardia: Secondary | ICD-10-CM | POA: Diagnosis present

## 2022-04-19 DIAGNOSIS — Z79899 Other long term (current) drug therapy: Secondary | ICD-10-CM

## 2022-04-19 DIAGNOSIS — E7800 Pure hypercholesterolemia, unspecified: Secondary | ICD-10-CM | POA: Diagnosis present

## 2022-04-19 DIAGNOSIS — J9601 Acute respiratory failure with hypoxia: Secondary | ICD-10-CM | POA: Diagnosis present

## 2022-04-19 LAB — URINALYSIS, ROUTINE W REFLEX MICROSCOPIC
Bilirubin Urine: NEGATIVE
Glucose, UA: NEGATIVE mg/dL
Hgb urine dipstick: NEGATIVE
Ketones, ur: NEGATIVE mg/dL
Leukocytes,Ua: NEGATIVE
Nitrite: NEGATIVE
Protein, ur: 100 mg/dL — AB
Specific Gravity, Urine: 1.016 (ref 1.005–1.030)
pH: 5 (ref 5.0–8.0)

## 2022-04-19 LAB — MRSA NEXT GEN BY PCR, NASAL: MRSA by PCR Next Gen: NOT DETECTED

## 2022-04-19 LAB — COMPREHENSIVE METABOLIC PANEL
ALT: 19 U/L (ref 0–44)
AST: 31 U/L (ref 15–41)
Albumin: 3.1 g/dL — ABNORMAL LOW (ref 3.5–5.0)
Alkaline Phosphatase: 61 U/L (ref 38–126)
Anion gap: 12 (ref 5–15)
BUN: 35 mg/dL — ABNORMAL HIGH (ref 8–23)
CO2: 22 mmol/L (ref 22–32)
Calcium: 8.7 mg/dL — ABNORMAL LOW (ref 8.9–10.3)
Chloride: 105 mmol/L (ref 98–111)
Creatinine, Ser: 1.64 mg/dL — ABNORMAL HIGH (ref 0.61–1.24)
GFR, Estimated: 38 mL/min — ABNORMAL LOW (ref >60)
Glucose, Bld: 163 mg/dL — ABNORMAL HIGH (ref 70–99)
Potassium: 3.8 mmol/L (ref 3.5–5.1)
Sodium: 139 mmol/L (ref 135–145)
Total Bilirubin: 0.8 mg/dL (ref 0.3–1.2)
Total Protein: 6.7 g/dL (ref 6.5–8.1)

## 2022-04-19 LAB — CBC
HCT: 34 % — ABNORMAL LOW (ref 39.0–52.0)
Hemoglobin: 11 g/dL — ABNORMAL LOW (ref 13.0–17.0)
MCH: 34 pg (ref 26.0–34.0)
MCHC: 32.4 g/dL (ref 30.0–36.0)
MCV: 104.9 fL — ABNORMAL HIGH (ref 80.0–100.0)
Platelets: 208 10*3/uL (ref 150–400)
RBC: 3.24 MIL/uL — ABNORMAL LOW (ref 4.22–5.81)
RDW: 12.2 % (ref 11.5–15.5)
WBC: 7.3 10*3/uL (ref 4.0–10.5)
nRBC: 0 % (ref 0.0–0.2)

## 2022-04-19 LAB — LACTIC ACID, PLASMA
Lactic Acid, Venous: 2.2 mmol/L (ref 0.5–1.9)
Lactic Acid, Venous: 1.2 mmol/L (ref 0.5–1.9)

## 2022-04-19 LAB — SARS CORONAVIRUS 2 BY RT PCR: SARS Coronavirus 2 by RT PCR: NEGATIVE

## 2022-04-19 MED ORDER — LORATADINE 10 MG PO TABS
10.0000 mg | ORAL_TABLET | Freq: Every day | ORAL | Status: DC
  Administered 2022-04-20 – 2022-04-27 (×8): 10 mg via ORAL
  Filled 2022-04-19 (×8): qty 1

## 2022-04-19 MED ORDER — ACETAMINOPHEN 650 MG RE SUPP
975.0000 mg | Freq: Once | RECTAL | Status: AC
  Administered 2022-04-19: 975 mg via RECTAL
  Filled 2022-04-19: qty 2

## 2022-04-19 MED ORDER — NORTRIPTYLINE HCL 25 MG PO CAPS
25.0000 mg | ORAL_CAPSULE | Freq: Every day | ORAL | Status: DC
  Administered 2022-04-19 – 2022-04-26 (×8): 25 mg via ORAL
  Filled 2022-04-19 (×9): qty 1

## 2022-04-19 MED ORDER — DIPHENHYDRAMINE-APAP (SLEEP) 25-500 MG PO TABS: 1.0000 | ORAL_TABLET | Freq: Every day | ORAL | Status: DC

## 2022-04-19 MED ORDER — ACETAMINOPHEN 650 MG RE SUPP: 650.0000 mg | Freq: Four times a day (QID) | RECTAL | Status: DC | PRN

## 2022-04-19 MED ORDER — ASPIRIN 81 MG PO TBEC
81.0000 mg | DELAYED_RELEASE_TABLET | Freq: Every day | ORAL | Status: DC
  Administered 2022-04-20 – 2022-04-27 (×8): 81 mg via ORAL
  Filled 2022-04-19 (×8): qty 1

## 2022-04-19 MED ORDER — PANTOPRAZOLE SODIUM 40 MG PO TBEC
40.0000 mg | DELAYED_RELEASE_TABLET | Freq: Two times a day (BID) | ORAL | Status: DC
  Administered 2022-04-20 – 2022-04-27 (×15): 40 mg via ORAL
  Filled 2022-04-19 (×15): qty 1

## 2022-04-19 MED ORDER — TRIAMCINOLONE ACETONIDE 0.1 % EX OINT
1.0000 | TOPICAL_OINTMENT | Freq: Two times a day (BID) | CUTANEOUS | Status: DC
Start: 2022-04-19 — End: 2022-04-27
  Administered 2022-04-19 – 2022-04-27 (×14): 1 via TOPICAL
  Filled 2022-04-19 (×2): qty 15

## 2022-04-19 MED ORDER — SIMVASTATIN 20 MG PO TABS
20.0000 mg | ORAL_TABLET | Freq: Every day | ORAL | Status: DC
  Administered 2022-04-19 – 2022-04-26 (×8): 20 mg via ORAL
  Filled 2022-04-19 (×8): qty 1

## 2022-04-19 MED ORDER — TAMSULOSIN HCL 0.4 MG PO CAPS
0.4000 mg | ORAL_CAPSULE | Freq: Every day | ORAL | Status: DC
  Administered 2022-04-19 – 2022-04-26 (×8): 0.4 mg via ORAL
  Filled 2022-04-19 (×8): qty 1

## 2022-04-19 MED ORDER — METOPROLOL SUCCINATE ER 25 MG PO TB24
25.0000 mg | ORAL_TABLET | Freq: Every day | ORAL | Status: DC
  Administered 2022-04-20: 25 mg via ORAL
  Filled 2022-04-19: qty 1

## 2022-04-19 MED ORDER — POLYVINYL ALCOHOL 1.4 % OP SOLN
2.0000 [drp] | Freq: Two times a day (BID) | OPHTHALMIC | Status: DC
Start: 2022-04-19 — End: 2022-04-27
  Administered 2022-04-19 – 2022-04-27 (×16): 2 [drp] via OPHTHALMIC
  Filled 2022-04-19: qty 15

## 2022-04-19 MED ORDER — DOCUSATE SODIUM 100 MG PO CAPS
300.0000 mg | ORAL_CAPSULE | Freq: Every day | ORAL | Status: DC
  Administered 2022-04-20 – 2022-04-22 (×3): 300 mg via ORAL
  Filled 2022-04-19 (×3): qty 3

## 2022-04-19 MED ORDER — LEVOTHYROXINE SODIUM 75 MCG PO TABS
75.0000 ug | ORAL_TABLET | Freq: Every day | ORAL | Status: DC
Start: 2022-04-20 — End: 2022-04-27
  Administered 2022-04-20 – 2022-04-27 (×8): 75 ug via ORAL
  Filled 2022-04-19 (×9): qty 1

## 2022-04-19 MED ORDER — MENTHOL 3 MG MT LOZG: 1.0000 | LOZENGE | OROMUCOSAL | Status: DC | PRN

## 2022-04-19 MED ORDER — VANCOMYCIN HCL 1500 MG/300ML IV SOLN
1500.0000 mg | Freq: Once | INTRAVENOUS | Status: AC
  Administered 2022-04-19: 1500 mg via INTRAVENOUS
  Filled 2022-04-19: qty 300

## 2022-04-19 MED ORDER — FLUTICASONE PROPIONATE 50 MCG/ACT NA SUSP
1.0000 | Freq: Every morning | NASAL | Status: DC
  Administered 2022-04-20 – 2022-04-27 (×8): 1 via NASAL
  Filled 2022-04-19: qty 16

## 2022-04-19 MED ORDER — SUCRALFATE 1 G PO TABS
1.0000 g | ORAL_TABLET | Freq: Two times a day (BID) | ORAL | Status: DC
  Administered 2022-04-19 – 2022-04-27 (×16): 1 g via ORAL
  Filled 2022-04-19 (×16): qty 1

## 2022-04-19 MED ORDER — ONDANSETRON HCL 4 MG PO TABS: 4.0000 mg | ORAL_TABLET | Freq: Four times a day (QID) | ORAL | Status: DC | PRN

## 2022-04-19 MED ORDER — LIP MEDEX EX OINT
TOPICAL_OINTMENT | CUTANEOUS | Status: DC | PRN
  Administered 2022-04-20: 75 via TOPICAL
  Filled 2022-04-19 (×2): qty 7

## 2022-04-19 MED ORDER — VANCOMYCIN VARIABLE DOSE PER UNSTABLE RENAL FUNCTION (PHARMACIST DOSING): Status: DC

## 2022-04-19 MED ORDER — ENOXAPARIN SODIUM 40 MG/0.4ML IJ SOSY
40.0000 mg | PREFILLED_SYRINGE | INTRAMUSCULAR | Status: DC
  Administered 2022-04-19 – 2022-04-26 (×8): 40 mg via SUBCUTANEOUS
  Filled 2022-04-19 (×9): qty 0.4

## 2022-04-19 MED ORDER — ACETAMINOPHEN 500 MG PO TABS: 1000.0000 mg | ORAL_TABLET | Freq: Once | ORAL | Status: DC

## 2022-04-19 MED ORDER — ONDANSETRON HCL 4 MG/2ML IJ SOLN: 4.0000 mg | Freq: Four times a day (QID) | INTRAMUSCULAR | Status: DC | PRN

## 2022-04-19 MED ORDER — DILTIAZEM LOAD VIA INFUSION
10.0000 mg | Freq: Once | INTRAVENOUS | Status: AC
  Administered 2022-04-19: 10 mg via INTRAVENOUS
  Filled 2022-04-19: qty 10

## 2022-04-19 MED ORDER — DILTIAZEM HCL-DEXTROSE 125-5 MG/125ML-% IV SOLN (PREMIX)
5.0000 mg/h | INTRAVENOUS | Status: DC
  Administered 2022-04-19: 5 mg/h via INTRAVENOUS
  Filled 2022-04-19: qty 125

## 2022-04-19 MED ORDER — SODIUM CHLORIDE 0.9 % IV SOLN: INTRAVENOUS | Status: DC

## 2022-04-19 MED ORDER — ACETAMINOPHEN 325 MG PO TABS
650.0000 mg | ORAL_TABLET | Freq: Four times a day (QID) | ORAL | Status: DC | PRN
  Filled 2022-04-19: qty 2

## 2022-04-19 MED ORDER — SODIUM CHLORIDE 0.9 % IV SOLN
500.0000 mg | INTRAVENOUS | Status: DC
  Administered 2022-04-19: 500 mg via INTRAVENOUS
  Filled 2022-04-19 (×2): qty 5

## 2022-04-19 MED ORDER — KETOTIFEN FUMARATE 0.025 % OP SOLN
1.0000 [drp] | Freq: Three times a day (TID) | OPHTHALMIC | Status: DC
  Administered 2022-04-19 – 2022-04-27 (×22): 1 [drp] via OPHTHALMIC
  Filled 2022-04-19: qty 5

## 2022-04-19 MED ORDER — SODIUM CHLORIDE 0.9 % IV SOLN
2.0000 g | INTRAVENOUS | Status: DC
  Administered 2022-04-19: 2 g via INTRAVENOUS
  Filled 2022-04-19 (×2): qty 20

## 2022-04-19 MED ORDER — LEVALBUTEROL HCL 0.63 MG/3ML IN NEBU: 0.6300 mg | INHALATION_SOLUTION | Freq: Four times a day (QID) | RESPIRATORY_TRACT | Status: DC | PRN

## 2022-04-19 MED ORDER — SODIUM CHLORIDE 0.9 % IV SOLN
2.0000 g | Freq: Once | INTRAVENOUS | Status: AC
  Administered 2022-04-19: 2 g via INTRAVENOUS
  Filled 2022-04-19: qty 12.5

## 2022-04-19 MED ORDER — AMLODIPINE BESYLATE 2.5 MG PO TABS
2.5000 mg | ORAL_TABLET | Freq: Every day | ORAL | Status: DC
  Administered 2022-04-20: 2.5 mg via ORAL
  Filled 2022-04-19: qty 1

## 2022-04-19 MED ORDER — SODIUM CHLORIDE 0.9 % IV BOLUS
1000.0000 mL | Freq: Once | INTRAVENOUS | Status: AC
Start: 2022-04-19 — End: 2022-04-19
  Administered 2022-04-19: 1000 mL via INTRAVENOUS

## 2022-04-19 NOTE — Consult Note (Signed)
Consultation Note Date: 04/19/2022   Patient Name: Ricky Sandoval  DOB: [birth year]  MRN: [id number]  Age / Sex: 88 y.o., male  PCP: [name], MD Referring Physician: Lajean Saver, MD  Reason for Consultation: Establishing goals of care, "sick"  HPI/Patient Profile: 88 y.o. male  with past medical history of hypertension hyperlipidemia, diabetes mellitus type 2, CKD 3a, and hypothyroidism presented to ED on 04/19/22 from [facility] with staff concerns of cough and mucus. Patient was admitted on 04/19/2022 with sepsis secondary to pneumonia, AKI on CKD 3a, narrow complex tachycardia.   Clinical Assessment and Goals of Care: I have reviewed medical records including EPIC notes, labs, and imaging. Received report from primary RN - no acute concerns. RN reports patient is "coming around," able to now answer simple questions.    Went to visit patient at bedside - no family/visitors present. Patient was lying in bed awake, alert, oriented to self and year only, and able to participate in simple conversation. No signs or non-verbal gestures of pain or discomfort noted. No respiratory distress, increased work of breathing, or secretions noted. He is on 2L O2 . Cough noted along with sputum on sheets. When I mention Butch Penny and Jerrye Beavers, he is able to tell me they are his children.   3:21 PM Attempted to call son/Marty to discuss diagnosis, prognosis, GOC, EOL wishes, disposition, and options - no answer - confidential voicemail left and PMT phone number provided with request to return call.  3:23 PM Met with daughter/Donna via phone  to discuss diagnosis, prognosis, GOC, EOL wishes, disposition, and options.  I introduced Palliative Medicine as specialized medical care for people living with serious illness. It focuses on providing relief from the symptoms and stress of a serious illness. The goal is to improve  quality of life for both the patient and the family.  We discussed a brief life review of the patient as well as functional and nutritional status. Patient's wife passed away in 07-Nov-2011 - he had 2 sons and 1 daughter. Prior to hospitalization, patient was at [facility] where he has lived for 1.5 years. Butch Penny tells me when patient moved in, he could walk; however, "lately" he cannot walk and has to use a wheelchair. He also requires assistance with all dressing and bathing. Butch Penny reports patient's appetite as "good" when she saw him in July. Albumin noted 3.1 on 04/19/22. Butch Penny is not able to drive, so is not able to visit him often. Butch Penny tells me patient is often "forgetful."  Butch Penny tells me Jerrye Beavers is the patient's HCPOA.   We discussed patient's current illness and what it means in the larger context of patient's on-going co-morbidities.  Natural disease trajectory and expectations at EOL were discussed. I attempted to elicit values and goals of care important to the patient. The difference between aggressive medical intervention and comfort care was considered in light of the patient's goals of care.   Encouraged Butch Penny to consider DNR/DNI status understanding evidenced based poor outcomes in similar hospitalized patient, as the cause of arrest is likely associated with advanced chronic/terminal illness rather than an easily reversible acute cardio-pulmonary event. I explained that DNR/DNI does not change the medical plan and it only comes into effect after a person has arrested (died).  It is a protective measure to keep [place] from harming the patient in their last moments of life. Butch Penny does feel DNR/DNI is best - she agrees this also needs to be discussed with Jerrye Beavers since  he is patient's legal [place]. She feels her two brothers would be agreeable to this as patient would not want aggressive interventions such as CPR, defibrillation, ACLS medications, or intubation.   She appreciates the medical updates  provided and discussion around [place]. Butch Penny states that Jerrye Beavers is on vacation in [place] with intermittent cellular service. She encourages me to continue trying to reach [place] if the first call does not go through.   Questions and concerns were addressed. The patient/family was encouraged to call with questions and/or concerns. PMT number was provided.  4:01 PM Received notification Jerrye Beavers returned call.   4:06 PM Lockie Mola and discussed all information again as outlined above. He is appreciative of updates on patient and confirms he is in [place] with intermitted cellular service. Goal at this time is to treat the treatable.   Encouraged Jerrye Beavers to consider DNR/DNI as outlined above.  He was agreeable to DNR/DNI with understanding that patient would not receive CPR, defibrillation, ACLS medications, or intubation in the event of a cardiac and/or respiratory arrest.   Discussed with family the importance of continued conversation with each other and the medical providers regarding overall plan of care and treatment options, ensuring decisions are within the context of the patient's values and GOCs.    Questions and concerns were addressed. The patient/family was encouraged to call with questions and/or concerns. PMT number was provided.   Primary Decision Maker: HCPOA - son/Marty Hoobler    SUMMARY OF RECOMMENDATIONS   Continue to treat the treatable Now DNR/DNI - durable DNR form completed and placed at patient's bedside in ED. Copy was made and will be scanned into Vyn[facility]/ACP tab PMT will continue to follow and support holistically   Code Status/Advance Care Planning: DNR  Palliative Prophylaxis:  Aspiration, Bowel Regimen, Delirium Protocol, Frequent Pain Assessment, Oral Care, and Turn Reposition  Additional Recommendations (Limitations, Scope, Preferences): Full Scope Treatment and No Tracheostomy  Psycho-social/Spiritual:  Desire for further Chaplaincy support:no Created space  and opportunity for patient and family to express thoughts and feelings regarding patient's current medical situation.  Emotional support and therapeutic listening provided.  Prognosis:  Unable to determine  Discharge Planning: To Be Determined      Primary Diagnoses: Present on Admission: **None**   I have reviewed the medical record, interviewed the patient and family, and examined the patient. The following aspects are pertinent.  Past Medical History:  Diagnosis Date   Diabetes type 2, controlled ([place])    High cholesterol    Hypertension    Hypothyroid    Social History   Socioeconomic History   Marital status: Widowed    Spouse name: Not on file   Number of children: Not on file   Years of education: Not on file   Highest education level: Not on file  Occupational History   Not on file  Tobacco Use   Smoking status: Never   Smokeless tobacco: Never  Vaping Use   Vaping Use: Never used  Substance and Sexual Activity   Alcohol use: Not Currently   Drug use: Never   Sexual activity: Not on file  Other Topics Concern   Not on file  Social History Narrative   Not on file   Social Determinants of Health   Financial Resource Strain: Not on file  Food Insecurity: Not on file  Transportation Needs: Not on file  Physical Activity: Not on file  Stress: Not on file  Social Connections: Not on file   No family history  on file. Scheduled Meds: Continuous Infusions:  diltiazem (CARDIZEM) infusion Stopped (04/19/22 1440)   vancomycin 1,500 mg (04/19/22 1417)   PRN Meds:. Medications Prior to Admission:  Prior to Admission medications   Medication Sig Start Date End Date Taking? Authorizing Provider  acetaminophen (TYLENOL) 500 MG tablet Take 1,000 mg by mouth 2 (two) times daily.   Yes [provider]  amLODipine (NORVASC) 2.5 MG tablet Take 2.5 mg by mouth daily.   Yes [provider]  aspirin 81 MG tablet Take 81 mg by mouth daily.   Yes  [provider]  carboxymethylcellulose (REFRESH PLUS) 0.5 % SOLN Place 2 drops into both eyes 2 (two) times daily.   Yes [provider]  cetirizine (ZYRTEC) 10 MG tablet Take 10 mg by mouth daily.   Yes [provider]  Cholecalciferol (VITAMIN D-3) 25 MCG (1000 UT) CAPS Take 2,000 Units by mouth daily.   Yes [provider]  diphenhydramine-acetaminophen (TYLENOL PM) 25-500 MG TABS tablet Take 1 tablet by mouth at bedtime.   Yes [provider]  docusate sodium (COLACE) 100 MG capsule Take 300 mg by mouth daily.   Yes [provider]  flunisolide (NASALIDE) 25 MCG/ACT (0.025%) SOLN Place 1 spray into the nose daily.   Yes [provider]  fluticasone (FLONASE) 50 MCG/ACT nasal spray Place 1 spray into both nostrils in the morning.   Yes [provider]  guaiFENesin (MUCINEX CHILDRENS PO) Take 20 mLs by mouth 2 (two) times daily. For 7 days. Starting 04/14/22   Yes [provider]  ketotifen (ZADITOR) 0.025 % ophthalmic solution Place 1 drop into both eyes 3 (three) times daily.   Yes [provider]  levothyroxine (SYNTHROID, LEVOTHROID) 75 MCG tablet Take 75 mcg by mouth daily before breakfast.   Yes [provider]  loperamide (ANTI-DIARRHEAL) 2 MG tablet Take 2 mg by mouth See admin instructions. 2 mg after each loose stool. Max 6 mg in 24 hours   Yes [provider]  loratadine (CLARITIN) 10 MG tablet Take 10 mg by mouth daily. For 7 days. Starting 04/14/22   Yes [provider]  metoprolol succinate (TOPROL-XL) 25 MG 24 hr tablet Take 25 mg by mouth daily.   Yes [provider]  Multiple Vitamins-Minerals (PRESERVISION AREDS 2) CHEW Chew 2 tablets by mouth daily.   Yes [provider]  nortriptyline (PAMELOR) 25 MG capsule Take 25 mg by mouth at bedtime.   Yes [provider]  OVER THE COUNTER MEDICATION Apply 1 application  topically See admin  instructions. [facility] Protect 1%-12% cream. Apply to buttocks after each incontinent episode or urinating bed as needed.   Yes [provider]  pantoprazole (PROTONIX) 40 MG tablet Take 1 tablet (40 mg total) by mouth 2 (two) times daily before a meal. 02/10/21 06/03/22 Yes Aline August, MD  simvastatin (ZOCOR) 40 MG tablet Take 20 mg by mouth at bedtime.   Yes [provider]  sucralfate (CARAFATE) 1 g tablet Take 1 g by mouth 2 (two) times daily with a meal.   Yes [provider]  tamsulosin (FLOMAX) 0.4 MG CAPS capsule Take 0.4 mg by mouth at bedtime.   Yes [provider]  triamcinolone ointment (KENALOG) 0.1 % Apply 1 Application topically 2 (two) times daily. To affected area on left side below left buttocks. For 14 days. Starting 04/06/22   Yes [provider]  vitamin C (ASCORBIC ACID) 500 MG tablet Take 500 mg by  mouth daily.   Yes [provider]   Allergies  Allergen Reactions   Zestril [Lisinopril] Other (See Comments)    Airway constriction   Review of Systems  Physical Exam  Vital Signs: BP (!) 96/52   Pulse 97   Temp (!) 104 F (40 C) (Rectal)   Resp 18   SpO2 93%  Pain Scale: 0-10   Pain Score: 0-No pain   SpO2: SpO2: 93 % O2 Device:SpO2: 93 % O2 Flow Rate: .   IO: Intake/output summary: No intake or output data in the 24 hours ending 04/19/22 1459  LBM:   Baseline Weight:   Most recent weight:       Palliative Assessment/Data: 40%     Time In-Out: 1500-1555/1600-1645 Time Total: 100 minutes  Greater than 50%  of this time was spent counseling and coordinating care related to the above assessment and plan.  Signed by: Lin Landsman, NP   Please contact Palliative Medicine Team phone at [phone] for questions and concerns.  For individual provider: See Amion  *Portions of this note are a verbal dictation therefore any spelling and/or grammatical errors are due to the "[place] [facility]" system  interpretation.

## 2022-04-19 NOTE — Progress Notes (Signed)
Pharmacy Antibiotic Note  Ricky Sandoval is a 88 y.o. male for which pharmacy has been consulted for vancomycin dosing for pneumonia.  Patient with a history of HTN, HLD, T2DM, hypothyroidism. Patient presenting with cough.  SCr 1.64 - above baseline WBC 7.3; LA 1.2; T 98.6; HR 77; RR 21 MRSA PCR - not detected  Plan: Vancomycin 1500 mg once, subsequent dosing as indicated per random vancomycin level until renal function stable and/or improved, at which time scheduled dosing can be considered Consider d/c vanco as MRSA PCR negative Trend WBC, Fever, Renal function, & Clinical course F/u cultures, clinical course, WBC, fever De-escalate when able     Temp (24hrs), Avg:102.1 F (38.9 C), Min:100.2 F (37.9 C), Max:104 F (40 C)  No results for input(s): "WBC", "CREATININE", "LATICACIDVEN", "VANCOTROUGH", "VANCOPEAK", "VANCORANDOM", "GENTTROUGH", "GENTPEAK", "GENTRANDOM", "TOBRATROUGH", "TOBRAPEAK", "TOBRARND", "AMIKACINPEAK", "AMIKACINTROU", "AMIKACIN" in the last 168 hours.  CrCl cannot be calculated (Patient's most recent lab result is older than the maximum 21 days allowed.).    Allergies  Allergen Reactions   Zestril [Lisinopril] Other (See Comments)    Airway constriction    Antimicrobials this admission: Cefepime x 1 in ED 8/23 vancomycin 8/23 >>  rocephin 8/23 >>  azith 8/23 >>   Microbiology results: Pending  Thank you for allowing pharmacy to be a part of this patient's care.  Delmar Landau, PharmD, BCPS 04/19/2022 1:24 PM ED Clinical Pharmacist -  [phone]

## 2022-04-19 NOTE — ED Notes (Signed)
ED TO INPATIENT HANDOFF REPORT  ED Nurse Name and Phone #: [phone]  S Name/Age/Gender Ricky Sandoval 88 y.o. male Room/Bed: 032C/032C  Code Status   Code Status: DNR  Home/SNF/Other Home Patient oriented to: self Is this baseline? No   Triage Complete: Triage complete  Chief Complaint Sepsis [facility]) [A41.9]  Triage Note Pt BIB [facility] from [facility] for cough. Cough started after breakfast this AM, staff noticed mucus and food coming up, concern for food getting stuck in throat. Pt endorses sore throat. Denies other complaints. Staff reports pt is at his baseline mental status. EMS VS- 134/60, HR 88, RR 18, 92%   Allergies Allergies  Allergen Reactions   Zestril [Lisinopril] Other (See Comments)    Airway constriction    Level of Care/Admitting Diagnosis ED Disposition     ED Disposition  Admit   Condition  --   Comment  Hospital Area: [facility] [[id number]]  Level of Care: Progressive [102]  Admit to Progressive based on following criteria: CARDIOVASCULAR & THORACIC of moderate stability with acute coronary syndrome symptoms/low risk myocardial infarction/hypertensive urgency/arrhythmias/heart failure potentially compromising stability and stable post cardiovascular intervention patients.  May admit patient to [facility] or [facility] if equivalent level of care is available:: No  Covid Evaluation: Asymptomatic - no recent exposure (last 10 days) testing not required  Diagnosis: Sepsis [facility]) [[id number]]  Admitting Physician: Clydie Braun [[id number]]  Attending Physician: Clydie Braun [[id number]]  Certification:: I certify this patient will need inpatient services for at least 2 midnights  Estimated Length of Stay: 4          B Medical/Surgery History Past Medical History:  Diagnosis Date   Diabetes type 2, controlled (HCC)    High cholesterol    Hypertension    Hypothyroid    Past Surgical History:  Procedure  Laterality Date   BALLOON DILATION N/A 01/30/2021   Procedure: BALLOON DILATION;  Surgeon: Hilarie Fredrickson, MD;  Location: [facility] ENDOSCOPY;  Service: Endoscopy;  Laterality: N/A;   BIOPSY  01/30/2021   Procedure: BIOPSY;  Surgeon: Hilarie Fredrickson, MD;  Location: [facility] ENDOSCOPY;  Service: Endoscopy;;   BIOPSY  02/01/2021   Procedure: BIOPSY;  Surgeon: Meryl Dare, MD;  Location[facility] ENDOSCOPY;  Service: Endoscopy;;   BIOPSY  02/08/2021   Procedure: BIOPSY;  Surgeon: Tressia Danas, MD;  Location: [facility] ENDOSCOPY;  Service: Gastroenterology;;   ESOPHAGOGASTRODUODENOSCOPY N/A 02/01/2021   Procedure: ESOPHAGOGASTRODUODENOSCOPY (EGD);  Surgeon: Meryl Dare, MD;  Location[facility] ENDOSCOPY;  Service: Endoscopy;  Laterality: N/A;   ESOPHAGOGASTRODUODENOSCOPY (EGD) WITH PROPOFOL N/A 01/30/2021   Procedure: ESOPHAGOGASTRODUODENOSCOPY (EGD) WITH PROPOFOL;  Surgeon: Hilarie Fredrickson, MD;  Location: [facility] ENDOSCOPY;  Service: Endoscopy;  Laterality: N/A;   ESOPHAGOGASTRODUODENOSCOPY (EGD) WITH PROPOFOL N/A 02/08/2021   Procedure: ESOPHAGOGASTRODUODENOSCOPY (EGD) WITH PROPOFOL;  Surgeon: Tressia Danas, MD;  Location: [facility] ENDOSCOPY;  Service: Gastroenterology;  Laterality: N/A;   FOREIGN BODY REMOVAL  01/30/2021   Procedure: FOREIGN BODY REMOVAL;  Surgeon: Hilarie Fredrickson, MD;  Location: [facility] ENDOSCOPY;  Service: Endoscopy;;   SAVORY DILATION N/A 02/01/2021   Procedure: Gaspar Bidding DILATION;  Surgeon: Meryl Dare, MD;  Location: [facility] ENDOSCOPY;  Service: Endoscopy;  Laterality: N/A;     A IV Location/Drains/Wounds Patient Lines/Drains/Airways Status     Active Line/Drains/Airways     Name Placement date Placement time Site Days   Peripheral IV 04/19/22 20 G Anterior;Left Forearm 04/19/22  1151  Forearm  less than  1   Peripheral IV 04/19/22 20 G Anterior;Right Forearm 04/19/22  1327  Forearm  less than 1            Intake/Output Last 24 hours No intake or output data in the 24 hours ending 04/19/22  1809  Labs/Imaging Results for orders placed or performed during the hospital encounter of 04/19/22 (from the past 48 hour(s))  CBC     Status: Abnormal   Collection Time: 04/19/22 11:50 AM  Result Value Ref Range   WBC 7.3 4.0 - 10.5 K/uL   RBC 3.24 (L) 4.22 - 5.81 MIL/uL   Hemoglobin 11.0 (L) 13.0 - 17.0 g/dL   HCT 44.0 (L) 34.7 - 42.5 %   MCV 104.9 (H) 80.0 - 100.0 fL   MCH 34.0 26.0 - 34.0 pg   MCHC 32.4 30.0 - 36.0 g/dL   RDW 95.6 38.7 - 56.4 %   Platelets 208 150 - 400 K/uL   nRBC 0.0 0.0 - 0.2 %    Comment: Performed at [facility], [address] [place], [place] [address]  Comprehensive metabolic panel     Status: Abnormal   Collection Time: 04/19/22 11:50 AM  Result Value Ref Range   Sodium 139 135 - 145 mmol/L   Potassium 3.8 3.5 - 5.1 mmol/L   Chloride 105 98 - 111 mmol/L   CO2 22 22 - 32 mmol/L   Glucose, Bld 163 (H) 70 - 99 mg/dL    Comment: Glucose reference range applies only to samples taken after fasting for at least 8 hours.   BUN 35 (H) 8 - 23 mg/dL   Creatinine, Ser 1.88 (H) 0.61 - 1.24 mg/dL   Calcium 8.7 (L) 8.9 - 10.3 mg/dL   Total Protein 6.7 6.5 - 8.1 g/dL   Albumin 3.1 (L) 3.5 - 5.0 g/dL   AST 31 15 - 41 U/L   ALT 19 0 - 44 U/L   Alkaline Phosphatase 61 38 - 126 U/L   Total Bilirubin 0.8 0.3 - 1.2 mg/dL   GFR, Estimated 38 (L) >60 mL/min    Comment: (NOTE) Calculated using the CKD-EPI Creatinine Equation (2021)    Anion gap 12 5 - 15    Comment: Performed at [facility], [address], [place], [place] [address]  SARS Coronavirus 2 by RT PCR (hospital order, performed in [facility] hospital lab) *cepheid single result test* Anterior Nasal Swab     Status: None   Collection Time: 04/19/22 12:14 PM   Specimen: Anterior Nasal Swab  Result Value Ref Range   SARS Coronavirus 2 by RT PCR NEGATIVE NEGATIVE    Comment: (NOTE) SARS-CoV-2 target nucleic acids are NOT DETECTED.  The SARS-CoV-2 RNA is generally detectable in upper and  lower respiratory specimens during the acute phase of infection. The lowest concentration of SARS-CoV-2 viral copies this assay can detect is 250 copies / mL. A negative result does not preclude SARS-CoV-2 infection and should not be used as the sole basis for treatment or other patient management decisions.  A negative result may occur with improper specimen collection / handling, submission of specimen other than nasopharyngeal swab, presence of viral mutation(s) within the areas targeted by this assay, and inadequate number of viral copies (<250 copies / mL). A negative result must be combined with clinical observations, patient history, and epidemiological information.  Fact Sheet for Patients:   RoadLapTop.co.za  Fact Sheet for Healthcare Providers: http://kim-miller.com/  This test is not yet approved  or  cleared by the [place] and has been authorized for detection and/or diagnosis of SARS-CoV-2 by FDA under an Emergency Use Authorization (EUA).  This EUA will remain in effect (meaning this test can be used) for the duration of the COVID-19 declaration under Section 564(b)(1) of the Act, 21 U.S.C. section 360bbb-3(b)(1), unless the authorization is terminated or revoked sooner.  Performed at [facility], [address], [place], [place] [address]   Lactic acid, plasma     Status: Abnormal   Collection Time: 04/19/22  1:11 PM  Result Value Ref Range   Lactic Acid, Venous 2.2 (HH) 0.5 - 1.9 mmol/L    Comment: CRITICAL RESULT CALLED TO, READ BACK BY AND VERIFIED WITH HONEY VANKRETSCHMER RN 04/19/22 @1407  BY J. WHITE Performed at [facility], [address] [place], [place]   MRSA Next Gen by PCR, Nasal     Status: None   Collection Time: 04/19/22  1:24 PM   Specimen: Nasal Mucosa; Nasal Swab  Result Value Ref Range   MRSA by PCR Next Gen NOT DETECTED NOT DETECTED    Comment: (NOTE) The GeneXpert  MRSA Assay (FDA approved for NASAL specimens only), is one component of a comprehensive MRSA colonization surveillance program. It is not intended to diagnose MRSA infection nor to guide or monitor treatment for MRSA infections. Test performance is not FDA approved in patients less than 30 years old. Performed at [facility], [address] [place], [place]   Urinalysis, Routine w reflex microscopic Urine, Clean Catch     Status: Abnormal   Collection Time: 04/19/22  2:09 PM  Result Value Ref Range   Color, Urine YELLOW YELLOW   APPearance HAZY (A) CLEAR   Specific Gravity, Urine 1.016 1.005 - 1.030   pH 5.0 5.0 - 8.0   Glucose, UA NEGATIVE NEGATIVE mg/dL   Hgb urine dipstick NEGATIVE NEGATIVE   Bilirubin Urine NEGATIVE NEGATIVE   Ketones, ur NEGATIVE NEGATIVE mg/dL   Protein, ur 04/21/22 (A) NEGATIVE mg/dL   Nitrite NEGATIVE NEGATIVE   Leukocytes,Ua NEGATIVE NEGATIVE   RBC / HPF 0-5 0 - 5 RBC/hpf   WBC, UA 0-5 0 - 5 WBC/hpf   Bacteria, UA RARE (A) NONE SEEN   Squamous Epithelial / LPF 0-5 0 - 5    Comment: Performed at [facility], [address], [place], [place]  Lactic acid, plasma     Status: None   Collection Time: 04/19/22  3:27 PM  Result Value Ref Range   Lactic Acid, Venous 1.2 0.5 - 1.9 mmol/L    Comment: Performed at [facility], [address], [place], [place]   DG Chest 2 View  Result Date: 04/19/2022 CLINICAL DATA:  Cough. EXAM: CHEST - 2 VIEW COMPARISON:  January 30, 2021. FINDINGS: Stable cardiomediastinal silhouette. Right lung is clear. Left lower lobe airspace opacity is noted concerning for pneumonia. Bony thorax is unremarkable. IMPRESSION: Left lower lobe airspace opacity is noted concerning for pneumonia. Followup PA and lateral chest X-ray is recommended in 3-4 weeks following trial of antibiotic therapy to ensure resolution and exclude underlying malignancy. Electronically Signed   By: February 01, 2021 M.D.   On:  04/19/2022 12:55    Pending Labs Unresulted Labs (From admission, onward)     Start     Ordered   04/20/22 0500  CBC  Tomorrow morning,   R  04/19/22 1721   04/20/22 0500  Basic metabolic panel  Tomorrow morning,   R        04/19/22 1721   04/19/22 1717  MRSA Next Gen by PCR, Nasal  (COPD / Pneumonia / Cellulitis / Lower Extremity Wound)  Once,   R        04/19/22 1721   04/19/22 1716  Expectorated Sputum Assessment w Gram Stain, Rflx to Resp Cult  (COPD / Pneumonia / Cellulitis / Lower Extremity Wound)  Once,   R        04/19/22 1721   04/19/22 1716  Legionella Pneumophila Serogp 1 Ur Ag  (COPD / Pneumonia / Cellulitis / Lower Extremity Wound)  Once,   R        04/19/22 1721   04/19/22 1716  Strep pneumoniae urinary antigen  (COPD / Pneumonia / Cellulitis / Lower Extremity Wound)  Once,   R        04/19/22 1721   04/19/22 1311  Blood culture (routine x 2)  BLOOD CULTURE X 2,   R (with STAT occurrences)      04/19/22 1310            Vitals/Pain Today's Vitals   04/19/22 1558 04/19/22 1615 04/19/22 1630 04/19/22 1715  BP:  (!) 113/46 99/65 (!) 116/44  Pulse: 77 76 73 72  Resp: 19 20 18  (!) 21  Temp:      TempSrc:      SpO2: 92% 91% 91% 94%  PainSc:        Isolation Precautions No active isolations  Medications Medications  aspirin EC tablet 81 mg (has no administration in time range)  metoprolol succinate (TOPROL-XL) 24 hr tablet 25 mg (has no administration in time range)  amLODipine (NORVASC) tablet 2.5 mg (has no administration in time range)  simvastatin (ZOCOR) tablet 20 mg (has no administration in time range)  nortriptyline (PAMELOR) capsule 25 mg (has no administration in time range)  levothyroxine (SYNTHROID) tablet 75 mcg (has no administration in time range)  sucralfate (CARAFATE) tablet 1 g (has no administration in time range)  pantoprazole (PROTONIX) EC tablet 40 mg (has no administration in time range)  docusate sodium (COLACE) capsule 300 mg  (has no administration in time range)  tamsulosin (FLOMAX) capsule 0.4 mg (has no administration in time range)  fluticasone (FLONASE) 50 MCG/ACT nasal spray 1 spray (has no administration in time range)  loratadine (CLARITIN) tablet 10 mg (has no administration in time range)  carboxymethylcellulose (REFRESH PLUS) 0.5 % ophthalmic solution 2 drop (has no administration in time range)  ketotifen (ZADITOR) 0.025 % ophthalmic solution 1 drop (has no administration in time range)  triamcinolone ointment (KENALOG) 0.1 % 1 Application (has no administration in time range)  enoxaparin (LOVENOX) injection 40 mg (has no administration in time range)  azithromycin (ZITHROMAX) 500 mg in sodium chloride 0.9 % 250 mL IVPB (has no administration in time range)  cefTRIAXone (ROCEPHIN) 2 g in sodium chloride 0.9 % 100 mL IVPB (has no administration in time range)  acetaminophen (TYLENOL) tablet 650 mg (has no administration in time range)    Or  acetaminophen (TYLENOL) suppository 650 mg (has no administration in time range)  0.9 %  sodium chloride infusion (has no administration in time range)  ondansetron (ZOFRAN) tablet 4 mg (has no administration in time range)    Or  ondansetron (ZOFRAN) injection 4 mg (has no administration in time range)  levalbuterol (XOPENEX) nebulizer solution 0.63 mg (has  no administration in time range)  diltiazem (CARDIZEM) 1 mg/mL load via infusion 10 mg (10 mg Intravenous Bolus from Bag 04/19/22 1350)  ceFEPIme (MAXIPIME) 2 g in sodium chloride 0.9 % 100 mL IVPB (0 g Intravenous Stopped 04/19/22 1417)  acetaminophen (TYLENOL) suppository 975 mg (975 mg Rectal Given 04/19/22 1339)  vancomycin (VANCOREADY) IVPB 1500 mg/300 mL (0 mg Intravenous Stopped 04/19/22 1651)  sodium chloride 0.9 % bolus 1,000 mL (0 mLs Intravenous Stopped 04/19/22 1450)    Mobility  High fall risk   Focused Assessments    R Recommendations: See Admitting Provider Note  Report given to:    Additional Notes:

## 2022-04-19 NOTE — ED Notes (Signed)
Patient transported to X-ray 

## 2022-04-19 NOTE — ED Notes (Signed)
Pt noted to be tachycardic 150s after returning form x ray; EKG done, Dr. Denton Lank notified

## 2022-04-19 NOTE — ED Notes (Signed)
Per Deanna Artis, med tech at [employer], pt more altered than normal today; normally able to carry on conversation, answer questions appropriately; denies sick contacts; denies noted urinary symptoms

## 2022-04-19 NOTE — ED Provider Notes (Signed)
[facility] EMERGENCY DEPARTMENT Provider Note   CSN: [id number] Arrival date & time: 04/19/22  1130     History  Chief Complaint  Patient presents with   Cough    Ricky Sandoval is a 88 y.o. male.  Patient [facility] via EMS from [facility] where pt noted to appear to be coughing and sob. Pt noted to have cough for past several days. No sore throat. Denies cp. +mild sob. No leg pain or swelling. No specific known ill contacts. No report of fevers, although temp in ED 104. Pt limited historian - level 5 caveat.     The history is provided by the patient, medical records and the EMS personnel. The history is limited by the condition of the patient.  Cough Associated symptoms: fever and shortness of breath   Associated symptoms: no chest pain, no headaches and no rash        Home Medications Prior to Admission medications   Medication Sig Start Date End Date Taking? Authorizing Provider  acetaminophen (TYLENOL) 500 MG tablet Take 1,000 mg by mouth 2 (two) times daily.   Yes [provider]  amLODipine (NORVASC) 2.5 MG tablet Take 2.5 mg by mouth daily.   Yes [provider]  aspirin 81 MG tablet Take 81 mg by mouth daily.   Yes [provider]  carboxymethylcellulose (REFRESH PLUS) 0.5 % SOLN Place 2 drops into both eyes 2 (two) times daily.   Yes [provider]  cetirizine (ZYRTEC) 10 MG tablet Take 10 mg by mouth daily.   Yes [provider]  Cholecalciferol (VITAMIN D-3) 25 MCG (1000 UT) CAPS Take 2,000 Units by mouth daily.   Yes [provider]  diphenhydramine-acetaminophen (TYLENOL PM) 25-500 MG TABS tablet Take 1 tablet by mouth at bedtime.   Yes [provider]  docusate sodium (COLACE) 100 MG capsule Take 300 mg by mouth daily.   Yes [provider]  flunisolide (NASALIDE) 25 MCG/ACT (0.025%) SOLN Place 1 spray into the nose daily.   Yes [provider]  fluticasone (FLONASE)  50 MCG/ACT nasal spray Place 1 spray into both nostrils in the morning.   Yes [provider]  guaiFENesin (MUCINEX CHILDRENS PO) Take 20 mLs by mouth 2 (two) times daily. For 7 days. Starting 04/14/22   Yes [provider]  ketotifen (ZADITOR) 0.025 % ophthalmic solution Place 1 drop into both eyes 3 (three) times daily.   Yes [provider]  levothyroxine (SYNTHROID, LEVOTHROID) 75 MCG tablet Take 75 mcg by mouth daily before breakfast.   Yes [provider]  loperamide (ANTI-DIARRHEAL) 2 MG tablet Take 2 mg by mouth See admin instructions. 2 mg after each loose stool. Max 6 mg in 24 hours   Yes [provider]  loratadine (CLARITIN) 10 MG tablet Take 10 mg by mouth daily. For 7 days. Starting 04/14/22   Yes [provider]  metoprolol succinate (TOPROL-XL) 25 MG 24 hr tablet Take 25 mg by mouth daily.   Yes [provider]  Multiple Vitamins-Minerals (PRESERVISION AREDS 2) CHEW Chew 2 tablets by mouth daily.   Yes [provider]  nortriptyline (PAMELOR) 25 MG capsule Take 25 mg by mouth at bedtime.   Yes [provider]  OVER THE COUNTER MEDICATION Apply 1 application  topically See admin instructions. Baza Protect 1%-12% cream. Apply to buttocks after each incontinent episode or urinating bed as needed.   Yes [provider]  pantoprazole (PROTONIX) 40 MG  tablet Take 1 tablet (40 mg total) by mouth 2 (two) times daily before a meal. 02/10/21 06/03/22 Yes Alekh, Kshitiz, MD  simvastatin (ZOCOR) 40 MG tablet Take 20 mg by mouth at bedtime.   Yes [provider]  sucralfate (CARAFATE) 1 g tablet Take 1 g by mouth 2 (two) times daily with a meal.   Yes [provider]  tamsulosin (FLOMAX) 0.4 MG CAPS capsule Take 0.4 mg by mouth at bedtime.   Yes [provider]  triamcinolone ointment (KENALOG) 0.1 % Apply 1 Application topically 2 (two) times daily. To affected area on left side below  left buttocks. For 14 days. Starting 04/06/22   Yes [provider]  vitamin C (ASCORBIC ACID) 500 MG tablet Take 500 mg by mouth daily.   Yes [provider]  Boswellia-Glucosamine-Vit D (OSTEO BI-FLEX ONE PER DAY PO) Take 1 tablet by mouth daily. 1500MG -400MG     [provider]      Allergies    Lisinopril    Review of Systems   Review of Systems  Constitutional:  Positive for fever.  Respiratory:  Positive for cough and shortness of breath.   Cardiovascular:  Negative for chest pain.  Gastrointestinal:  Negative for abdominal pain, diarrhea and vomiting.  Genitourinary:  Negative for dysuria.  Musculoskeletal:  Negative for neck pain and neck stiffness.  Skin:  Negative for rash.  Neurological:  Negative for headaches.    Physical Exam Updated Vital Signs BP 109/61   Pulse (!) 152   Temp (!) 104 F (40 C) (Rectal)   Resp (!) 29   SpO2 100%  Physical Exam Vitals and nursing note reviewed.  Constitutional:      Appearance: Normal appearance. He is well-developed.  HENT:     Head: Atraumatic.     Nose: Nose normal.     Mouth/Throat:     Mouth: Mucous membranes are moist.     Pharynx: Posterior oropharyngeal erythema present. No oropharyngeal exudate.  Eyes:     General: No scleral icterus.    Conjunctiva/sclera: Conjunctivae normal.     Pupils: Pupils are equal, round, and reactive to light.  Neck:     Trachea: No tracheal deviation.     Comments: No stiffness or rigidity.  Cardiovascular:     Rate and Rhythm: Normal rate and regular rhythm.     Pulses: Normal pulses.     Heart sounds: Normal heart sounds. No murmur heard.    No friction rub. No gallop.  Pulmonary:     Effort: Pulmonary effort is normal. No accessory muscle usage or respiratory distress.     Breath sounds: Rales present.     Comments: LLL rales/rhonchi.  Abdominal:     General: Bowel sounds are normal. There is no distension.     Palpations: Abdomen is soft.      Tenderness: There is no abdominal tenderness.  Genitourinary:    Comments: No cva tenderness. Musculoskeletal:        General: No swelling.     Cervical back: Normal range of motion and neck supple. No rigidity.     Right lower leg: No edema.     Left lower leg: No edema.  Skin:    General: Skin is warm and dry.     Findings: No rash.  Neurological:     Mental Status: He is alert.     Comments: Alert, speech clear. Motor/sens grossly intact bil.   Psychiatric:  Mood and Affect: Mood normal.     ED Results / Procedures / Treatments   Labs (all labs ordered are listed, but only abnormal results are displayed) Results for orders placed or performed during the hospital encounter of 04/19/22  SARS Coronavirus 2 by RT PCR (hospital order, performed in [facility] hospital lab) *cepheid single result test* Anterior Nasal Swab   Specimen: Anterior Nasal Swab  Result Value Ref Range   SARS Coronavirus 2 by RT PCR NEGATIVE NEGATIVE  MRSA Next Gen by PCR, Nasal   Specimen: Nasal Mucosa; Nasal Swab  Result Value Ref Range   MRSA by PCR Next Gen NOT DETECTED NOT DETECTED  Lactic acid, plasma  Result Value Ref Range   Lactic Acid, Venous 2.2 (HH) 0.5 - 1.9 mmol/L  CBC  Result Value Ref Range   WBC 7.3 4.0 - 10.5 K/uL   RBC 3.24 (L) 4.22 - 5.81 MIL/uL   Hemoglobin 11.0 (L) 13.0 - 17.0 g/dL   HCT 16.1 (L) 09.6 - 04.5 %   MCV 104.9 (H) 80.0 - 100.0 fL   MCH 34.0 26.0 - 34.0 pg   MCHC 32.4 30.0 - 36.0 g/dL   RDW 40.9 81.1 - 91.4 %   Platelets 208 150 - 400 K/uL   nRBC 0.0 0.0 - 0.2 %  Comprehensive metabolic panel  Result Value Ref Range   Sodium 139 135 - 145 mmol/L   Potassium 3.8 3.5 - 5.1 mmol/L   Chloride 105 98 - 111 mmol/L   CO2 22 22 - 32 mmol/L   Glucose, Bld 163 (H) 70 - 99 mg/dL   BUN 35 (H) 8 - 23 mg/dL   Creatinine, Ser 7.82 (H) 0.61 - 1.24 mg/dL   Calcium 8.7 (L) 8.9 - 10.3 mg/dL   Total Protein 6.7 6.5 - 8.1 g/dL   Albumin 3.1 (L) 3.5 - 5.0 g/dL   AST 31  15 - 41 U/L   ALT 19 0 - 44 U/L   Alkaline Phosphatase 61 38 - 126 U/L   Total Bilirubin 0.8 0.3 - 1.2 mg/dL   GFR, Estimated 38 (L) >60 mL/min   Anion gap 12 5 - 15  Urinalysis, Routine w reflex microscopic Urine, Clean Catch  Result Value Ref Range   Color, Urine YELLOW YELLOW   APPearance HAZY (A) CLEAR   Specific Gravity, Urine 1.016 1.005 - 1.030   pH 5.0 5.0 - 8.0   Glucose, UA NEGATIVE NEGATIVE mg/dL   Hgb urine dipstick NEGATIVE NEGATIVE   Bilirubin Urine NEGATIVE NEGATIVE   Ketones, ur NEGATIVE NEGATIVE mg/dL   Protein, ur 956 (A) NEGATIVE mg/dL   Nitrite NEGATIVE NEGATIVE   Leukocytes,Ua NEGATIVE NEGATIVE   RBC / HPF 0-5 0 - 5 RBC/hpf   WBC, UA 0-5 0 - 5 WBC/hpf   Bacteria, UA RARE (A) NONE SEEN   Squamous Epithelial / LPF 0-5 0 - 5   DG Chest 2 View  Result Date: 04/19/2022 CLINICAL DATA:  Cough. EXAM: CHEST - 2 VIEW COMPARISON:  January 30, 2021. FINDINGS: Stable cardiomediastinal silhouette. Right lung is clear. Left lower lobe airspace opacity is noted concerning for pneumonia. Bony thorax is unremarkable. IMPRESSION: Left lower lobe airspace opacity is noted concerning for pneumonia. Followup PA and lateral chest X-ray is recommended in 3-4 weeks following trial of antibiotic therapy to ensure resolution and exclude underlying malignancy. Electronically Signed   By: Lupita Raider M.D.   On: 04/19/2022 12:55     EKG EKG Interpretation  Date/Time:  Wednesday April 19 2022 12:50:54 EDT Ventricular Rate:  156 PR Interval:    QRS Duration: 103 QT Interval:  304 QTC Calculation: 490 R Axis:   18 Text Interpretation: Narrow QRS tachycardia Low voltage, precordial leads Repolarization abnormality, prob rate related Non-specific ST-t changes Confirmed by Cathren LaineSteinl, Reilley Valentine ([id number]) on 04/19/2022 1:18:01 PM  Radiology DG Chest 2 View  Result Date: 04/19/2022 CLINICAL DATA:  Cough. EXAM: CHEST - 2 VIEW COMPARISON:  January 30, 2021. FINDINGS: Stable cardiomediastinal silhouette.  Right lung is clear. Left lower lobe airspace opacity is noted concerning for pneumonia. Bony thorax is unremarkable. IMPRESSION: Left lower lobe airspace opacity is noted concerning for pneumonia. Followup PA and lateral chest X-ray is recommended in 3-4 weeks following trial of antibiotic therapy to ensure resolution and exclude underlying malignancy. Electronically Signed   By: Lupita RaiderJames  Green Jr M.D.   On: 04/19/2022 12:55    Procedures Procedures    Medications Ordered in ED Medications  diltiazem (CARDIZEM) 1 mg/mL load via infusion 10 mg (has no administration in time range)    And  diltiazem (CARDIZEM) 125 mg in dextrose 5% 125 mL (1 mg/mL) infusion (has no administration in time range)  acetaminophen (TYLENOL) tablet 1,000 mg (has no administration in time range)  ceFEPIme (MAXIPIME) 1 g in sodium chloride 0.9 % 100 mL IVPB (has no administration in time range)    ED Course/ Medical Decision Making/ A&P                           Medical Decision Making Problems Addressed: Acute respiratory failure with hypoxia [facility](HCC): acute illness or injury with systemic symptoms that poses a threat to life or bodily functions AKI (acute kidney injury) (HCC): acute illness or injury that poses a threat to life or bodily functions Community acquired pneumonia of left lower lobe of lung: acute illness or injury with systemic symptoms that poses a threat to life or bodily functions Elevated lactic acid level: acute illness or injury  Amount and/or Complexity of Data Reviewed Independent Historian: EMS    Details: hx External Data Reviewed: labs and notes. Labs: ordered. Decision-making details documented in ED Course. Radiology: ordered and independent interpretation performed. Decision-making details documented in ED Course. ECG/medicine tests: ordered and independent interpretation performed. Decision-making details documented in ED Course. Discussion of management or test interpretation with  external provider(s): Hospitalist and palliative care physicians   Risk OTC drugs. Prescription drug management. Decision regarding hospitalization.   Iv ns. Continuous pulse ox and cardiac monitoring. Labs ordered/sent. Imaging ordered.   Reviewed nursing notes and prior charts for additional history. External reports reviewed. Additional history from:  Cardiac monitor: sinus rhythm, rate 90.  Recheck, narrow complex tachy rate 150.  Labs reviewed/interpreted by me - lactate elev. Ns bolus. Cxs, iv abx, repeat lactate.   Xrays reviewed/interpreted by me - + pna.   Initially hr normal. On recheck, in 150 range, narrow complex. Pt denies chest pain. Cardizem bolus/gtt.   As HR high, bp soft, attempted vagal maneuvers (no carotid bruit, conversion to NSR, ecg repeated).   Rectal temp now 104, no report of fevers pta. Cultures and lactate added. Iv abx.   Palliative care consult placed - they will see in ED.   Hospitalists consulted for admission - they will admit.   CRITICAL CARE RE: pneumonia with hypoxia, sepsis, new onset afib w rvr. Performed by: Suzi RootsKevin E Bellamarie Pflug Total critical care time: 45 minutes Critical  care time was exclusive of separately billable procedures and treating other patients. Critical care was necessary to treat or prevent imminent or life-threatening deterioration. Critical care was time spent personally by me on the following activities: development of treatment plan with patient and/or surrogate as well as nursing, discussions with consultants, evaluation of patient's response to treatment, examination of patient, obtaining history from patient or surrogate, ordering and performing treatments and interventions, ordering and review of laboratory studies, ordering and review of radiographic studies, pulse oximetry and re-evaluation of patient's condition.            Final Clinical Impression(s) / ED Diagnoses Final diagnoses:  None    Rx / DC  Orders ED Discharge Orders     None         Cathren Laine, MD 04/19/22 [phone]

## 2022-04-19 NOTE — H&P (Addendum)
History and Physical    Patient: Ricky Sandoval DOB: 16-May-1922 DOA: 04/19/2022 DOS: the patient was seen and examined on 04/19/2022 PCP: Chilton Greathouse, MD  Patient coming from: [facility] via EMS  Chief Complaint:  Chief Complaint  Patient presents with   Cough   HPI: Ricky Sandoval is a 88 y.o. male with medical history significant of hypertension hyperlipidemia, diabetes mellitus type 2, and hypothyroidism who presented due to concern for cough and shortness of breath.  The patient is able to give some history.  Patient reports that he does not know why he is here, but admits that he has been coughing.  He is nonambulatory and normally gets around with use of a wheelchair.  He reports that previously he has had issues with swallowing for which he had to have a procedure to stretch his esophagus.  At this time and does not complain of feeling short of breath and has had no leg pain or swelling.  Review of records note that staff noticed the patient started coughing this morning and had mucus and food coming up for which there was concern for food getting stuck in his throat.  Patient complained of sore throat, but denies that at this time  In the ED patient was noted to have a temperature elevated up to 104 F.  O2 saturation was noted to be as low as 85% with improved on 2 L nasal cannula oxygen.  Patient was noted to go into a narrow complex tachycardia up to 152 bpm which reverted to a sinus rhythm with vagal maneuver.  Labs significant for hemoglobin 11 BUN 35, creatinine 1.64, and lactic acid 2.2-> 1.2.  Patient has been given 1 L bolus of IV fluids, Tylenol 975 mg rectally, vancomycin, and cefepime.   Review of Systems: As mentioned in the history of present illness. All other systems reviewed and are negative. Past Medical History:  Diagnosis Date   Diabetes type 2, controlled (HCC)    High cholesterol    Hypertension    Hypothyroid    Past  Surgical History:  Procedure Laterality Date   BALLOON DILATION N/A 01/30/2021   Procedure: BALLOON DILATION;  Surgeon: Hilarie Fredrickson, MD;  Location: [facility] ENDOSCOPY;  Service: Endoscopy;  Laterality: N/A;   BIOPSY  01/30/2021   Procedure: BIOPSY;  Surgeon: Hilarie Fredrickson, MD;  Location: [facility] ENDOSCOPY;  Service: Endoscopy;;   BIOPSY  02/01/2021   Procedure: BIOPSY;  Surgeon: Meryl Dare, MD;  Location: [facility] ENDOSCOPY;  Service: Endoscopy;;   BIOPSY  02/08/2021   Procedure: BIOPSY;  Surgeon: Tressia Danas, MD;  Location: [facility] ENDOSCOPY;  Service: Gastroenterology;;   ESOPHAGOGASTRODUODENOSCOPY N/A 02/01/2021   Procedure: ESOPHAGOGASTRODUODENOSCOPY (EGD);  Surgeon: Meryl Dare, MD;  Location: [facility] ENDOSCOPY;  Service: Endoscopy;  Laterality: N/A;   ESOPHAGOGASTRODUODENOSCOPY (EGD) WITH PROPOFOL N/A 01/30/2021   Procedure: ESOPHAGOGASTRODUODENOSCOPY (EGD) WITH PROPOFOL;  Surgeon: Hilarie Fredrickson, MD;  Location: [facility] ENDOSCOPY;  Service: Endoscopy;  Laterality: N/A;   ESOPHAGOGASTRODUODENOSCOPY (EGD) WITH PROPOFOL N/A 02/08/2021   Procedure: ESOPHAGOGASTRODUODENOSCOPY (EGD) WITH PROPOFOL;  Surgeon: Tressia Danas, MD;  Location: [facility] ENDOSCOPY;  Service: Gastroenterology;  Laterality: N/A;   FOREIGN BODY REMOVAL  01/30/2021   Procedure: FOREIGN BODY REMOVAL;  Surgeon: Hilarie Fredrickson, MD;  Location: [facility] ENDOSCOPY;  Service: Endoscopy;;   SAVORY DILATION N/A 02/01/2021   Procedure: Gaspar Bidding DILATION;  Surgeon: Meryl Dare, MD;  Location: [facility] ENDOSCOPY;  Service: Endoscopy;  Laterality: N/A;   Social  History:  reports that he has never smoked. He has never used smokeless tobacco. He reports that he does not currently use alcohol. He reports that he does not use drugs.  Allergies  Allergen Reactions   Zestril [Lisinopril] Other (See Comments)    Airway constriction    No family history on file.  Prior to Admission medications   Medication Sig Start Date End Date Taking? Authorizing Provider  acetaminophen  (TYLENOL) 500 MG tablet Take 1,000 mg by mouth 2 (two) times daily.   Yes [provider]  amLODipine (NORVASC) 2.5 MG tablet Take 2.5 mg by mouth daily.   Yes [provider]  aspirin 81 MG tablet Take 81 mg by mouth daily.   Yes [provider]  carboxymethylcellulose (REFRESH PLUS) 0.5 % SOLN Place 2 drops into both eyes 2 (two) times daily.   Yes [provider]  cetirizine (ZYRTEC) 10 MG tablet Take 10 mg by mouth daily.   Yes [provider]  Cholecalciferol (VITAMIN D-3) 25 MCG (1000 UT) CAPS Take 2,000 Units by mouth daily.   Yes [provider]  diphenhydramine-acetaminophen (TYLENOL PM) 25-500 MG TABS tablet Take 1 tablet by mouth at bedtime.   Yes [provider]  docusate sodium (COLACE) 100 MG capsule Take 300 mg by mouth daily.   Yes [provider]  flunisolide (NASALIDE) 25 MCG/ACT (0.025%) SOLN Place 1 spray into the nose daily.   Yes [provider]  fluticasone (FLONASE) 50 MCG/ACT nasal spray Place 1 spray into both nostrils in the morning.   Yes [provider]  guaiFENesin (MUCINEX CHILDRENS PO) Take 20 mLs by mouth 2 (two) times daily. For 7 days. Starting 04/14/22   Yes [provider]  ketotifen (ZADITOR) 0.025 % ophthalmic solution Place 1 drop into both eyes 3 (three) times daily.   Yes [provider]  levothyroxine (SYNTHROID, LEVOTHROID) 75 MCG tablet Take 75 mcg by mouth daily before breakfast.   Yes [provider]  loperamide (ANTI-DIARRHEAL) 2 MG tablet Take 2 mg by mouth See admin instructions. 2 mg after each loose stool. Max 6 mg in 24 hours   Yes [provider]  loratadine (CLARITIN) 10 MG tablet Take 10 mg by mouth daily. For 7 days. Starting 04/14/22   Yes [provider]  metoprolol succinate (TOPROL-XL) 25 MG 24 hr tablet Take 25 mg by mouth daily.   Yes [provider]  Multiple Vitamins-Minerals (PRESERVISION AREDS  2) CHEW Chew 2 tablets by mouth daily.   Yes [provider]  nortriptyline (PAMELOR) 25 MG capsule Take 25 mg by mouth at bedtime.   Yes [provider]  OVER THE COUNTER MEDICATION Apply 1 application  topically See admin instructions. Baza Protect 1%-12% cream. Apply to buttocks after each incontinent episode or urinating bed as needed.   Yes [provider]  pantoprazole (PROTONIX) 40 MG tablet Take 1 tablet (40 mg total) by mouth 2 (two) times daily before a meal. 02/10/21 06/03/22 Yes Glade Lloyd, MD  simvastatin (ZOCOR) 40 MG tablet Take 20 mg by mouth at bedtime.   Yes [provider]  sucralfate (CARAFATE) 1 g tablet Take 1 g by mouth 2 (two) times daily with a meal.   Yes [provider]  tamsulosin (FLOMAX) 0.4 MG CAPS capsule Take 0.4 mg by mouth at bedtime.   Yes [provider]  triamcinolone ointment (KENALOG) 0.1 % Apply 1 Application topically 2 (two) times daily. To affected area on  left side below left buttocks. For 14 days. Starting 04/06/22   Yes [provider]  vitamin C (ASCORBIC ACID) 500 MG tablet Take 500 mg by mouth daily.   Yes [provider]    Physical Exam: Vitals:   04/19/22 1545 04/19/22 1558 04/19/22 1615 04/19/22 1630  BP: 105/68  (!) 113/46 99/65  Pulse: 82 77 76 73  Resp: (!) 22 19 20 18   Temp:      TempSrc:      SpO2: 90% 92% 91% 91%   Exam  Constitutional: Elderly male who appears to be in no acute distress and able to follow commands.  Patient's voice is hoarse Eyes: PERRL, lids and conjunctivae normal ENMT: Mucous membranes are moist.  Posterior oropharyngeal irritation appreciated Neck: normal, supple, no JVD  respiratory: Rales noted on the left lung field.  O2 saturation currently maintained on 2 L of nasal cannula oxygen Cardiovascular: Regular rate and rhythm, no murmurs / rubs / gallops. No extremity edema. 2+ pedal pulses. No carotid bruits.  Abdomen: no tenderness,  no masses palpated. Bowel sounds positive.  Musculoskeletal: no clubbing / cyanosis. No joint deformity upper and lower extremities. Good ROM, no contractures. Normal muscle tone.  Skin: no rashes, lesions, ulcers. No induration Neurologic: CN 2-12 grossly intact.  Able to move all extremities. Psychiatric: Poor memory. Alert and oriented x person and place.   Data Reviewed:  EKG sinus rhythm at 84 bpm with low voltage  Assessment and Plan: Acute respiratory failure with hypoxia Sepsis secondary to pneumonia Patient noted to be febrile up to 104 F with tachycardia and tachypnea on admission.  O2 saturations noted to be as low as 85% on room air with improvement on 2 L of nasal cannula oxygen.  He had reported having intermittent cough and was noted to have left lobe pneumonia on chest x-ray.  Question if secondary to aspiration.  Blood cultures have been attained and patient was started on empiric antibiotics of vancomycin and cefepime. -Admit to a progressive bed -Continuous pulse oximetry with nasal cannula oxygen maintain O2 saturation greater than 92% -Check blood and sputum cultures -Check urine Legionella and urine strep -Check MRSA screen -Incentive spirometry and flutter valve -Continue vancomycin per pharmacy.  Rocephin and azithromycin substituted for cefepime -Tylenol as needed for fever -Speech therapy to evaluate for swallowing and possibility of stricture.  Patient may need barium esophagram  Acute kidney injury superimposed on chronic kidney disease stage IIIa Patient presents with creatinine 1.64 with BUN 35.  Baseline creatinine had previously been around 1.18 last year.  Urinalysis did not note significant signs of infection. This is greater than a 0.3 increased from baseline to suggest acute kidney injury.  Patient has been given 1 L of normal saline IV fluids. -Normal saline IV fluids at 75 mL/h -Recheck kidney function in a.m.  Narrow complex tachycardia Patient  was noted to be in a narrow complex tachycardia with heart rates into the 150s.  He had been started on diltiazem, but converted back into a sinus rhythm reportedly with vagal maneuver. -Discontinue diltiazem drip -Check high-sensitivity troponins -Check electrolytes and replace if needed -Check echocardiogram  Essential hypertension Blood pressure regimen includes amlodipine 2.5 mg daily and metoprolol 25 mg daily. -Continue current regimen in a.m. as tolerated  Normocytic anemia Hemoglobin 11 on admission.  Baseline hemoglobin previously 9-10. -Recheck CBC tomorrow morning  BPH -Continue Flomax  GERD history of esophageal stricture Patient with history of gastritis, duodenitis, and esophageal stricture. -Speech therapy  consulted for swallow evaluation.  May need to consult GI if signs of stricture appreciated -Continue Protonix  DVT prophylaxis: Lovenox Advance Care Planning:   Code Status: DNR   Consults: Palliative care  Severity of Illness: The appropriate patient status for this patient is INPATIENT. Inpatient status is judged to be reasonable and necessary in order to provide the required intensity of service to ensure the patient's safety. The patient's presenting symptoms, physical exam findings, and initial radiographic and laboratory data in the context of their chronic comorbidities is felt to place them at high risk for further clinical deterioration. Furthermore, it is not anticipated that the patient will be medically stable for discharge from the hospital within 2 midnights of admission.   * I certify that at the point of admission it is my clinical judgment that the patient will require inpatient hospital care spanning beyond 2 midnights from the point of admission due to high intensity of service, high risk for further deterioration and high frequency of surveillance required.*  Author: Norval Morton, MD 04/19/2022 4:56 PM  For on call review [email or web address].

## 2022-04-19 NOTE — ED Triage Notes (Signed)
Pt BIB [facility] from [facility] for cough. Cough started after breakfast this AM, staff noticed mucus and food coming up, concern for food getting stuck in throat. Pt endorses sore throat. Denies other complaints. Staff reports pt is at his baseline mental status. EMS VS- 134/60, HR 88, RR 18, 92%

## 2022-04-19 NOTE — ED Notes (Signed)
Pt's son, [name], updated

## 2022-04-20 ENCOUNTER — Encounter (HOSPITAL_COMMUNITY): Payer: Self-pay | Admitting: Internal Medicine

## 2022-04-20 ENCOUNTER — Other Ambulatory Visit (HOSPITAL_COMMUNITY): Payer: Medicare Other

## 2022-04-20 ENCOUNTER — Inpatient Hospital Stay (HOSPITAL_COMMUNITY): Payer: Medicare Other

## 2022-04-20 DIAGNOSIS — A413 Sepsis due to Hemophilus influenzae: Secondary | ICD-10-CM | POA: Diagnosis present

## 2022-04-20 DIAGNOSIS — J9601 Acute respiratory failure with hypoxia: Secondary | ICD-10-CM | POA: Diagnosis not present

## 2022-04-20 DIAGNOSIS — Z8719 Personal history of other diseases of the digestive system: Secondary | ICD-10-CM | POA: Diagnosis not present

## 2022-04-20 DIAGNOSIS — N179 Acute kidney failure, unspecified: Secondary | ICD-10-CM | POA: Diagnosis not present

## 2022-04-20 DIAGNOSIS — J189 Pneumonia, unspecified organism: Secondary | ICD-10-CM | POA: Diagnosis not present

## 2022-04-20 LAB — BLOOD CULTURE ID PANEL (REFLEXED) - BCID2

## 2022-04-20 LAB — BASIC METABOLIC PANEL
Anion gap: 8 (ref 5–15)
BUN: 31 mg/dL — ABNORMAL HIGH (ref 8–23)
CO2: 23 mmol/L (ref 22–32)
Calcium: 7.9 mg/dL — ABNORMAL LOW (ref 8.9–10.3)
Chloride: 110 mmol/L (ref 98–111)
Creatinine, Ser: 1.34 mg/dL — ABNORMAL HIGH (ref 0.61–1.24)
GFR, Estimated: 48 mL/min — ABNORMAL LOW (ref >60)
Glucose, Bld: 100 mg/dL — ABNORMAL HIGH (ref 70–99)
Potassium: 3.5 mmol/L (ref 3.5–5.1)
Sodium: 141 mmol/L (ref 135–145)

## 2022-04-20 LAB — CBC
HCT: 25.9 % — ABNORMAL LOW (ref 39.0–52.0)
Hemoglobin: 8.7 g/dL — ABNORMAL LOW (ref 13.0–17.0)
MCH: 34.7 pg — ABNORMAL HIGH (ref 26.0–34.0)
MCHC: 33.6 g/dL (ref 30.0–36.0)
MCV: 103.2 fL — ABNORMAL HIGH (ref 80.0–100.0)
Platelets: 168 10*3/uL (ref 150–400)
RBC: 2.51 MIL/uL — ABNORMAL LOW (ref 4.22–5.81)
RDW: 12.2 % (ref 11.5–15.5)
WBC: 8.3 10*3/uL (ref 4.0–10.5)
nRBC: 0 % (ref 0.0–0.2)

## 2022-04-20 LAB — STREP PNEUMONIAE URINARY ANTIGEN: Strep Pneumo Urinary Antigen: NEGATIVE

## 2022-04-20 LAB — TROPONIN I (HIGH SENSITIVITY)
Troponin I (High Sensitivity): 221 ng/L (ref <18)
Troponin I (High Sensitivity): 203 ng/L (ref <18)

## 2022-04-20 MED ORDER — ENSURE ENLIVE PO LIQD
237.0000 mL | Freq: Two times a day (BID) | ORAL | Status: DC
Start: 2022-04-20 — End: 2022-04-27
  Administered 2022-04-20 – 2022-04-26 (×8): 237 mL via ORAL

## 2022-04-20 MED ORDER — PNEUMOCOCCAL 20-VAL CONJ VACC 0.5 ML IM SUSY
0.5000 mL | PREFILLED_SYRINGE | INTRAMUSCULAR | Status: AC
  Administered 2022-04-25: 0.5 mL via INTRAMUSCULAR
  Filled 2022-04-20 (×2): qty 0.5

## 2022-04-20 MED ORDER — SODIUM CHLORIDE 0.9 % IV SOLN
2.0000 g | INTRAVENOUS | Status: DC
  Administered 2022-04-20 – 2022-04-23 (×4): 2 g via INTRAVENOUS
  Filled 2022-04-20 (×4): qty 20

## 2022-04-20 MED ORDER — ADULT MULTIVITAMIN W/MINERALS CH
1.0000 | ORAL_TABLET | Freq: Every day | ORAL | Status: DC
  Administered 2022-04-20 – 2022-04-27 (×8): 1 via ORAL
  Filled 2022-04-20 (×8): qty 1

## 2022-04-20 MED ORDER — ORAL CARE MOUTH RINSE: 15.0000 mL | OROMUCOSAL | Status: DC | PRN

## 2022-04-20 NOTE — Progress Notes (Signed)
Progress Note Patient: Ricky Sandoval QQV:[id number] DOB: [birth year] DOA: 04/19/2022  DOS: the patient was seen and examined on 04/20/2022  Brief hospital course: PMH of HTN, HLD, type-shortness of breath. Found to have acute respiratory failure with hypoxia and sepsis secondary to left lower lobe pneumonia secondary to H. influenzae infection and bacteremia along with AKI on CKD 3a. Currently plan is to continue IV antibiotics. Assessment and Plan: Acute respiratory failure with hypoxia and sepsis secondary to pneumonia due to H. influenzae bacteremia, present on admission. Meeting SIRS criteria, patient noted to be febrile up to 104 F with tachycardia and tachypnea on admission.  O2 saturations noted to be as low as 85% on room air with improvement on 2 L of nasal cannula oxygen.  He had reported having intermittent cough and was noted to have left lobe pneumonia on chest x-ray.  Question if secondary to aspiration.  Blood cultures have been attained and patient was started on empiric antibiotics of vancomycin and cefepime. Blood culture came back positive for H. influenzae.  Currently transitioning to IV ceftriaxone only.  MRSA PCR negative. Continue incentive spirometry and flutter valve Speech therapy recommended dysphagia 1 diet.  We will get x-ray esophagus for further work-up for dysphagia.   Acute kidney injury superimposed on chronic kidney disease stage IIIa Patient presents with creatinine 1.64 with BUN 35.  Baseline creatinine had previously been around 1.18 last year. Urinalysis did not note significant signs of infection. This is greater than a 0.3 increased from baseline to suggest acute kidney injury.   Avoid tolerance of medication. Continue with fluids.  Narrow complex tachycardia Patient was noted to be in a narrow complex tachycardia with heart rates into the 150s.  He had been started on diltiazem, but converted back into a sinus rhythm reportedly with vagal  maneuver. -Check echocardiogram   Essential hypertension We will hold amlodipine. Continue metoprolol.   Macrocytic anemia Hemoglobin 11 on admission.  Baseline hemoglobin previously 9-10.  Dropped to 8.7 most likely dilutional in nature. MCV elevated we will check further work-up. -Recheck CBC tomorrow morning   BPH -Continue Flomax   GERD history of esophageal stricture Patient with history of gastritis, duodenitis, and esophageal stricture. -Speech therapy consulted for swallow evaluation.  May need to consult GI if signs of stricture appreciated on the x-ray esophagus. -Continue Protonix  Subjective: Feeling better.  Still has some cough.  No shortness of breath no nausea no vomiting.  Fatigue improving.  No chest pain.  No abdominal pain.  No diarrhea.  Physical Exam: Vitals:   04/20/22 0343 04/20/22 0820 04/20/22 1100 04/20/22 1702  BP:  (!) 120/49 103/64 (!) 121/54  Pulse:  80 96 79  Resp:  19 20 19   Temp:  98.6 F (37 C) 97.7 F (36.5 C) 98 F (36.7 C)  TempSrc:  Oral Oral Oral  SpO2:  95% 97% 93%  Weight: 76.2 kg     Height: 5\' 5"  (1.651 m)      General: Appear in mild distress; no visible Abnormal Neck Mass Or lumps, Conjunctiva normal Cardiovascular: S1 and S2 Present, aortic systolic  Murmur, Respiratory: increased respiratory effort, Bilateral Air entry present and faint Crackles, Occasional wheezes Abdomen: Bowel Sound present, Non tender  Extremities: trace Pedal edema Neurology: alert and oriented to place and person  Gait not checked due to patient safety concerns   Data Reviewed: I have Reviewed nursing notes, Vitals, and Lab results since pt's last encounter. Pertinent lab results CBC and BMP I  have ordered test including CBC and BMP I have ordered imaging studies x-ray esophagus.   Family Communication: No one at bedside.  Disposition: Status is: Inpatient Remains inpatient appropriate because: Need for IV antibiotics.  Author: Lynden Oxford, MD 04/20/2022 7:16 PM  Please look on [email or web address] to find out who is on call.

## 2022-04-20 NOTE — Progress Notes (Signed)
Daily Progress Note   Patient Name: Ricky Sandoval       Date: 04/20/2022 DOB: 08/20/1922  Age: 88 y.o. MRN#: [id number] Attending Physician: Rolly Salter, MD Primary Care Physician: Chilton Greathouse, MD Admit Date: 04/19/2022  Reason for Consultation/Follow-up: Establishing goals of care  Subjective: Medical records reviewed including progress notes, labs, imaging. Patient assessed at the bedside. Discussed with RN.  No family present during my visit.  I called patient's son/HCPOA [name] for ongoing palliative support and goals of care discussion.  He has received updates today and is glad to hear patient is slowly improving.  He confirms that as discussed with TOC, he hopes for patient to be able to return to ALF once stable.    Outpatient palliative care was explained and offered.  Also discussed with his daughter-in-law, who used to be a Physicist, medical.  They are both very agreeable and note that this was arranged at one time in the past.  Questions and concerns addressed. PMT will continue to support holistically.   Length of Stay: 1   Physical Exam Vitals and nursing note reviewed.  Constitutional:      General: He is sleeping. He is not in acute distress.    Appearance: He is ill-appearing.     Interventions: Nasal cannula in place.  Cardiovascular:     Rate and Rhythm: Normal rate.  Pulmonary:     Effort: Pulmonary effort is normal.  Skin:    General: Skin is warm and dry.            Vital Signs: BP 103/64 (BP Location: Left Arm)   Pulse 96   Temp 97.7 F (36.5 C) (Oral)   Resp 20   Ht 5\' 5"  (1.651 m)   Wt 76.2 kg   SpO2 97%   BMI 27.96 kg/m  SpO2: SpO2: 97 % O2 Device: O2 Device: Nasal Cannula O2 Flow Rate: O2 Flow Rate (L/min): 2.5 L/min       Palliative Assessment/Data: 40%   Palliative Care Assessment & Plan   Patient Profile: 88 y.o. male  with past medical history of hypertension hyperlipidemia, diabetes mellitus type 2, CKD 3a, and hypothyroidism presented to ED on 04/19/22 from [facility] with staff concerns of cough and mucus. Patient was admitted on 04/19/2022 with sepsis secondary to  pneumonia, AKI on CKD 3a, narrow complex tachycardia.   Assessment: Goals of care conversation Sepsis secondary to pneumonia Acute respiratory failure with hypoxia AKI on CKD 3A History of esophageal stricture  Recommendations/Plan: Continue DNR Continue current care Family requesting orders for daily ambulation(assist to chair) Goals for return to ALF with outpatient palliative care - patient was seen by [facility] outpatient palliative in June 2022, will reach out to ensure he continues to have ongoing support PMT will continue to follow and support as needed  Prognosis:  Unable to determine  Discharge Planning: ALF with outpatient palliative f/u  Care plan was discussed with RN, patient's son/HCPOA [name], daughter-in-law    Total time: I spent 35 minutes in the care of the patient today in the above activities and documenting the encounter.         Aeryn Medici Jeni Salles, PA-C  Palliative Medicine Team Team phone # [phone]  Thank you for allowing the Palliative Medicine Team to assist in the care of this patient. Please utilize secure chat with additional questions, if there is no response within 30 minutes please call the above phone number.  Palliative Medicine Team providers are available by phone from 7am to 7pm daily and can be reached through the team cell phone.  Should this patient require assistance outside of these hours, please call the patient's attending physician.

## 2022-04-20 NOTE — Evaluation (Addendum)
Clinical/Bedside Swallow Evaluation Patient Details  Name: Ricky Sandoval MRN: [id number] Date of Birth: September 30, 1921  Today's Date: 04/20/2022 Time: SLP Start Time (ACUTE ONLY): 0845 SLP Stop Time (ACUTE ONLY): 0908 SLP Time Calculation (min) (ACUTE ONLY): 23 min  Past Medical History:  Past Medical History:  Diagnosis Date   Diabetes type 2, controlled (HCC)    High cholesterol    Hypertension    Hypothyroid    Past Surgical History:  Past Surgical History:  Procedure Laterality Date   BALLOON DILATION N/A 01/30/2021   Procedure: BALLOON DILATION;  Surgeon: Hilarie Fredrickson, MD;  Location: [facility] ENDOSCOPY;  Service: Endoscopy;  Laterality: N/A;   BIOPSY  01/30/2021   Procedure: BIOPSY;  Surgeon: Hilarie Fredrickson, MD;  Location: [facility] ENDOSCOPY;  Service: Endoscopy;;   BIOPSY  02/01/2021   Procedure: BIOPSY;  Surgeon: Meryl Dare, MD;  Location: [facility] ENDOSCOPY;  Service: Endoscopy;;   BIOPSY  02/08/2021   Procedure: BIOPSY;  Surgeon: Tressia Danas, MD;  Location: [facility] ENDOSCOPY;  Service: Gastroenterology;;   ESOPHAGOGASTRODUODENOSCOPY N/A 02/01/2021   Procedure: ESOPHAGOGASTRODUODENOSCOPY (EGD);  Surgeon: Meryl Dare, MD;  Location: [facility] ENDOSCOPY;  Service: Endoscopy;  Laterality: N/A;   ESOPHAGOGASTRODUODENOSCOPY (EGD) WITH PROPOFOL N/A 01/30/2021   Procedure: ESOPHAGOGASTRODUODENOSCOPY (EGD) WITH PROPOFOL;  Surgeon: Hilarie Fredrickson, MD;  Location: [facility] ENDOSCOPY;  Service: Endoscopy;  Laterality: N/A;   ESOPHAGOGASTRODUODENOSCOPY (EGD) WITH PROPOFOL N/A 02/08/2021   Procedure: ESOPHAGOGASTRODUODENOSCOPY (EGD) WITH PROPOFOL;  Surgeon: Tressia Danas, MD;  Location: [facility] ENDOSCOPY;  Service: Gastroenterology;  Laterality: N/A;   FOREIGN BODY REMOVAL  01/30/2021   Procedure: FOREIGN BODY REMOVAL;  Surgeon: Hilarie Fredrickson, MD;  Location: [facility] ENDOSCOPY;  Service: Endoscopy;;   SAVORY DILATION N/A 02/01/2021   Procedure: Gaspar Bidding DILATION;  Surgeon: Meryl Dare, MD;  Location: [facility] ENDOSCOPY;  Service:  Endoscopy;  Laterality: N/A;   HPI:  Ricky Sandoval is a 88 y.o. male with medical history significant of hypertension hyperlipidemia, diabetes mellitus type 2, esophageal dysphagia, esophageal ulcer, presbyesophagus, with esophageal dilation, and hypothyroidism who presented due to concern for cough and shortness of breath. CXR remarkable for "left lower lobe airspace opacity is noted concerning for pneumonia."    Assessment / Plan / Recommendation  Clinical Impression  Pt was seen for a bedside swallow evaluation and he presents with suspected mild pharyngoesophageal dysphagia.  Pt has a known hx of esophageal stricture, and he stated that his most recent dilation was approximately one year ago.  Pt was noted to have reduced vocal intensity and mildly hoarse/breathy vocal quality.  He reported that this was baseline.  Oral mech was WNL.  Pt consumed trials of thin liquid, nectar-thick liquid, puree, and regular solids.  Mastication of solids was timely with no oral residue noted.  Delayed cough was noted following solid trials (>30 seconds after trial), and delayed throat clearing was noted following thin and nectar-thick liquids.  Difficult to clinically discern pharyngeal vs esophageal etiology.  Spoke with pt regarding different diet options and he was most comfortable with initiation of Dysphagia 3 (soft) solids and thin liquids with strict adherence to aspiration and GERD precautions.  Recommend medications be administered whole in puree (cut/crush large pills).  SLP will f/u to monitor diet tolerance and determine if instrumental swallow study is warranted.  Pt may benefit from a GI consult and/or an esophagram.   SLP Visit Diagnosis: Dysphagia, unspecified (R13.10)    Aspiration Risk  Mild aspiration risk    Diet Recommendation Dysphagia  3 (Mech soft);Thin liquid   Liquid Administration via: Cup;Straw Medication Administration: Whole meds with puree (cut/crush large pills) Supervision:  Patient able to self feed;Intermittent supervision to cue for compensatory strategies Compensations: Minimize environmental distractions;Small sips/bites;Slow rate Postural Changes: Seated upright at 90 degrees;Remain upright for at least 30 minutes after po intake    Other  Recommendations Recommended Consults: Consider GI evaluation;Consider esophageal assessment Oral Care Recommendations: Oral care BID    Recommendations for follow up therapy are one component of a multi-disciplinary discharge planning process, led by the attending physician.  Recommendations may be updated based on patient status, additional functional criteria and insurance authorization.  Follow up Recommendations Long-term institutional care without follow-up therapy      Assistance Recommended at Discharge Intermittent Supervision/Assistance  Functional Status Assessment Patient has had a recent decline in their functional status and demonstrates the ability to make significant improvements in function in a reasonable and predictable amount of time.  Frequency and Duration min 2x/week  2 weeks       Prognosis Prognosis for Safe Diet Advancement: Good      Swallow Study   General Date of Onset: 04/20/22 HPI: Ricky Sandoval is a 88 y.o. male with medical history significant of hypertension hyperlipidemia, diabetes mellitus type 2, and hypothyroidism who presented due to concern for cough and shortness of breath. CXR remarkable for "left lower lobe airspace opacity is noted concerning for pneumonia." Type of Study: Bedside Swallow Evaluation Previous Swallow Assessment: N/A Diet Prior to this Study: NPO Temperature Spikes Noted: Yes Respiratory Status: Nasal cannula History of Recent Intubation: No Behavior/Cognition: Alert;Cooperative;Pleasant mood Oral Cavity Assessment: Dry Oral Care Completed by SLP: No Oral Cavity - Dentition: Adequate natural dentition Vision: Functional for self-feeding Self-Feeding  Abilities: Able to feed self;Needs set up Patient Positioning: Upright in bed Baseline Vocal Quality: Low vocal intensity;Breathy;Hoarse Volitional Cough: Strong Volitional Swallow: Able to elicit    Oral/Motor/Sensory Function Overall Oral Motor/Sensory Function: Within functional limits   Ice Chips Ice chips: Within functional limits Presentation: Spoon   Thin Liquid Thin Liquid: Impaired Presentation: Straw Pharyngeal  Phase Impairments: Throat Clearing - Delayed;Throat Clearing - Immediate    Nectar Thick Nectar Thick Liquid: Impaired Presentation: Straw Pharyngeal Phase Impairments: Throat Clearing - Delayed   Honey Thick Honey Thick Liquid: Not tested   Puree Puree: Within functional limits Presentation: Spoon   Solid     Solid: Impaired Pharyngeal Phase Impairments: Cough - Delayed     Eino Farber, M.S., CCC-SLP Acute Rehabilitation Services Office: [phone]  Shanon Rosser Alonnah Lampkins 04/20/2022,9:33 AM

## 2022-04-20 NOTE — Hospital Course (Addendum)
PMH of HTN, HLD, type-shortness of breath. Found to have acute respiratory failure with hypoxia and sepsis secondary to left lower lobe pneumonia secondary to H. influenzae infection and bacteremia along with AKI on CKD 3a. Currently plan is to continue IV antibiotics.  ID consulted.  Echocardiogram unremarkable for any vegetation or acute abnormality.  Repeat cultures currently pending.

## 2022-04-20 NOTE — Progress Notes (Addendum)
Initial Nutrition Assessment  DOCUMENTATION CODES:   Not applicable  INTERVENTION:   Ensure Enlive po BID, each supplement provides 350 kcal and 20 grams of protein.  MVI with minerals daily.  NUTRITION DIAGNOSIS:   Inadequate oral intake related to acute illness as evidenced by meal completion < 50%.  GOAL:   Patient will meet greater than or equal to 90% of their needs  MONITOR:   PO intake, Supplement acceptance  REASON FOR ASSESSMENT:   Malnutrition Screening Tool    ASSESSMENT:   88 yo male admitted with PNA. PMH includes HTN, HLD, DM-2, hypothyroidism.  Patient reports that he has a good appetite and eats well. Weight has been stable. He is not picky, eats most everything. Diet was advanced to dysphagia 3 with thin liquids this morning after SLP evaluation. One meal intake documented today 50% at 11am. Lunch tray at bedside ~25% completed. Patient states that he likes ensure supplements, all flavors. He uses a wheelchair to get around, is unable to walk anymore without falling down.   Labs reviewed. Medications reviewed and include Colace, Carafate, Flomax. IVF: NS at 75 ml/h  Weight history reviewed. No significant weight changes noted recently.  NUTRITION - FOCUSED PHYSICAL EXAM:  Flowsheet Row Most Recent Value  Orbital Region Mild depletion  Upper Arm Region Mild depletion  Thoracic and Lumbar Region Mild depletion  Buccal Region No depletion  Temple Region No depletion  Clavicle Bone Region Mild depletion  Clavicle and Acromion Bone Region Mild depletion  Scapular Bone Region Mild depletion  Dorsal Hand Mild depletion  Patellar Region Mild depletion  Anterior Thigh Region Mild depletion  Posterior Calf Region Mild depletion  Edema (RD Assessment) None  Hair Reviewed  Eyes Reviewed  Mouth Reviewed  Skin Reviewed  Nails Reviewed       Diet Order:   Diet Order             DIET DYS 3 Room service appropriate? Yes with Assist; Fluid  consistency: Thin  Diet effective now                   EDUCATION NEEDS:   No education needs have been identified at this time  Skin:  Skin Assessment: Reviewed RN Assessment  Last BM:  unknown  Height:   Ht Readings from Last 1 Encounters:  04/20/22 5\' 5"  (1.651 m)    Weight:   Wt Readings from Last 1 Encounters:  04/20/22 76.2 kg     BMI:  Body mass index is 27.96 kg/m.  Estimated Nutritional Needs:   Kcal:  1700-1900  Protein:  85-95 gm  Fluid:  1.7-1.9 L   04/22/22 RD, LDN, CNSC Please refer to Amion for contact information.

## 2022-04-20 NOTE — Progress Notes (Signed)
PHARMACY - PHYSICIAN COMMUNICATION CRITICAL VALUE ALERT - BLOOD CULTURE IDENTIFICATION (BCID)  Ricky Sandoval is an 88 y.o. male who presented to [facility] on 04/19/2022 with a chief complaint of CAP  Assessment:  BCID came back with H. Flu bacteremia. D/w Dr Allena Katz. Will streamline to just ceftriaxone. [employer] for 7d  Name of physician (or Provider) Contacted: Dr Allena Katz  Current antibiotics: Ceftriaxone/zithromycin  Changes to prescribed antibiotics recommended:  Continue ceftriaxone. Stop azith  Results for orders placed or performed during the hospital encounter of 04/19/22  Blood Culture ID Panel (Reflexed) (Collected: 04/19/2022  1:25 PM)  Result Value Ref Range   Enterococcus faecalis NOT DETECTED NOT DETECTED   Enterococcus Faecium NOT DETECTED NOT DETECTED   Listeria monocytogenes NOT DETECTED NOT DETECTED   Staphylococcus species NOT DETECTED NOT DETECTED   Staphylococcus aureus (BCID) NOT DETECTED NOT DETECTED   Staphylococcus epidermidis NOT DETECTED NOT DETECTED   Staphylococcus lugdunensis NOT DETECTED NOT DETECTED   Streptococcus species NOT DETECTED NOT DETECTED   Streptococcus agalactiae NOT DETECTED NOT DETECTED   Streptococcus pneumoniae NOT DETECTED NOT DETECTED   Streptococcus pyogenes NOT DETECTED NOT DETECTED   A.calcoaceticus-baumannii NOT DETECTED NOT DETECTED   Bacteroides fragilis NOT DETECTED NOT DETECTED   Enterobacterales NOT DETECTED NOT DETECTED   Enterobacter cloacae complex NOT DETECTED NOT DETECTED   Escherichia coli NOT DETECTED NOT DETECTED   Klebsiella aerogenes NOT DETECTED NOT DETECTED   Klebsiella oxytoca NOT DETECTED NOT DETECTED   Klebsiella pneumoniae NOT DETECTED NOT DETECTED   Proteus species NOT DETECTED NOT DETECTED   Salmonella species NOT DETECTED NOT DETECTED   Serratia marcescens NOT DETECTED NOT DETECTED   Haemophilus influenzae DETECTED (A) NOT DETECTED   Neisseria meningitidis NOT DETECTED NOT DETECTED   Pseudomonas  aeruginosa NOT DETECTED NOT DETECTED   Stenotrophomonas maltophilia NOT DETECTED NOT DETECTED   Candida albicans NOT DETECTED NOT DETECTED   Candida auris NOT DETECTED NOT DETECTED   Candida glabrata NOT DETECTED NOT DETECTED   Candida krusei NOT DETECTED NOT DETECTED   Candida parapsilosis NOT DETECTED NOT DETECTED   Candida tropicalis NOT DETECTED NOT DETECTED   Cryptococcus neoformans/gattii NOT DETECTED NOT DETECTED    Ulyses Southward, PharmD, BCIDP, AAHIVP, CPP Infectious Disease Pharmacist 04/20/2022 5:25 PM

## 2022-04-20 NOTE — TOC Initial Note (Addendum)
Transition of Care [facility]) - Initial/Assessment Note    Patient Details  Name: Ricky Sandoval MRN: [id number] Date of Birth: 13-Nov-1921  Transition of Care [facility]) CM/SW Contact:    Delilah Shan, LCSWA Phone Number: 04/20/2022, 12:30 PM  Clinical Narrative:                  Due to patients current orientation CSW spoke with patients son [name] regarding dc plans for patient. Patients son informed CSW patient comes from [facility]. [name] confirmed plan is for patient to return to ALF when medically ready for dc. [name] confirmed plan for [facility] transport when patient ready to dc back over to ALF.CSW spoke with [facility] confirmed to send DC summary, FL2 to [phone] Att: Victorino Dike.CSW will continue to follow and assist with patients dc planning needs.  Expected Discharge Plan: Assisted Living ([facility]) Barriers to Discharge: Continued Medical Work up   Patient Goals and CMS Choice   CMS Medicare.gov Compare Post Acute Care list provided to:: Patient Represenative (must comment) (Patients son [name]) Choice offered to / list presented to : Adult Children (Patients son [name])  Expected Discharge Plan and Services Expected Discharge Plan: Assisted Living ([facility]) In-house Referral: Clinical Social Work                                            Prior Living Arrangements/Services   Lives with:: Facility Resident (From ALF) Patient language and need for interpreter reviewed:: Yes Do you feel safe going back to the place where you live?: Yes      Need for Family Participation in Patient Care: Yes (Comment) Care giver support system in place?: Yes (comment)   Criminal Activity/Legal Involvement Pertinent to Current Situation/Hospitalization: No - Comment as needed  Activities of Daily Living Home Assistive Devices/Equipment: Eyeglasses, Wheelchair, Environmental consultant (specify type) ADL Screening (condition at time of admission) Patient's  cognitive ability adequate to safely complete daily activities?: Yes Is the patient deaf or have difficulty hearing?: Yes Does the patient have difficulty seeing, even when wearing glasses/contacts?: Yes Does the patient have difficulty concentrating, remembering, or making decisions?: Yes Patient able to express need for assistance with ADLs?: Yes Does the patient have difficulty dressing or bathing?: Yes Independently performs ADLs?: No Communication: Independent Dressing (OT): Needs assistance Is this a change from baseline?: Change from baseline, expected to last >3 days Grooming: Needs assistance Is this a change from baseline?: Change from baseline, expected to last >3 days Feeding: Needs assistance Is this a change from baseline?: Change from baseline, expected to last >3 days Bathing: Dependent Is this a change from baseline?: Change from baseline, expected to last >3 days Toileting: Dependent Is this a change from baseline?: Change from baseline, expected to last >3days In/Out Bed: Dependent Is this a change from baseline?: Change from baseline, expected to last >3 days Walks in Home: Dependent Is this a change from baseline?: Change from baseline, expected to last >3 days Does the patient have difficulty walking or climbing stairs?: Yes Weakness of Legs: Both Weakness of Arms/Hands: Both  Permission Sought/Granted Permission sought to share information with : Case Manager, Family Supports, Oceanographer granted to share information with : No  Share Information with NAME: Due to patients current Orientation CSW spoke with patients son [name]  Permission granted to share info w AGENCY: Due to  patients current Orientation /ALF  Permission granted to share info w Relationship: Due to patients current Orientation /son  Permission granted to share info w Contact Information: Due to patients current Orientation [phone]  Emotional  Assessment       Orientation: : Oriented to Self, Oriented to Place Alcohol / Substance Use: Not Applicable Psych Involvement: No (comment)  Admission diagnosis:  Acute respiratory failure with hypoxia (HCC) [J96.01] AKI (acute kidney injury) (HCC) [N17.9] Elevated lactic acid level [R79.89] Sepsis (HCC) [A41.9] Community acquired pneumonia of left lower lobe of lung [J18.9] Patient Active Problem List   Diagnosis Date Noted   Sepsis due to pneumonia (HCC) 04/19/2022   Narrow complex tachycardia (HCC) 04/19/2022   Acute respiratory failure with hypoxia (HCC) 04/19/2022   Acute kidney injury superimposed on chronic kidney disease (HCC) 04/19/2022   Essential hypertension 04/19/2022   Normocytic anemia 04/19/2022   History of esophageal stricture 04/19/2022   Ulcer of esophagus without bleeding    Gastritis and gastroduodenitis    Dysphagia    Duodenitis with bleeding    Duodenal erosion    Esophageal stricture    PCP:  Chilton Greathouse, MD Pharmacy:  No Pharmacies Listed    Social Determinants of Health (SDOH) Interventions    Readmission Risk Interventions     No data to display

## 2022-04-21 ENCOUNTER — Telehealth: Payer: Self-pay | Admitting: Student

## 2022-04-21 ENCOUNTER — Inpatient Hospital Stay (HOSPITAL_COMMUNITY): Payer: Medicare Other

## 2022-04-21 DIAGNOSIS — A419 Sepsis, unspecified organism: Secondary | ICD-10-CM | POA: Diagnosis not present

## 2022-04-21 DIAGNOSIS — Z8719 Personal history of other diseases of the digestive system: Secondary | ICD-10-CM | POA: Diagnosis not present

## 2022-04-21 DIAGNOSIS — R9431 Abnormal electrocardiogram [ECG] [EKG]: Secondary | ICD-10-CM

## 2022-04-21 DIAGNOSIS — J189 Pneumonia, unspecified organism: Secondary | ICD-10-CM | POA: Diagnosis not present

## 2022-04-21 DIAGNOSIS — N179 Acute kidney failure, unspecified: Secondary | ICD-10-CM | POA: Diagnosis not present

## 2022-04-21 DIAGNOSIS — J9601 Acute respiratory failure with hypoxia: Secondary | ICD-10-CM | POA: Diagnosis not present

## 2022-04-21 LAB — CBC WITH DIFFERENTIAL/PLATELET
Abs Immature Granulocytes: 0.28 10*3/uL — ABNORMAL HIGH (ref 0.00–0.07)
Basophils Absolute: 0 10*3/uL (ref 0.0–0.1)
Basophils Relative: 1 %
Eosinophils Absolute: 0.3 10*3/uL (ref 0.0–0.5)
Eosinophils Relative: 4 %
HCT: 27.2 % — ABNORMAL LOW (ref 39.0–52.0)
Hemoglobin: 9 g/dL — ABNORMAL LOW (ref 13.0–17.0)
Immature Granulocytes: 4 %
Lymphocytes Relative: 15 %
Lymphs Abs: 1.2 10*3/uL (ref 0.7–4.0)
MCH: 34.4 pg — ABNORMAL HIGH (ref 26.0–34.0)
MCHC: 33.1 g/dL (ref 30.0–36.0)
MCV: 103.8 fL — ABNORMAL HIGH (ref 80.0–100.0)
Monocytes Absolute: 0.7 10*3/uL (ref 0.1–1.0)
Monocytes Relative: 9 %
Neutro Abs: 5.4 10*3/uL (ref 1.7–7.7)
Neutrophils Relative %: 67 %
Platelets: 194 10*3/uL (ref 150–400)
RBC: 2.62 MIL/uL — ABNORMAL LOW (ref 4.22–5.81)
RDW: 12.4 % (ref 11.5–15.5)
WBC: 7.9 10*3/uL (ref 4.0–10.5)
nRBC: 0 % (ref 0.0–0.2)

## 2022-04-21 LAB — ECHOCARDIOGRAM COMPLETE
AR max vel: 2.02 cm2
AV Area VTI: 2.28 cm2
AV Area mean vel: 2.12 cm2
AV Mean grad: 6 mmHg
AV Peak grad: 9.4 mmHg
Ao pk vel: 1.53 m/s
Area-P 1/2: 3.34 cm2
Height: 65 in
MV M vel: 3.31 m/s
MV Peak grad: 43.8 mmHg
S' Lateral: 2.7 cm
Weight: 2472.68 oz

## 2022-04-21 LAB — COMPREHENSIVE METABOLIC PANEL
ALT: 27 U/L (ref 0–44)
AST: 32 U/L (ref 15–41)
Albumin: 2.3 g/dL — ABNORMAL LOW (ref 3.5–5.0)
Alkaline Phosphatase: 57 U/L (ref 38–126)
Anion gap: 8 (ref 5–15)
BUN: 22 mg/dL (ref 8–23)
CO2: 21 mmol/L — ABNORMAL LOW (ref 22–32)
Calcium: 7.8 mg/dL — ABNORMAL LOW (ref 8.9–10.3)
Chloride: 110 mmol/L (ref 98–111)
Creatinine, Ser: 1.05 mg/dL (ref 0.61–1.24)
GFR, Estimated: >60 mL/min (ref >60)
Glucose, Bld: 101 mg/dL — ABNORMAL HIGH (ref 70–99)
Potassium: 3.4 mmol/L — ABNORMAL LOW (ref 3.5–5.1)
Sodium: 139 mmol/L (ref 135–145)
Total Bilirubin: 0.3 mg/dL (ref 0.3–1.2)
Total Protein: 5.1 g/dL — ABNORMAL LOW (ref 6.5–8.1)

## 2022-04-21 LAB — MAGNESIUM: Magnesium: 1.5 mg/dL — ABNORMAL LOW (ref 1.7–2.4)

## 2022-04-21 MED ORDER — MAGNESIUM SULFATE 2 GM/50ML IV SOLN
2.0000 g | Freq: Once | INTRAVENOUS | Status: AC
  Administered 2022-04-21: 2 g via INTRAVENOUS
  Filled 2022-04-21: qty 50

## 2022-04-21 MED ORDER — POTASSIUM CHLORIDE CRYS ER 20 MEQ PO TBCR
40.0000 meq | EXTENDED_RELEASE_TABLET | Freq: Once | ORAL | Status: AC
  Administered 2022-04-21: 40 meq via ORAL
  Filled 2022-04-21: qty 2

## 2022-04-21 NOTE — Progress Notes (Addendum)
[facility] [phone] [facility]) Hospital Liaison note: Civil engineer, contracting [facility]) Hospital Liaison note:  Notified by Dot Lanes of request for [facility] services. Josseline will put in an order for patient to be followed when discharged back to facility. Will continue to follow for disposition.  Please call with any outpatient palliative questions or concerns.  Thank you for the opportunity to participate in this patient's care.  Thank you, Abran Cantor, LPN [facility] Liaison [phone]

## 2022-04-21 NOTE — Progress Notes (Signed)
Progress Note Patient: Ricky Sandoval CVE:[id number] DOB: February 19, 1922 DOA: 04/19/2022  DOS: the patient was seen and examined on 04/21/2022  Brief hospital course: PMH of HTN, HLD, type-shortness of breath. Found to have acute respiratory failure with hypoxia and sepsis secondary to left lower lobe pneumonia secondary to H. influenzae infection and bacteremia along with AKI on CKD 3a. Currently plan is to continue IV antibiotics. Assessment and Plan: Acute respiratory failure with hypoxia and sepsis secondary to pneumonia due to H. influenzae bacteremia, present on admission. Meeting SIRS criteria, patient noted to be febrile up to 104 F with tachycardia and tachypnea on admission.  O2 saturations noted to be as low as 85% on room air with improvement on 2 L of nasal cannula oxygen.  He had reported having intermittent cough and was noted to have left lobe pneumonia on chest x-ray.  Question if secondary to aspiration.  Blood cultures have been attained and patient was started on empiric antibiotics of vancomycin and cefepime. Blood culture came back positive for H. influenzae.   Currently transitioning to IV ceftriaxone only.  MRSA PCR negative. Continue incentive spirometry and flutter valve Speech therapy recommended dysphagia diet.  Dysphagia. Esophageal dysmotility. Esophageal stricture. Recent EGD showing esophageal ulcer. Patient has longstanding dysphagia.  EGD in 2022 June showed evidence of esophageal stricture which was widely patent.  X-ray esophagus shows evidence of stricture again at this time on 04/21/2022. Although patient is able to tolerate dysphagia 3 diet therefore I will be holding off on recommending EGD for the patient. Outpatient follow-up with GI recommended.   Acute kidney injury superimposed on chronic kidney disease stage IIIa Patient presents with creatinine 1.64 with BUN 35.  Baseline creatinine had previously been around 1.18 last year. Urinalysis did not note  significant signs of infection. This is greater than a 0.3 increased from baseline to suggest acute kidney injury.   Avoid tolerance of medication. Continue with fluids.  Narrow complex tachycardia Elevated troponin Patient was noted to be in a narrow complex tachycardia with heart rates into the 150s.  He had been started on diltiazem, but converted back into a sinus rhythm reportedly with vagal maneuver. Echocardiogram shows preserved EF no valvular abnormality.  No wall motion abnormality.   Essential hypertension We will hold amlodipine. Continue metoprolol.   Macrocytic anemia Hemoglobin 11 on admission.  Baseline hemoglobin previously 9-10.  Dropped to 8.7 most likely dilutional in nature. Check B12 and folic acid.   BPH -Continue Flomax   GERD history of esophageal stricture Patient with history of gastritis, duodenitis, and esophageal stricture. -Speech therapy consulted for swallow evaluation.  May need to consult GI if signs of stricture appreciated on the x-ray esophagus. -Continue Protonix  Subjective: No nausea no vomiting no fever no chills.  No chest pain no abdominal pain.  Cough still present but fatigue still present.  Shortness of breath still present but all improving.  Physical Exam: Vitals:   04/21/22 0809 04/21/22 1300 04/21/22 1605 04/21/22 2024  BP: 136/66 (!) 146/64 (!) 146/57 (!) 141/59  Pulse: 80 71 75 82  Resp: 17 18 18 19   Temp: 97.8 F (36.6 C) (!) 97.5 F (36.4 C) 97.8 F (36.6 C) 98.6 F (37 C)  TempSrc: Oral Oral Oral Oral  SpO2: 95% 91% 92% 93%  Weight:      Height:       General: Appear in mild distress, no Rash; Oral Mucosa clear, moist. Cardiovascular: S1 and S2 Present, no Murmur, Respiratory: Increased respiratory effort,  Bilateral Air entry present and bilateral Crackles present , no wheezes Abdomen: Bowel Sound present, Soft and no tenderness Extremities: trace Pedal edema Neurology: alert and oriented to Self, place and  time affect appropriate. no new focal deficit  Data Reviewed: I have Reviewed nursing notes, Vitals, and Lab results since pt's last encounter. Pertinent lab results CBC and I have ordered test including CBC and BMP     Family Communication: No one at bedside.  Discussed with son on the phone.  Disposition: Status is: Inpatient Remains inpatient appropriate because: Bacteremia requiring IV antibiotics.  Author: Lynden Oxford, MD 04/21/2022 8:27 PM  Please look on [email or web address] to find out who is on call.

## 2022-04-21 NOTE — Consult Note (Signed)
[facility]                                                                                        Patient Identification: Patient Name: Ricky Sandoval MRN: [id number] Admit Date: 04/19/2022 11:30 AM Today's Date: 04/21/2022 Reason for consult: bacteremia  Requesting provider: Lynden Oxford   Principal Problem:   Sepsis due to pneumonia [facility]) Active Problems:   Narrow complex tachycardia (HCC)   Acute respiratory failure with hypoxia (HCC)   Acute kidney injury superimposed on chronic kidney disease (HCC)   Essential hypertension   Normocytic anemia   History of esophageal stricture   H. influenzae septicemia (HCC)   Antibiotics:  Vancomycin 8/23 Cefetriaxone 8/23 Cefepime 8/23 Azithromycin 8/23  Lines/Hardware: PIV   Assessment 88 Y O male with PMH of HTN, HLD, DM2, hypothyroidism, GERD/esophageal stricture s/p dilatation who was brought from ALF  in the setting of cough, some sob and sore throat. Admitted with   H influenzae bacteremia likely 2/2 below: No signs of meningitis. He answers questions appropriately  Pneumonia - on room air, cough very minimal if any Concerns of aspiration with esophageal stricture - Management per GI and primary  AKI, resolved   Recommendations  Continue ceftriaxone as is  Repeat blood cx/TTE has been ordered by primary and will follow Fu sensi of H influenza for de-escalation plans  Monitor CBC and CMP  Dr Thedore Mins covering this weekend and will follow cultures. New ID team to follow from Monday  Rest of the management as per the primary team. Please call with questions or concerns.  Thank you for the consult  Odette Fraction, MD Infectious Disease Physician [facility] [address] [place], [place] [address] Phone: [phone]  Fax:  [phone]  __________________________________________________________________________________________________________ HPI and Hospital Course: 79 Y O male with PMH of HTN, HLD, DM2, hypothyroidism, GERD/esophageal stricture s/p dilatation who was brought from ALF  in the setting of cough, some sob and sore throat. Patient is a poor historian and most of the history is obtained from EMR. Cough started after breakfast the day of ED arrvial. Staff had noted mucus and food coming out and there was concern for foof getting stuck in his throat.  At baseline, he is non ambulatory and gets around with the use of a wheel chair.   At ED , febrile, tachycardic, spo2 85% and improved with 2L [place], started on IVF and broad spectrum abtx for sepsis. Tachycardia resolved after vagal maneuver Lab remarkable for Cr 1.64, WBC 7.3, LA 2.2   Imaging as below with concerns for PNA  Blood cx 8/23 both sets GNR, BCID as H influenzae   Patient seen post echocardiogram. He tells me he was having some SOB and cough which made him come to the hospital. Denies prior fevers, chills, headache, neck pain. Denies nausea, vomiting and diarrhea. He is ambulatory with the help of wheel chair. No complaints now.   ROS:all systems reviewed with pertinent positives and negatives as listed above   Past Medical History:  Diagnosis Date   Diabetes type 2, controlled (  HCC)    High cholesterol    Hypertension    Hypothyroid    Past Surgical History:  Procedure Laterality Date   BALLOON DILATION N/A 01/30/2021   Procedure: BALLOON DILATION;  Surgeon: Hilarie FredricksonPerry, John N, MD;  [facility];  Service: Endoscopy;  Laterality: N/A;   BIOPSY  01/30/2021   Procedure: BIOPSY;  Surgeon: Hilarie FredricksonPerry, John N, MD;  [facility];  Service: Endoscopy;;   BIOPSY  02/01/2021   Procedure: BIOPSY;  Surgeon: Meryl DareStark, Malcolm T, MD;  [facility];  Service: Endoscopy;;   BIOPSY  02/08/2021   Procedure: BIOPSY;  Surgeon: Tressia DanasBeavers,  Kimberly, MD;  Location: [facility] ENDOSCOPY;  Service: Gastroenterology;;   ESOPHAGOGASTRODUODENOSCOPY N/A 02/01/2021   Procedure: ESOPHAGOGASTRODUODENOSCOPY (EGD);  Surgeon: Meryl DareStark, Malcolm T, MD;  [facility];  Service: Endoscopy;  Laterality: N/A;   ESOPHAGOGASTRODUODENOSCOPY (EGD) WITH PROPOFOL N/A 01/30/2021   Procedure: ESOPHAGOGASTRODUODENOSCOPY (EGD) WITH PROPOFOL;  Surgeon: Hilarie FredricksonPerry, John N, MD;  [facility];  Service: Endoscopy;  Laterality: N/A;   ESOPHAGOGASTRODUODENOSCOPY (EGD) WITH PROPOFOL N/A 02/08/2021   Procedure: ESOPHAGOGASTRODUODENOSCOPY (EGD) WITH PROPOFOL;  Surgeon: Tressia DanasBeavers, Kimberly, MD;  Location: [facility] ENDOSCOPY;  Service: Gastroenterology;  Laterality: N/A;   FOREIGN BODY REMOVAL  01/30/2021   Procedure: FOREIGN BODY REMOVAL;  Surgeon: Hilarie FredricksonPerry, John N, MD;  [facility];  Service: Endoscopy;;   SAVORY DILATION N/A 02/01/2021   Procedure: Gaspar BiddingSAVORY DILATION;  Surgeon: Meryl DareStark, Malcolm T, MD;  [facility];  Service: Endoscopy;  Laterality: N/A;     Scheduled Meds:  aspirin EC  81 mg Oral Daily   docusate sodium  300 mg Oral Daily   enoxaparin (LOVENOX) injection  40 mg Subcutaneous Q24H   feeding supplement  237 mL Oral BID BM   fluticasone  1 spray Each Nare q AM   ketotifen  1 drop Both Eyes TID   levothyroxine  75 mcg Oral Q0600   loratadine  10 mg Oral Daily   multivitamin with minerals  1 tablet Oral Daily   nortriptyline  25 mg Oral QHS   pantoprazole  40 mg Oral BID AC   pneumococcal 20-valent conjugate vaccine  0.5 mL Intramuscular Tomorrow-1000   polyvinyl alcohol  2 drop Both Eyes BID   simvastatin  20 mg Oral QHS   sucralfate  1 g Oral BID WC   tamsulosin  0.4 mg Oral QHS   triamcinolone ointment  1 Application Topical BID   Continuous Infusions:  sodium chloride 75 mL/hr at 04/20/22 2257   cefTRIAXone (ROCEPHIN)  IV 2 g (04/20/22 1819)   PRN Meds:.acetaminophen **OR** acetaminophen, levalbuterol, lip balm,  menthol-cetylpyridinium, ondansetron **OR** ondansetron (ZOFRAN) IV, mouth rinse  Allergies  Allergen Reactions   Zestril [Lisinopril] Other (See Comments)    Airway constriction   Social History   Socioeconomic History   Marital status: Widowed    Spouse name: Not on file   Number of children: Not on file   Years of education: Not on file   Highest education level: Not on file  Occupational History   Not on file  Tobacco Use   Smoking status: Never   Smokeless tobacco: Never  Vaping Use   Vaping Use: Never used  Substance and Sexual Activity   Alcohol use: Not Currently   Drug use: Never   Sexual activity: Not Currently  Other Topics Concern   Not on file  Social History Narrative   Not on file   Social Determinants of Health   Financial Resource Strain:  Not on file  Food Insecurity: Not on file  Transportation Needs: Not on file  Physical Activity: Not on file  Stress: Not on file  Social Connections: Not on file  Intimate Partner Violence: Not on file   No family history on file.  Vitals  BP (!) 124/53 (BP Location: Left Arm)   Pulse 74   Temp 98.2 F (36.8 C) (Oral)   Resp 19   Ht 5\' 5"  (1.651 m)   Wt 70.1 kg   SpO2 93%   BMI 25.72 kg/m   Physical Exam Constitutional:  elderly caucasian male lying in the bed, appears comfortable in room air     Comments:   Cardiovascular:     Rate and Rhythm: Normal rate and regular rhythm.     Heart sounds:   Pulmonary:     Effort: Pulmonary effort is normal on room air     Comments:  rhonchi in the left side  Abdominal:     Palpations: Abdomen is soft.     Tenderness: non distended and non tender  Musculoskeletal:        General: No swelling or tenderness in peripheral joints , arthritic changes noted in extremities   Skin:    Comments: Back not examined   Neurological:     General: awake, alert, oriented, answers questions appropriately   Psychiatric:        Mood and Affect: Mood normal.     Pertinent Microbiology Results for orders placed or performed during the hospital encounter of 04/19/22  SARS Coronavirus 2 by RT PCR (hospital order, performed in [facility] hospital lab) *cepheid single result test* Anterior Nasal Swab     Status: None   Collection Time: 04/19/22 12:14 PM   Specimen: Anterior Nasal Swab  Result Value Ref Range Status   SARS Coronavirus 2 by RT PCR NEGATIVE NEGATIVE Final    Comment: (NOTE) SARS-CoV-2 target nucleic acids are NOT DETECTED.  The SARS-CoV-2 RNA is generally detectable in upper and lower respiratory specimens during the acute phase of infection. The lowest concentration of SARS-CoV-2 viral copies this assay can detect is 250 copies / mL. A negative result does not preclude SARS-CoV-2 infection and should not be used as the sole basis for treatment or other patient management decisions.  A negative result may occur with improper specimen collection / handling, submission of specimen other than nasopharyngeal swab, presence of viral mutation(s) within the areas targeted by this assay, and inadequate number of viral copies (<250 copies / mL). A negative result must be combined with clinical observations, patient history, and epidemiological information.  Fact Sheet for Patients:   04/21/22  Fact Sheet for Healthcare Providers: RoadLapTop.co.za  This test is not yet approved or  cleared by the http://kim-miller.com/ FDA and has been authorized for detection and/or diagnosis of SARS-CoV-2 by FDA under an Emergency Use Authorization (EUA).  This EUA will remain in effect (meaning this test can be used) for the duration of the COVID-19 declaration under Section 564(b)(1) of the Act, 21 U.S.C. section 360bbb-3(b)(1), unless the authorization is terminated or revoked sooner.  Performed at [facility] Lab, [address], [place], [place]   Blood culture (routine x 2)      Status: None (Preliminary result)   Collection Time: 04/19/22  1:11 PM   Specimen: BLOOD  Result Value Ref Range Status   Specimen Description BLOOD LEFT ANTECUBITAL  Final   Special Requests   Final    BOTTLES  DRAWN AEROBIC AND ANAEROBIC Blood Culture adequate volume   Culture  Setup Time   Final    GRAM NEGATIVE RODS ANAEROBIC BOTTLE ONLY CRITICAL RESULT CALLED TO, READ BACK BY AND VERIFIED WITH: Nedra Hai [id number] @1708  FH Performed at [facility], [address], [place], [place]    Culture GRAM NEGATIVE RODS  Final   Report Status PENDING  Incomplete  MRSA Next Gen by PCR, Nasal     Status: None   Collection Time: 04/19/22  1:24 PM   Specimen: Nasal Mucosa; Nasal Swab  Result Value Ref Range Status   MRSA by PCR Next Gen NOT DETECTED NOT DETECTED Final    Comment: (NOTE) The GeneXpert MRSA Assay (FDA approved for NASAL specimens only), is one component of a comprehensive MRSA colonization surveillance program. It is not intended to diagnose MRSA infection nor to guide or monitor treatment for MRSA infections. Test performance is not FDA approved in patients less than 50 years old. Performed at [facility], [address], [place], [place]   Blood culture (routine x 2)     Status: None (Preliminary result)   Collection Time: 04/19/22  1:25 PM   Specimen: BLOOD RIGHT FOREARM  Result Value Ref Range Status   Specimen Description BLOOD RIGHT FOREARM  Final   Special Requests   Final    BOTTLES DRAWN AEROBIC AND ANAEROBIC Blood Culture adequate volume   Culture  Setup Time   Final    GRAM NEGATIVE RODS AEROBIC BOTTLE ONLY CRITICAL RESULT CALLED TO, READ BACK BY AND VERIFIED WITH: 04/21/22 Nedra Hai @1708  FH Performed at [facility], [address] [place], [address] [place]    Culture GRAM NEGATIVE RODS  Final   Report Status PENDING  Incomplete  Blood Culture ID Panel (Reflexed)     Status: Abnormal   Collection Time:  04/19/22  1:25 PM  Result Value Ref Range Status   Enterococcus faecalis NOT DETECTED NOT DETECTED Final   Enterococcus Faecium NOT DETECTED NOT DETECTED Final   Listeria monocytogenes NOT DETECTED NOT DETECTED Final   Staphylococcus species NOT DETECTED NOT DETECTED Final   Staphylococcus aureus (BCID) NOT DETECTED NOT DETECTED Final   Staphylococcus epidermidis NOT DETECTED NOT DETECTED Final   Staphylococcus lugdunensis NOT DETECTED NOT DETECTED Final   Streptococcus species NOT DETECTED NOT DETECTED Final   Streptococcus agalactiae NOT DETECTED NOT DETECTED Final   Streptococcus pneumoniae NOT DETECTED NOT DETECTED Final   Streptococcus pyogenes NOT DETECTED NOT DETECTED Final   A.calcoaceticus-baumannii NOT DETECTED NOT DETECTED Final   Bacteroides fragilis NOT DETECTED NOT DETECTED Final   Enterobacterales NOT DETECTED NOT DETECTED Final   Enterobacter cloacae complex NOT DETECTED NOT DETECTED Final   Escherichia coli NOT DETECTED NOT DETECTED Final   Klebsiella aerogenes NOT DETECTED NOT DETECTED Final   Klebsiella oxytoca NOT DETECTED NOT DETECTED Final   Klebsiella pneumoniae NOT DETECTED NOT DETECTED Final   Proteus species NOT DETECTED NOT DETECTED Final   Salmonella species NOT DETECTED NOT DETECTED Final   Serratia marcescens NOT DETECTED NOT DETECTED Final   Haemophilus influenzae DETECTED (A) NOT DETECTED Final    Comment: CRITICAL RESULT CALLED TO, READ BACK BY AND VERIFIED WITH: PHARMD MING P. [id number] @1708  FH    Neisseria meningitidis NOT DETECTED NOT DETECTED Final   Pseudomonas aeruginosa NOT DETECTED NOT DETECTED Final   Stenotrophomonas maltophilia NOT DETECTED NOT DETECTED Final   Candida albicans NOT DETECTED NOT  DETECTED Final   Candida auris NOT DETECTED NOT DETECTED Final   Candida glabrata NOT DETECTED NOT DETECTED Final   Candida krusei NOT DETECTED NOT DETECTED Final   Candida parapsilosis NOT DETECTED NOT DETECTED Final   Candida tropicalis NOT  DETECTED NOT DETECTED Final   Cryptococcus neoformans/gattii NOT DETECTED NOT DETECTED Final    Comment: Performed at [facility], [address], [place], [place] [address]    Pertinent Lab seen by me:    Latest Ref Rng & Units 04/20/2022    1:01 AM 04/19/2022   11:50 AM 02/10/2021   10:27 AM  CBC  WBC 4.0 - 10.5 K/uL 8.3  7.3    Hemoglobin 13.0 - 17.0 g/dL 8.7  26.3  9.7   Hematocrit 39.0 - 52.0 % 25.9  34.0  29.1   Platelets 150 - 400 K/uL 168  208        Latest Ref Rng & Units 04/20/2022    1:01 AM 04/19/2022   11:50 AM 02/08/2021    4:35 AM  CMP  Glucose 70 - 99 mg/dL 785  885  027   BUN 8 - 23 mg/dL 31  35  23   Creatinine 0.61 - 1.24 mg/dL 7.41  2.87  8.67   Sodium 135 - 145 mmol/L 141  139  139   Potassium 3.5 - 5.1 mmol/L 3.5  3.8  4.1   Chloride 98 - 111 mmol/L 110  105  105   CO2 22 - 32 mmol/L 23  22  28    Calcium 8.9 - 10.3 mg/dL 7.9  8.7  8.9   Total Protein 6.5 - 8.1 g/dL  6.7    Total Bilirubin 0.3 - 1.2 mg/dL  0.8    Alkaline Phos 38 - 126 U/L  61    AST 15 - 41 U/L  31    ALT 0 - 44 U/L  19       Pertinent Imagings/Other Imagings Plain films and CT images have been personally visualized and interpreted; radiology reports have been reviewed. Decision making incorporated into the Impression / Recommendations.DG ESOPHAGUS W SINGLE CM (SOL OR THIN BA)  Result Date: 04/21/2022 CLINICAL DATA:  88 year old male with a history of prior esophageal ulcer, prior esophageal stricture and prior esophageal dilatation presenting with ongoing solid-food dysphagia. EXAM: ESOPHAGUS/BARIUM SWALLOW/TABLET STUDY TECHNIQUE: A single contrast examination was performed using thin liquid barium. The patient also swallowed a 13 mm barium tablet under fluoroscopy. The exam was performed by 92, PA-C, and was supervised and interpreted by Dr. Lawernce Ion FLUOROSCOPY: Fluoroscopy time: 2 minutes, 48 seconds (19.70 mGy). COMPARISON:  Esophagram 09/07/2006. FINDINGS: Limited  examination due to the patient's inability to stand and limited ability to reposition on the fluoroscopy table. All imaging was acquired with the patient in the semi-recumbent position. Marked intermittent esophageal dysmotility with tertiary contractions. A swallowed 13 mm barium tablet did not pass beyond the level of the midesophagus despite a prolonged period of observation. Findings raise the possibility of a mid esophageal stricture, although no definite fixed narrowing was appreciated at this site (within described limitations). Elsewhere, normal caliber and smooth contour of the esophagus. Small hiatal hernia. No gastroesophageal reflux was observed. IMPRESSION: Limited examination due to the patient's inability to stand and limited ability to reposition on the fluoroscopy table. All imaging was acquired with the patient in the semi-recumbent position. Marked intermittent esophageal dysmotility with tertiary contractions. A swallowed 13 mm barium tablet did not pass beyond  the level of the midesophagus despite a prolonged period of observation. Findings raise concern for a mid esophageal stricture, although no definite fixed narrowing was appreciated at this site. Consider endoscopy for further evaluation. Small hiatal hernia. Electronically Signed   By: Jackey Loge D.O.   On: 04/21/2022 11:21   DG Chest 2 View  Result Date: 04/19/2022 CLINICAL DATA:  Cough. EXAM: CHEST - 2 VIEW COMPARISON:  January 30, 2021. FINDINGS: Stable cardiomediastinal silhouette. Right lung is clear. Left lower lobe airspace opacity is noted concerning for pneumonia. Bony thorax is unremarkable. IMPRESSION: Left lower lobe airspace opacity is noted concerning for pneumonia. Followup PA and lateral chest X-ray is recommended in 3-4 weeks following trial of antibiotic therapy to ensure resolution and exclude underlying malignancy. Electronically Signed   By: Lupita Raider M.D.   On: 04/19/2022 12:55     I spent 100  minutes for  this patient encounter including review of prior medical records/discussing diagnostics and treatment plan with the patient/family/coordinate care with primary/other specialits with greater than 50% of time in face to face encounter.   Electronically signed by:   Odette Fraction, MD Infectious Disease Physician [facility] Pager: [phone]

## 2022-04-22 DIAGNOSIS — Z8719 Personal history of other diseases of the digestive system: Secondary | ICD-10-CM | POA: Diagnosis not present

## 2022-04-22 DIAGNOSIS — J189 Pneumonia, unspecified organism: Secondary | ICD-10-CM | POA: Diagnosis not present

## 2022-04-22 DIAGNOSIS — N179 Acute kidney failure, unspecified: Secondary | ICD-10-CM | POA: Diagnosis not present

## 2022-04-22 DIAGNOSIS — J9601 Acute respiratory failure with hypoxia: Secondary | ICD-10-CM | POA: Diagnosis not present

## 2022-04-22 LAB — CBC
HCT: 28.2 % — ABNORMAL LOW (ref 39.0–52.0)
Hemoglobin: 9.6 g/dL — ABNORMAL LOW (ref 13.0–17.0)
MCH: 34.9 pg — ABNORMAL HIGH (ref 26.0–34.0)
MCHC: 34 g/dL (ref 30.0–36.0)
MCV: 102.5 fL — ABNORMAL HIGH (ref 80.0–100.0)
Platelets: 228 10*3/uL (ref 150–400)
RBC: 2.75 MIL/uL — ABNORMAL LOW (ref 4.22–5.81)
RDW: 12.3 % (ref 11.5–15.5)
WBC: 8.7 10*3/uL (ref 4.0–10.5)
nRBC: 0 % (ref 0.0–0.2)

## 2022-04-22 LAB — BASIC METABOLIC PANEL
Anion gap: 5 (ref 5–15)
BUN: 15 mg/dL (ref 8–23)
CO2: 25 mmol/L (ref 22–32)
Calcium: 8.3 mg/dL — ABNORMAL LOW (ref 8.9–10.3)
Chloride: 107 mmol/L (ref 98–111)
Creatinine, Ser: 0.92 mg/dL (ref 0.61–1.24)
GFR, Estimated: >60 mL/min (ref >60)
Glucose, Bld: 98 mg/dL (ref 70–99)
Potassium: 3.9 mmol/L (ref 3.5–5.1)
Sodium: 137 mmol/L (ref 135–145)

## 2022-04-22 LAB — MAGNESIUM: Magnesium: 1.9 mg/dL (ref 1.7–2.4)

## 2022-04-22 LAB — TSH: TSH: 0.516 u[IU]/mL (ref 0.350–4.500)

## 2022-04-22 LAB — FOLATE: Folate: 8.4 ng/mL (ref >5.9)

## 2022-04-22 LAB — VITAMIN B12: Vitamin B-12: 251 pg/mL (ref 180–914)

## 2022-04-22 MED ORDER — VITAMIN B-12 1000 MCG PO TABS
1000.0000 ug | ORAL_TABLET | Freq: Every day | ORAL | Status: DC
Start: 2022-04-23 — End: 2022-04-27
  Administered 2022-04-23 – 2022-04-27 (×5): 1000 ug via ORAL
  Filled 2022-04-22 (×5): qty 1

## 2022-04-22 MED ORDER — CYANOCOBALAMIN 1000 MCG/ML IJ SOLN
1000.0000 ug | Freq: Once | INTRAMUSCULAR | Status: AC
  Administered 2022-04-22: 1000 ug via SUBCUTANEOUS
  Filled 2022-04-22: qty 1

## 2022-04-22 MED ORDER — FOLIC ACID 1 MG PO TABS
1.0000 mg | ORAL_TABLET | Freq: Every day | ORAL | Status: DC
  Administered 2022-04-22 – 2022-04-27 (×6): 1 mg via ORAL
  Filled 2022-04-22 (×6): qty 1

## 2022-04-22 NOTE — Progress Notes (Signed)
Progress Note Patient: Ricky Sandoval OJJ:[id number] DOB: September 01, 1921 DOA: 04/19/2022  DOS: the patient was seen and examined on 04/22/2022  Brief hospital course: PMH of HTN, HLD, type-shortness of breath. Found to have acute respiratory failure with hypoxia and sepsis secondary to left lower lobe pneumonia secondary to H. influenzae infection and bacteremia along with AKI on CKD 3a. Currently plan is to continue IV antibiotics.  ID consulted.  Echocardiogram unremarkable for any vegetation or acute abnormality.  Repeat cultures currently pending. Assessment and Plan: Acute respiratory failure with hypoxia and sepsis secondary to pneumonia due to H. influenzae bacteremia, present on admission. Meeting SIRS criteria, patient noted to be febrile up to 104 F with tachycardia and tachypnea on admission.  O2 saturations noted to be as low as 85% on room air with improvement on 2 L of nasal cannula oxygen.  He had reported having intermittent cough and was noted to have left lobe pneumonia on chest x-ray.  Question if secondary to aspiration.  Blood cultures have been attained and patient was started on empiric antibiotics of vancomycin and cefepime. Blood culture came back positive for H. influenzae.   Currently transitioning to IV ceftriaxone only.  MRSA PCR negative. Continue incentive spirometry and flutter valve Speech therapy recommended dysphagia diet.   Dysphagia. Esophageal dysmotility. Esophageal stricture. Recent EGD showing esophageal ulcer. Patient has longstanding dysphagia.  EGD in 2022 June showed evidence of esophageal stricture which was widely patent.  X-ray esophagus shows evidence of stricture again at this time on 04/21/2022. Although patient is able to tolerate dysphagia 3 diet therefore I will be holding off on recommending EGD for the patient. Outpatient follow-up with GI recommended.   Acute kidney injury superimposed on chronic kidney disease stage IIIa Patient presents  with creatinine 1.64 with BUN 35.  Baseline creatinine had previously been around 1.18 last year. Urinalysis did not note significant signs of infection. This is greater than a 0.3 increased from baseline to suggest acute kidney injury.   Avoid tolerance of medication. Continue with fluids.   Narrow complex tachycardia Elevated troponin Patient was noted to be in a narrow complex tachycardia with heart rates into the 150s.  He had been started on diltiazem, but converted back into a sinus rhythm reportedly with vagal maneuver. Echocardiogram shows preserved EF no valvular abnormality.  No wall motion abnormality.   Essential hypertension Blood pressure minimally elevated. Resume amlodipine. Continue metoprolol.   Macrocytic anemia Relative B12 deficiency Baseline hemoglobin around 9. Currently hemoglobin remaining stable in that range of 9.0-9.6.   B12 relatively low at 251, folic acid relatively low.  We will replace.  BPH Continue Flomax   GERD Continue Protonix  Subjective: No nausea no vomiting.  Continues to have cough.  No fever no chills no chest pain.  Physical Exam: Vitals:   04/22/22 0509 04/22/22 0530 04/22/22 0831 04/22/22 1514  BP: (!) 153/78   (!) 152/67  Pulse: 76 73 77 82  Resp: 20 17 15 16   Temp: 98.4 F (36.9 C)   98 F (36.7 C)  TempSrc: Oral   Oral  SpO2: 94% 92% 96%   Weight:  74.6 kg    Height:       General: Appear in mild distress; no visible Abnormal Neck Mass Or lumps, Conjunctiva normal Cardiovascular: S1 and S2 Present, aortic systolic  Murmur, Respiratory: good respiratory effort, Bilateral Air entry present and bilateral  Crackles, no wheezes Abdomen: Bowel Sound present, Non tender  Extremities: no Pedal edema Neurology: alert  and oriented to time, place, and person  Gait not checked due to patient safety concerns   Data Reviewed: I have Reviewed nursing notes, Vitals, and Lab results since pt's last encounter. Pertinent lab results  CBC and BMP I have ordered test including CBC and BMP   ID was consulted  Family Communication: No one at bedside  Disposition: Status is: Inpatient Remains inpatient appropriate because: Acute antibiotic until blood cultures are negative  Author: Lynden Oxford, MD 04/22/2022 6:02 PM  Please look on [email or web address] to find out who is on call.

## 2022-04-23 DIAGNOSIS — J189 Pneumonia, unspecified organism: Secondary | ICD-10-CM | POA: Diagnosis not present

## 2022-04-23 DIAGNOSIS — A419 Sepsis, unspecified organism: Secondary | ICD-10-CM | POA: Diagnosis not present

## 2022-04-23 LAB — BASIC METABOLIC PANEL
Anion gap: 7 (ref 5–15)
BUN: 12 mg/dL (ref 8–23)
CO2: 26 mmol/L (ref 22–32)
Calcium: 8.1 mg/dL — ABNORMAL LOW (ref 8.9–10.3)
Chloride: 103 mmol/L (ref 98–111)
Creatinine, Ser: 0.87 mg/dL (ref 0.61–1.24)
GFR, Estimated: >60 mL/min (ref >60)
Glucose, Bld: 88 mg/dL (ref 70–99)
Potassium: 3.7 mmol/L (ref 3.5–5.1)
Sodium: 136 mmol/L (ref 135–145)

## 2022-04-23 LAB — CBC
HCT: 27.1 % — ABNORMAL LOW (ref 39.0–52.0)
Hemoglobin: 9.2 g/dL — ABNORMAL LOW (ref 13.0–17.0)
MCH: 34.2 pg — ABNORMAL HIGH (ref 26.0–34.0)
MCHC: 33.9 g/dL (ref 30.0–36.0)
MCV: 100.7 fL — ABNORMAL HIGH (ref 80.0–100.0)
Platelets: 245 10*3/uL (ref 150–400)
RBC: 2.69 MIL/uL — ABNORMAL LOW (ref 4.22–5.81)
RDW: 12.1 % (ref 11.5–15.5)
WBC: 8.7 10*3/uL (ref 4.0–10.5)
nRBC: 0 % (ref 0.0–0.2)

## 2022-04-23 LAB — MAGNESIUM: Magnesium: 1.7 mg/dL (ref 1.7–2.4)

## 2022-04-23 MED ORDER — DOCUSATE SODIUM 100 MG PO CAPS
200.0000 mg | ORAL_CAPSULE | Freq: Every day | ORAL | Status: DC
  Administered 2022-04-24 – 2022-04-27 (×4): 200 mg via ORAL
  Filled 2022-04-23 (×5): qty 2

## 2022-04-23 NOTE — Progress Notes (Signed)
Progress Note Patient: Ricky Sandoval ZYS:[id number] DOB: Aug 20, 1922 DOA: 04/19/2022  DOS: the patient was seen and examined on 04/23/2022  Brief hospital course: PMH of HTN, HLD, type-shortness of breath. Found to have acute respiratory failure with hypoxia and sepsis secondary to left lower lobe pneumonia secondary to H. influenzae infection and bacteremia along with AKI on CKD 3a. Currently plan is to continue IV antibiotics.  ID consulted.  Echocardiogram unremarkable for any vegetation or acute abnormality.  Repeat cultures currently pending. Assessment and Plan: Acute respiratory failure with hypoxia and sepsis secondary to pneumonia due to H. influenzae bacteremia, present on admission. Meeting SIRS criteria, patient noted to be febrile up to 104 F with tachycardia and tachypnea on admission.  O2 saturations noted to be as low as 85% on room air with improvement on 2 L of nasal cannula oxygen.  He had reported having intermittent cough and was noted to have left lobe pneumonia on chest x-ray.  Question if secondary to aspiration.  Blood cultures have been attained and patient was started on empiric antibiotics of vancomycin and cefepime. Blood culture came back positive for H. influenzae.  Repeat blood cultures negative for 2 days. ID following.  Will follow recommendation. Currently transitioning to IV ceftriaxone only.  MRSA PCR negative. Continue incentive spirometry and flutter valve Speech therapy recommended dysphagia diet.   Dysphagia. Esophageal dysmotility. Esophageal stricture. Recent EGD showing esophageal ulcer. Patient has longstanding dysphagia.  EGD in 2022 June showed evidence of esophageal stricture which was widely patent.  X-ray esophagus shows evidence of stricture again at this time on 04/21/2022. Although patient is able to tolerate dysphagia 3 diet therefore I will be holding off on recommending EGD for the patient. Outpatient follow-up with GI recommended.    Acute kidney injury superimposed on chronic kidney disease stage IIIa Patient presents with creatinine 1.64 with BUN 35.  Baseline creatinine had previously been around 1.18 last year. Urinalysis did not note significant signs of infection. This is greater than a 0.3 increased from baseline to suggest acute kidney injury.   Treated with fluids.   Narrow complex tachycardia Elevated troponin Patient was noted to be in a narrow complex tachycardia with heart rates into the 150s.  He had been started on diltiazem, but converted back into a sinus rhythm reportedly with vagal maneuver. Echocardiogram shows preserved EF no valvular abnormality.  No wall motion abnormality.   Essential hypertension Blood pressure minimally elevated. Resume amlodipine. Continue metoprolol.   Macrocytic anemia Relative B12 deficiency Baseline hemoglobin around 9. Currently hemoglobin remaining stable in that range of 9.0-9.6.   B12 relatively low at 251, folic acid relatively low.  We will replace.  BPH Continue Flomax   GERD Continue Protonix  Subjective: Cough and shortness of breath still present.  No nausea no vomiting no fever no chills.  Physical Exam: Vitals:   04/23/22 0644 04/23/22 0759 04/23/22 1544 04/23/22 1546  BP: (!) 145/63   (!) 142/53  Pulse: 74   78  Resp: 15 15 15 15   Temp: 97.7 F (36.5 C)   98.5 F (36.9 C)  TempSrc: Axillary   Oral  SpO2:    99%  Weight: 77 kg     Height:       General: Appear in mild distress; no visible Abnormal Neck Mass Or lumps, Conjunctiva normal Cardiovascular: S1 and S2 Present, aortic systolic  Murmur, Respiratory: good respiratory effort, Bilateral Air entry present and bilateral Crackles, no wheezes Abdomen: Bowel Sound present, Non tender Extremities:  no Pedal edema Neurology: alert and oriented to Self, Place and time.  Data Reviewed: I have Reviewed nursing notes, Vitals, and Lab results since pt's last encounter. Pertinent lab results  CBC and BMP I have ordered test including CBC and BMP   Family Communication: No one at bedside  Disposition: Status is: Inpatient Remains inpatient appropriate because: Continue IV antibiotics.  Await ID and PT OT recommendation.  Author: Lynden Oxford, MD 04/23/2022 4:05 PM  Please look on [email or web address] to find out who is on call.

## 2022-04-24 DIAGNOSIS — A419 Sepsis, unspecified organism: Secondary | ICD-10-CM | POA: Diagnosis not present

## 2022-04-24 DIAGNOSIS — Z8719 Personal history of other diseases of the digestive system: Secondary | ICD-10-CM | POA: Diagnosis not present

## 2022-04-24 DIAGNOSIS — A413 Sepsis due to Hemophilus influenzae: Secondary | ICD-10-CM

## 2022-04-24 DIAGNOSIS — J189 Pneumonia, unspecified organism: Secondary | ICD-10-CM | POA: Diagnosis not present

## 2022-04-24 LAB — CBC
HCT: 28.4 % — ABNORMAL LOW (ref 39.0–52.0)
Hemoglobin: 9.8 g/dL — ABNORMAL LOW (ref 13.0–17.0)
MCH: 34.6 pg — ABNORMAL HIGH (ref 26.0–34.0)
MCHC: 34.5 g/dL (ref 30.0–36.0)
MCV: 100.4 fL — ABNORMAL HIGH (ref 80.0–100.0)
Platelets: 293 10*3/uL (ref 150–400)
RBC: 2.83 MIL/uL — ABNORMAL LOW (ref 4.22–5.81)
RDW: 11.9 % (ref 11.5–15.5)
WBC: 8.3 10*3/uL (ref 4.0–10.5)
nRBC: 0 % (ref 0.0–0.2)

## 2022-04-24 LAB — LEGIONELLA PNEUMOPHILA SEROGP 1 UR AG: L. pneumophila Serogp 1 Ur Ag: NEGATIVE

## 2022-04-24 LAB — BASIC METABOLIC PANEL
Anion gap: 8 (ref 5–15)
BUN: 11 mg/dL (ref 8–23)
CO2: 30 mmol/L (ref 22–32)
Calcium: 8.5 mg/dL — ABNORMAL LOW (ref 8.9–10.3)
Chloride: 101 mmol/L (ref 98–111)
Creatinine, Ser: 1.03 mg/dL (ref 0.61–1.24)
GFR, Estimated: >60 mL/min (ref >60)
Glucose, Bld: 93 mg/dL (ref 70–99)
Potassium: 3.8 mmol/L (ref 3.5–5.1)
Sodium: 139 mmol/L (ref 135–145)

## 2022-04-24 LAB — MAGNESIUM: Magnesium: 1.6 mg/dL — ABNORMAL LOW (ref 1.7–2.4)

## 2022-04-24 MED ORDER — AMOXICILLIN 500 MG PO CAPS
1000.0000 mg | ORAL_CAPSULE | Freq: Three times a day (TID) | ORAL | Status: AC
Start: 2022-04-24 — End: 2022-04-25
  Administered 2022-04-24 – 2022-04-25 (×5): 1000 mg via ORAL
  Filled 2022-04-24 (×5): qty 2

## 2022-04-24 MED ORDER — AMOXICILLIN 500 MG PO CAPS
500.0000 mg | ORAL_CAPSULE | Freq: Three times a day (TID) | ORAL | Status: DC
Start: 2022-04-24 — End: 2022-04-24
  Filled 2022-04-24: qty 1

## 2022-04-24 NOTE — Progress Notes (Signed)
Mobility Specialist Progress Note    04/24/22 1541  Mobility  Activity Transferred from chair to bed  Level of Assistance +2 (takes two people)  Assistive Device MaxiMove  Activity Response Tolerated well  $Mobility charge 1 Mobility   Pt received and agreeable. No complaints. Left with call bell in reach.    [place] Mobility Specialist

## 2022-04-24 NOTE — Evaluation (Signed)
Physical Therapy Evaluation/Discharge Patient Details Name: Ricky Sandoval MRN: [id number] DOB: February 05, 1922 Today's Date: 04/24/2022  History of Present Illness  Patient is a 88 yo male presenting to the ED on 04/19/22 with cough and fever. Admitted same day with pneumonia, AKI on CKD 3a, and tachycardia. PMH includes: hypertension hyperlipidemia, diabetes mellitus type 2, CKD 3a, and hypothyroidism  Clinical Impression  Pt presents to PT at or very close to baseline with mobility. Pt reports that at ALF two strong people pick him up and put him in the wheelchair. Used maximove lift to get pt to chair. Recommend continued OOB with lift. No skilled PT needs. Signing off.        Recommendations for follow up therapy are one component of a multi-disciplinary discharge planning process, led by the attending physician.  Recommendations may be updated based on patient status, additional functional criteria and insurance authorization.  Follow Up Recommendations Long-term institutional care without follow-up therapy Can patient physically be transported by private vehicle: No    Assistance Recommended at Discharge Frequent or constant Supervision/Assistance  Patient can return home with the following  Two people to help with walking and/or transfers;Two people to help with bathing/dressing/bathroom;Direct supervision/assist for medications management    Equipment Recommendations None recommended by PT  Recommendations for Other Services       Functional Status Assessment Patient has not had a recent decline in their functional status     Precautions / Restrictions Precautions Precautions: Fall Restrictions Weight Bearing Restrictions: No      Mobility  Bed Mobility Overal bed mobility: Needs Assistance Bed Mobility: Supine to Sit, Sit to Supine, Rolling Rolling: Total assist   Supine to sit: +2 for physical assistance, Total assist Sit to supine: +2 for physical assistance, Total  assist   General bed mobility comments: Assist for all aspects    Transfers Overall transfer level: Needs assistance Equipment used: Ambulation equipment used Transfers: Bed to chair/wheelchair/BSC             General transfer comment: Attempted to stand with +2 assist using [employer]. Unable to come to standing. Buttocks ~6" from bed due to lifting of therapist. Returned to supine and used maximove to recliner. Transfer via Lift Equipment: [employer]  Ambulation/Gait                  Information systems manager [employer] (Stroke Patients Only)       Balance Overall balance assessment: Needs assistance Sitting-balance support: Bilateral upper extremity supported, Feet supported Sitting balance-Leahy Scale: Poor Sitting balance - Comments: UE support and min assist to maintain static sitting Postural control: Posterior lean                                   Pertinent Vitals/Pain Pain Assessment Pain Assessment: Faces Faces Pain Scale: Hurts a little bit Pain Location: generalized Pain Descriptors / Indicators: Grimacing Pain Intervention(s): Limited activity within patient's tolerance, Monitored during session, Repositioned    Home Living Family/patient expects to be discharged to:: Assisted living                        Prior Function Prior Level of Function : Needs assist       Physical Assist : Mobility (physical);ADLs (physical) Mobility (physical): Bed mobility;Transfers ADLs (physical): Grooming;Bathing;Dressing;Toileting;IADLs Mobility Comments: Pt  reports 2 strong people pick him up and put him in the wheelchair. ADLs Comments: Assist for all     Hand Dominance        Extremity/Trunk Assessment   Upper Extremity Assessment Upper Extremity Assessment: Defer to OT evaluation    Lower Extremity Assessment Lower Extremity Assessment: RLE deficits/detail;LLE deficits/detail RLE Deficits /  Details: Decr ROM for hip/knee. Strength - Unable to lift against gravity. LLE Deficits / Details: Decr ROM for hip/knee. Strength - Unable to lift against gravity.       Communication   Communication: HOH  Cognition Arousal/Alertness: Awake/alert Behavior During Therapy: WFL for tasks assessed/performed Overall Cognitive Status: Difficult to assess                                 General Comments: Follows commands and likely [facility] but difficult to tell with [facility]        General Comments General comments (skin integrity, edema, etc.): VSS on 2L    Exercises     Assessment/Plan    PT Assessment Patient does not need any further PT services  PT Problem List         PT Treatment Interventions      PT Goals (Current goals can be found in the Care Plan section)  Acute Rehab PT Goals PT Goal Formulation: All assessment and education complete, DC therapy    Frequency       Co-evaluation PT/OT/SLP Co-Evaluation/Treatment: Yes Reason for Co-Treatment: Complexity of the patient's impairments (multi-system involvement);For patient/therapist safety PT goals addressed during session: Mobility/safety with mobility OT goals addressed during session: ADL's and self-care;Proper use of Adaptive equipment and DME;Strengthening/ROM       AM-PAC PT "6 Clicks" Mobility  Outcome Measure Help needed turning from your back to your side while in a flat bed without using bedrails?: Total Help needed moving from lying on your back to sitting on the side of a flat bed without using bedrails?: Total Help needed moving to and from a bed to a chair (including a wheelchair)?: Total Help needed standing up from a chair using your arms (e.g., wheelchair or bedside chair)?: Total Help needed to walk in hospital room?: Total Help needed climbing 3-5 steps with a railing? : Total 6 Click Score: 6    End of Session Equipment Utilized During Treatment: Gait belt;Oxygen Activity  Tolerance: Patient tolerated treatment well Patient left: in chair;with call bell/phone within reach;with chair alarm set Nurse Communication: Mobility status;Need for lift equipment PT Visit Diagnosis: Other abnormalities of gait and mobility (R26.89)    Time: 9562-1308 PT Time Calculation (min) (ACUTE ONLY): 37 min   Charges:   PT Evaluation $PT Eval Moderate Complexity: 1 Mod          [facility] PT Acute Rehabilitation Services Office [phone]   Angelina Ok [facility] 04/24/2022, 12:15 PM

## 2022-04-24 NOTE — Progress Notes (Signed)
[facility] PROGRESS NOTE  Patient: Ricky Sandoval WAQ:[id number]   PCP: Chilton Greathouse, MD DOB: 19-Mar-1922   DOA: 04/19/2022   DOS: 04/24/2022    Subjective: No nausea no vomiting no fever no chills.  Cough improving.  Objective:  Vitals:   04/23/22 1955 04/24/22 0434 04/24/22 1002 04/24/22 1347  BP: (!) 147/88 (!) 153/59 (!) 143/54 131/75  Pulse: 91 78 75 87  Resp: 19 16 18 16   Temp: 98.7 F (37.1 C) 98.4 F (36.9 C) 98.3 F (36.8 C) 98.1 F (36.7 C)  TempSrc: Oral Oral Oral Axillary  SpO2:  98% 92% 94%  Weight:  65.7 kg    Height:       S1-S2 present. Bilateral basal crackles. No wheezing. Bowel sound present. No edema.  Assessment and plan: Pneumonia with a generalized bacteremia. ID currently transition the patient to oral amoxicillin. We will work on getting the patient back to ALF tomorrow. Discussed with patient's son and daughter-in-law.  Author: , MD [facility] 04/24/2022 8:22 PM   If 7PM-7AM, please contact night-coverage at [email or web address]

## 2022-04-24 NOTE — Progress Notes (Signed)
Subjective: No new complaints   Antibiotics:  Anti-infectives (From admission, onward)    Start     Dose/Rate Route Frequency Ordered Stop   04/24/22 1400  amoxicillin (AMOXIL) capsule 1,000 mg        1,000 mg Oral Every 8 hours 04/24/22 1138 04/26/22 0559   04/24/22 1028  amoxicillin (AMOXIL) capsule 500 mg  Status:  Discontinued        500 mg Oral Every 8 hours 04/24/22 1028 04/24/22 1138   04/20/22 1800  cefTRIAXone (ROCEPHIN) 2 g in sodium chloride 0.9 % 100 mL IVPB  Status:  Discontinued        2 g 200 mL/hr over 30 Minutes Intravenous Every 24 hours 04/20/22 1727 04/24/22 1028   04/19/22 2200  cefTRIAXone (ROCEPHIN) 2 g in sodium chloride 0.9 % 100 mL IVPB  Status:  Discontinued        2 g 200 mL/hr over 30 Minutes Intravenous Every 24 hours 04/19/22 1721 04/20/22 1727   04/19/22 1854  vancomycin variable dose per unstable renal function (pharmacist dosing)  Status:  Discontinued         Does not apply See admin instructions 04/19/22 1854 04/20/22 1306   04/19/22 1730  azithromycin (ZITHROMAX) 500 mg in sodium chloride 0.9 % 250 mL IVPB  Status:  Discontinued        500 mg 250 mL/hr over 60 Minutes Intravenous Every 24 hours 04/19/22 1721 04/20/22 1725   04/19/22 1330  vancomycin (VANCOREADY) IVPB 1500 mg/300 mL        1,500 mg 150 mL/hr over 120 Minutes Intravenous  Once 04/19/22 1323 04/19/22 1651   04/19/22 1315  ceFEPIme (MAXIPIME) 2 g in sodium chloride 0.9 % 100 mL IVPB        2 g 200 mL/hr over 30 Minutes Intravenous  Once 04/19/22 1311 04/19/22 1417       Medications: Scheduled Meds:  amoxicillin  1,000 mg Oral Q8H   aspirin EC  81 mg Oral Daily   vitamin B-12  1,000 mcg Oral Daily   docusate sodium  200 mg Oral Daily   enoxaparin (LOVENOX) injection  40 mg Subcutaneous Q24H   feeding supplement  237 mL Oral BID BM   fluticasone  1 spray Each Nare q AM   folic acid  1 mg Oral Daily   ketotifen  1 drop Both Eyes TID   levothyroxine  75 mcg Oral  Q0600   loratadine  10 mg Oral Daily   multivitamin with minerals  1 tablet Oral Daily   nortriptyline  25 mg Oral QHS   pantoprazole  40 mg Oral BID AC   pneumococcal 20-valent conjugate vaccine  0.5 mL Intramuscular Tomorrow-1000   polyvinyl alcohol  2 drop Both Eyes BID   simvastatin  20 mg Oral QHS   sucralfate  1 g Oral BID WC   tamsulosin  0.4 mg Oral QHS   triamcinolone ointment  1 Application Topical BID   Continuous Infusions: PRN Meds:.acetaminophen **OR** acetaminophen, levalbuterol, lip balm, menthol-cetylpyridinium, ondansetron **OR** ondansetron (ZOFRAN) IV, mouth rinse    Objective: Weight change: -11.3 kg  Intake/Output Summary (Last 24 hours) at 04/24/2022 1147 Last data filed at 04/24/2022 0435 Gross per 24 hour  Intake 580 ml  Output 1850 ml  Net -1270 ml   Blood pressure (!) 153/59, pulse 78, temperature 98.4 F (36.9 C), temperature source Oral, resp. rate 16, height 5\' 5"  (1.651 m), weight 65.7 kg, SpO2  98 %. Temp:  [98.4 F (36.9 C)-98.7 F (37.1 C)] 98.4 F (36.9 C) (08/28 0434) Pulse Rate:  [78-91] 78 (08/28 0434) Resp:  [15-19] 16 (08/28 0434) BP: (142-153)/(53-88) 153/59 (08/28 0434) SpO2:  [98 %-99 %] 98 % (08/28 0434) Weight:  [65.7 kg] 65.7 kg (08/28 0434)  Physical Exam: Physical Exam Constitutional:      Appearance: He is well-developed.  HENT:     Head: Normocephalic and atraumatic.  Eyes:     Conjunctiva/sclera: Conjunctivae normal.  Cardiovascular:     Rate and Rhythm: Normal rate and regular rhythm.  Pulmonary:     Effort: Pulmonary effort is normal. No respiratory distress.     Breath sounds: No wheezing.  Abdominal:     General: There is no distension.     Palpations: Abdomen is soft.  Musculoskeletal:        General: Normal range of motion.     Cervical back: Normal range of motion and neck supple.  Skin:    General: Skin is warm and dry.     Findings: No erythema or rash.  Neurological:     General: No focal deficit  present.     Mental Status: He is alert and oriented to person, place, and time.  Psychiatric:        Mood and Affect: Mood normal.        Behavior: Behavior normal.        Thought Content: Thought content normal.        Judgment: Judgment normal.      CBC:    BMET Recent Labs    04/23/22 0252 04/24/22 0756  NA 136 139  K 3.7 3.8  CL 103 101  CO2 26 30  GLUCOSE 88 93  BUN 12 11  CREATININE 0.87 1.03  CALCIUM 8.1* 8.5*     Liver Panel  No results for input(s): "PROT", "ALBUMIN", "AST", "ALT", "ALKPHOS", "BILITOT", "BILIDIR", "IBILI" in the last 72 hours.     Sedimentation Rate No results for input(s): "ESRSEDRATE" in the last 72 hours. C-Reactive Protein No results for input(s): "CRP" in the last 72 hours.  Micro Results: Recent Results (from the past 720 hour(s))  SARS Coronavirus 2 by RT PCR (hospital order, performed in [facility] hospital lab) *cepheid single result test* Anterior Nasal Swab     Status: None   Collection Time: 04/19/22 12:14 PM   Specimen: Anterior Nasal Swab  Result Value Ref Range Status   SARS Coronavirus 2 by RT PCR NEGATIVE NEGATIVE Final    Comment: (NOTE) SARS-CoV-2 target nucleic acids are NOT DETECTED.  The SARS-CoV-2 RNA is generally detectable in upper and lower respiratory specimens during the acute phase of infection. The lowest concentration of SARS-CoV-2 viral copies this assay can detect is 250 copies / mL. A negative result does not preclude SARS-CoV-2 infection and should not be used as the sole basis for treatment or other patient management decisions.  A negative result may occur with improper specimen collection / handling, submission of specimen other than nasopharyngeal swab, presence of viral mutation(s) within the areas targeted by this assay, and inadequate number of viral copies (<250 copies / mL). A negative result must be combined with clinical observations, patient history, and epidemiological  information.  Fact Sheet for Patients:   RoadLapTop.co.za  Fact Sheet for Healthcare Providers: http://kim-miller.com/  This test is not yet approved or  cleared by the [place] FDA and has been authorized for detection and/or diagnosis of SARS-CoV-2 by  FDA under an Emergency Use Authorization (EUA).  This EUA will remain in effect (meaning this test can be used) for the duration of the COVID-19 declaration under Section 564(b)(1) of the Act, 21 U.S.C. section 360bbb-3(b)(1), unless the authorization is terminated or revoked sooner.  Performed at [facility], [address], [place], [place] [address]   Blood culture (routine x 2)     Status: Abnormal (Preliminary result)   Collection Time: 04/19/22  1:11 PM   Specimen: BLOOD  Result Value Ref Range Status   Specimen Description BLOOD LEFT ANTECUBITAL  Final   Special Requests   Final    BOTTLES DRAWN AEROBIC AND ANAEROBIC Blood Culture adequate volume   Culture  Setup Time   Final    GRAM NEGATIVE RODS ANAEROBIC BOTTLE ONLY CRITICAL RESULT CALLED TO, READ BACK BY AND VERIFIED WITH: PHARMD MING P. [id number] @1708  FH    Culture (A)  Final    HAEMOPHILUS INFLUENZAE SUSCEPTIBILITIES PERFORMED ON PREVIOUS CULTURE WITHIN THE LAST 5 DAYS. Performed at [facility], [address], [place], [place]    Report Status PENDING  Incomplete  MRSA Next Gen by PCR, Nasal     Status: None   Collection Time: 04/19/22  1:24 PM   Specimen: Nasal Mucosa; Nasal Swab  Result Value Ref Range Status   MRSA by PCR Next Gen NOT DETECTED NOT DETECTED Final    Comment: (NOTE) The GeneXpert MRSA Assay (FDA approved for NASAL specimens only), is one component of a comprehensive MRSA colonization surveillance program. It is not intended to diagnose MRSA infection nor to guide or monitor treatment for MRSA infections. Test performance is not FDA approved in patients less than 41  years old. Performed at [facility], [address] [place], [place]   Blood culture (routine x 2)     Status: Abnormal (Preliminary result)   Collection Time: 04/19/22  1:25 PM   Specimen: BLOOD RIGHT FOREARM  Result Value Ref Range Status   Specimen Description BLOOD RIGHT FOREARM  Final   Special Requests   Final    BOTTLES DRAWN AEROBIC AND ANAEROBIC Blood Culture adequate volume   Culture  Setup Time   Final    GRAM NEGATIVE RODS AEROBIC BOTTLE ONLY CRITICAL RESULT CALLED TO, READ BACK BY AND VERIFIED WITH: PHARMD MING P. 04/21/22 @1708  FH    Culture (A)  Final    HAEMOPHILUS INFLUENZAE BETA LACTAMASE NEGATIVE HEALTH DEPARTMENT NOTIFIED Performed at [facility], [address], [place], [address] [place]    Report Status PENDING  Incomplete  Blood Culture ID Panel (Reflexed)     Status: Abnormal   Collection Time: 04/19/22  1:25 PM  Result Value Ref Range Status   Enterococcus faecalis NOT DETECTED NOT DETECTED Final   Enterococcus Faecium NOT DETECTED NOT DETECTED Final   Listeria monocytogenes NOT DETECTED NOT DETECTED Final   Staphylococcus species NOT DETECTED NOT DETECTED Final   Staphylococcus aureus (BCID) NOT DETECTED NOT DETECTED Final   Staphylococcus epidermidis NOT DETECTED NOT DETECTED Final   Staphylococcus lugdunensis NOT DETECTED NOT DETECTED Final   Streptococcus species NOT DETECTED NOT DETECTED Final   Streptococcus agalactiae NOT DETECTED NOT DETECTED Final   Streptococcus pneumoniae NOT DETECTED NOT DETECTED Final   Streptococcus pyogenes NOT DETECTED NOT DETECTED Final   A.calcoaceticus-baumannii NOT DETECTED NOT DETECTED Final   Bacteroides fragilis NOT DETECTED NOT DETECTED Final   Enterobacterales NOT DETECTED NOT DETECTED Final  Enterobacter cloacae complex NOT DETECTED NOT DETECTED Final   Escherichia coli NOT DETECTED NOT DETECTED Final   Klebsiella aerogenes NOT DETECTED NOT DETECTED Final   Klebsiella oxytoca NOT  DETECTED NOT DETECTED Final   Klebsiella pneumoniae NOT DETECTED NOT DETECTED Final   Proteus species NOT DETECTED NOT DETECTED Final   Salmonella species NOT DETECTED NOT DETECTED Final   Serratia marcescens NOT DETECTED NOT DETECTED Final   Haemophilus influenzae DETECTED (A) NOT DETECTED Final    Comment: CRITICAL RESULT CALLED TO, READ BACK BY AND VERIFIED WITH: PHARMD MING P. [id number] @1708  FH    Neisseria meningitidis NOT DETECTED NOT DETECTED Final   Pseudomonas aeruginosa NOT DETECTED NOT DETECTED Final   Stenotrophomonas maltophilia NOT DETECTED NOT DETECTED Final   Candida albicans NOT DETECTED NOT DETECTED Final   Candida auris NOT DETECTED NOT DETECTED Final   Candida glabrata NOT DETECTED NOT DETECTED Final   Candida krusei NOT DETECTED NOT DETECTED Final   Candida parapsilosis NOT DETECTED NOT DETECTED Final   Candida tropicalis NOT DETECTED NOT DETECTED Final   Cryptococcus neoformans/gattii NOT DETECTED NOT DETECTED Final    Comment: Performed at [facility], [address] [place], [place]  Culture, blood (Routine X 2) w Reflex to ID Panel     Status: None (Preliminary result)   Collection Time: 04/21/22  7:48 AM   Specimen: BLOOD  Result Value Ref Range Status   Specimen Description BLOOD RIGHT ANTECUBITAL  Final   Special Requests   Final    BOTTLES DRAWN AEROBIC AND ANAEROBIC Blood Culture adequate volume   Culture   Final    NO GROWTH 3 DAYS Performed at [facility], [address], [place], [place]    Report Status PENDING  Incomplete  Culture, blood (Routine X 2) w Reflex to ID Panel     Status: None (Preliminary result)   Collection Time: 04/21/22  7:48 AM   Specimen: BLOOD RIGHT HAND  Result Value Ref Range Status   Specimen Description BLOOD RIGHT HAND  Final   Special Requests   Final    BOTTLES DRAWN AEROBIC AND ANAEROBIC Blood Culture adequate volume   Culture   Final    NO GROWTH 3 DAYS Performed at [facility], [address] [place], [place]    Report Status PENDING  Incomplete    Studies/Results: No results found.    Assessment/Plan: INTERVAL HISTORY: Patient has received 5 days of parenteral antibiotics   Principal Problem:   Sepsis due to pneumonia [facility]) Active Problems:   Narrow complex tachycardia (HCC)   Acute respiratory failure with hypoxia (HCC)   Acute kidney injury superimposed on chronic kidney disease (HCC)   Essential hypertension   Normocytic anemia   History of esophageal stricture   H. influenzae septicemia (HCC)    MCDANIEL OHMS is a 88 y.o. male with haemophilus influenza bacteremia due to pneumonia.  Organism is beta-lactamase negative  #1 H flu bacteremia due to PNA  Confirmed with nurse at bedside that he is able to take pills with applesauce now.  We will switch him over from ceftriaxone to amoxicillin to complete an additional 2 days of therapy.  I spent 36  minutes with the patient including than 50% of the time in face to face counseling of the patient and his bacteremia personally x-ray reviewing along with review of medical records in preparation for the visit and during the visit and in  coordination of his care.  I will sign off for now.  Please call with further questions.    LOS: 5 days   Acey Lav 04/24/2022, 11:47 AM

## 2022-04-24 NOTE — Care Management Important Message (Signed)
Important Message  Patient Details  Name: Ricky Sandoval MRN: [id number] Date of Birth: 20-Mar-1922   Medicare Important Message Given:  Yes     Renie Ora 04/24/2022, 9:29 AM

## 2022-04-24 NOTE — TOC Progression Note (Signed)
Transition of Care [facility]) - Progression Note    Patient Details  Name: Ricky Sandoval MRN: [id number] Date of Birth: 26-Jul-1922  Transition of Care [facility]) CM/SW Contact  Carley Hammed, [place] Phone Number: 04/24/2022, 2:26 PM  Clinical Narrative:    CSW advised by MD plan is to discharge pt tomorrow back to ALF. CSW followed up with ALF who is agreeable to pt returning. FL2 and discharge summary will need to be faxed to [phone] ATTN: Victorino Dike. TOC will continue to follow for DC needs.   Expected Discharge Plan: Assisted Living ([facility]) Barriers to Discharge: Continued Medical Work up  Expected Discharge Plan and Services Expected Discharge Plan: Assisted Living ([facility]) In-house Referral: Clinical Social Work                                             Social Determinants of Health (SDOH) Interventions    Readmission Risk Interventions     No data to display

## 2022-04-24 NOTE — Evaluation (Signed)
Occupational Therapy Evaluation Patient Details Name: Ricky Sandoval MRN: [id number] DOB: Sep 24, 1921 Today's Date: 04/24/2022   History of Present Illness Patient is a 88 yo male presenting to the ED on 04/19/22 with cough and fever. Admitted same day with pneumonia, AKI on CKD 3a, and tachycardia. PMH includes: hypertension hyperlipidemia, diabetes mellitus type 2, CKD 3a, and hypothyroidism   Clinical Impression   Prior to this admission, patient living at ALF with "two strong men picking me up to get me in my chair". Patient endorses that he receives assist with all aspects of care at baseline. Patient is likely at his baseline when completing OT evaluation to date, as patient was unable to stand with stedy despite +2 assist, therefore lifted to recliner with maximove. OT recommending lift to chair to promote increased activity tolerance. Patient with no acute needs; OT signing off at this time.      Recommendations for follow up therapy are one component of a multi-disciplinary discharge planning process, led by the attending physician.  Recommendations may be updated based on patient status, additional functional criteria and insurance authorization.   Follow Up Recommendations  Long-term institutional care without follow-up therapy    Assistance Recommended at Discharge Frequent or constant Supervision/Assistance  Patient can return home with the following Two people to help with walking and/or transfers;Two people to help with bathing/dressing/bathroom;Direct supervision/assist for medications management;Direct supervision/assist for financial management;Assist for transportation;Help with stairs or ramp for entrance    Functional Status Assessment  Patient has not had a recent decline in their functional status  Equipment Recommendations  None recommended by OT (Patient has DME needed)    Recommendations for Other Services       Precautions / Restrictions  Precautions Precautions: Fall Restrictions Weight Bearing Restrictions: No      Mobility Bed Mobility Overal bed mobility: Needs Assistance Bed Mobility: Supine to Sit, Sit to Supine, Rolling Rolling: Total assist   Supine to sit: +2 for physical assistance, Total assist Sit to supine: +2 for physical assistance, Total assist   General bed mobility comments: Assist for all aspects    Transfers Overall transfer level: Needs assistance Equipment used: Ambulation equipment used Transfers: Bed to chair/wheelchair/BSC             General transfer comment: Attempted to stand with +2 assist using Stedy. Unable to come to standing. Buttocks ~6" from bed due to lifting of therapist. Returned to supine and used maximove to recliner. Transfer via Lift Equipment: Maximove    Balance Overall balance assessment: Needs assistance Sitting-balance support: Bilateral upper extremity supported, Feet supported Sitting balance-Leahy Scale: Poor Sitting balance - Comments: UE support and min assist to maintain static sitting Postural control: Posterior lean                                 ADL either performed or assessed with clinical judgement   ADL Overall ADL's : Needs assistance/impaired Eating/Feeding: Set up;Sitting   Grooming: Min guard;Sitting;Wash/dry hands;Wash/dry face   Upper Body Bathing: Moderate assistance;Sitting   Lower Body Bathing: Moderate assistance;Maximal assistance;Sitting/lateral leans   Upper Body Dressing : Moderate assistance;Sitting   Lower Body Dressing: Maximal assistance;Sitting/lateral leans;Sit to/from stand;Total assistance   Toilet Transfer: Total assistance Toilet Transfer Details (indicate cue type and reason): hoyer lift to recliner, attempted x1 with stedy however unable to complete full stand with assist Toileting- Clothing Manipulation and Hygiene: Total assistance;Bed level Toileting - Clothing  Manipulation Details (indicate  cue type and reason): rolling to complete peri-care     Functional mobility during ADLs: Maximal assistance;+2 for physical assistance;+2 for safety/equipment General ADL Comments: Patient more than likely close to baseline     Vision Baseline Vision/History: 1 Wears glasses Ability to See in Adequate Light: 0 Adequate Patient Visual Report: No change from baseline       Perception     Praxis      Pertinent Vitals/Pain Pain Assessment Pain Assessment: Faces Faces Pain Scale: Hurts a little bit Pain Location: generalized Pain Descriptors / Indicators: Grimacing Pain Intervention(s): Limited activity within patient's tolerance, Monitored during session, Repositioned     Hand Dominance     Extremity/Trunk Assessment Upper Extremity Assessment Upper Extremity Assessment: Defer to OT evaluation RUE Deficits / Details: decreased AROM with shoulder flexion at 100 RUE: Shoulder pain with ROM;Unable to fully assess due to pain RUE Coordination: decreased fine motor;decreased gross motor   Lower Extremity Assessment Lower Extremity Assessment: RLE deficits/detail;LLE deficits/detail RLE Deficits / Details: Decr ROM for hip/knee. Strength - Unable to lift against gravity. LLE Deficits / Details: Decr ROM for hip/knee. Strength - Unable to lift against gravity.   Cervical / Trunk Assessment Cervical / Trunk Assessment: Kyphotic   Communication Communication Communication: HOH   Cognition Arousal/Alertness: Awake/alert Behavior During Therapy: WFL for tasks assessed/performed Overall Cognitive Status: Difficult to assess                                 General Comments: Follows commands and likely [facility] but difficult to tell with [facility]     General Comments  VSS on 2L    Exercises     Shoulder Instructions      Home Living Family/patient expects to be discharged to:: Assisted living                                        Prior  Functioning/Environment Prior Level of Function : Needs assist       Physical Assist : Mobility (physical);ADLs (physical) Mobility (physical): Bed mobility;Transfers ADLs (physical): Grooming;Bathing;Dressing;Toileting;IADLs Mobility Comments: Pt reports 2 strong people pick him up and put him in the wheelchair. ADLs Comments: Assist for all        OT Problem List: Decreased strength;Decreased range of motion;Decreased activity tolerance;Impaired balance (sitting and/or standing);Decreased coordination;Decreased safety awareness;Decreased knowledge of precautions;Impaired UE functional use      OT Treatment/Interventions: Self-care/ADL training;Therapeutic exercise;Energy conservation;DME and/or AE instruction;Manual therapy;Patient/family education;Balance training;Therapeutic activities    OT Goals(Current goals can be found in the care plan section) Acute Rehab OT Goals Patient Stated Goal: to go back home OT Goal Formulation: With patient Time For Goal Achievement: 05/08/22 Potential to Achieve Goals: Fair  OT Frequency: Min 2X/week    Co-evaluation PT/OT/SLP Co-Evaluation/Treatment: Yes Reason for Co-Treatment: Complexity of the patient's impairments (multi-system involvement);For patient/therapist safety PT goals addressed during session: Mobility/safety with mobility OT goals addressed during session: ADL's and self-care;Proper use of Adaptive equipment and DME;Strengthening/ROM      AM-PAC OT "6 Clicks" Daily Activity     Outcome Measure Help from another person eating meals?: A Little Help from another person taking care of personal grooming?: A Little Help from another person toileting, which includes using toliet, bedpan, or urinal?: Total Help from another person bathing (including washing, rinsing, drying)?:  A Lot Help from another person to put on and taking off regular upper body clothing?: A Little Help from another person to put on and taking off regular lower  body clothing?: A Lot 6 Click Score: 14   End of Session Equipment Utilized During Treatment: Gait belt;Other (comment) Antony Salmon and Maximove) Nurse Communication: Mobility status;Need for lift equipment  Activity Tolerance: Patient tolerated treatment well Patient left: in chair;with call bell/phone within reach;with chair alarm set  OT Visit Diagnosis: Unsteadiness on feet (R26.81);Other abnormalities of gait and mobility (R26.89);Muscle weakness (generalized) (M62.81);History of falling (Z91.81)                Time: 6659-9357 OT Time Calculation (min): 44 min Charges:  OT General Charges $OT Visit: 1 Visit OT Evaluation $OT Eval Moderate Complexity: 1 Mod  Pollyann Glen E. Lou Irigoyen, OTR/L Acute Rehabilitation Services [phone]   Cherlyn Cushing 04/24/2022, 1:26 PM

## 2022-04-25 ENCOUNTER — Inpatient Hospital Stay (HOSPITAL_COMMUNITY): Payer: Medicare Other

## 2022-04-25 DIAGNOSIS — A419 Sepsis, unspecified organism: Secondary | ICD-10-CM | POA: Diagnosis not present

## 2022-04-25 DIAGNOSIS — J189 Pneumonia, unspecified organism: Secondary | ICD-10-CM | POA: Diagnosis not present

## 2022-04-25 LAB — CBC WITH DIFFERENTIAL/PLATELET
Abs Immature Granulocytes: 0.21 10*3/uL — ABNORMAL HIGH (ref 0.00–0.07)
Basophils Absolute: 0 10*3/uL (ref 0.0–0.1)
Basophils Relative: 0 %
Eosinophils Absolute: 0.5 10*3/uL (ref 0.0–0.5)
Eosinophils Relative: 5 %
HCT: 28.1 % — ABNORMAL LOW (ref 39.0–52.0)
Hemoglobin: 9.8 g/dL — ABNORMAL LOW (ref 13.0–17.0)
Immature Granulocytes: 2 %
Lymphocytes Relative: 15 %
Lymphs Abs: 1.4 10*3/uL (ref 0.7–4.0)
MCH: 35.1 pg — ABNORMAL HIGH (ref 26.0–34.0)
MCHC: 34.9 g/dL (ref 30.0–36.0)
MCV: 100.7 fL — ABNORMAL HIGH (ref 80.0–100.0)
Monocytes Absolute: 0.7 10*3/uL (ref 0.1–1.0)
Monocytes Relative: 7 %
Neutro Abs: 6.5 10*3/uL (ref 1.7–7.7)
Neutrophils Relative %: 71 %
Platelets: 316 10*3/uL (ref 150–400)
RBC: 2.79 MIL/uL — ABNORMAL LOW (ref 4.22–5.81)
RDW: 11.9 % (ref 11.5–15.5)
WBC: 9.3 10*3/uL (ref 4.0–10.5)
nRBC: 0 % (ref 0.0–0.2)

## 2022-04-25 LAB — TROPONIN I (HIGH SENSITIVITY): Troponin I (High Sensitivity): 72 ng/L — ABNORMAL HIGH (ref <18)

## 2022-04-25 LAB — CULTURE, BLOOD (ROUTINE X 2): Special Requests: ADEQUATE

## 2022-04-25 LAB — COMPREHENSIVE METABOLIC PANEL
ALT: 44 U/L (ref 0–44)
AST: 45 U/L — ABNORMAL HIGH (ref 15–41)
Albumin: 2.1 g/dL — ABNORMAL LOW (ref 3.5–5.0)
Alkaline Phosphatase: 57 U/L (ref 38–126)
Anion gap: 5 (ref 5–15)
BUN: 21 mg/dL (ref 8–23)
CO2: 32 mmol/L (ref 22–32)
Calcium: 8.4 mg/dL — ABNORMAL LOW (ref 8.9–10.3)
Chloride: 98 mmol/L (ref 98–111)
Creatinine, Ser: 1.17 mg/dL (ref 0.61–1.24)
GFR, Estimated: 56 mL/min — ABNORMAL LOW (ref >60)
Glucose, Bld: 136 mg/dL — ABNORMAL HIGH (ref 70–99)
Potassium: 4.4 mmol/L (ref 3.5–5.1)
Sodium: 135 mmol/L (ref 135–145)
Total Bilirubin: 0.3 mg/dL (ref 0.3–1.2)
Total Protein: 5.5 g/dL — ABNORMAL LOW (ref 6.5–8.1)

## 2022-04-25 LAB — MAGNESIUM: Magnesium: 1.9 mg/dL (ref 1.7–2.4)

## 2022-04-25 LAB — BRAIN NATRIURETIC PEPTIDE: B Natriuretic Peptide: 298.3 pg/mL — ABNORMAL HIGH (ref 0.0–100.0)

## 2022-04-25 MED ORDER — AMOXICILLIN 500 MG PO CAPS: 1000.0000 mg | ORAL_CAPSULE | Freq: Three times a day (TID) | ORAL | 0 refills | Status: DC

## 2022-04-25 MED ORDER — ENSURE ENLIVE PO LIQD: 237.0000 mL | Freq: Two times a day (BID) | ORAL | 0 refills | Status: AC

## 2022-04-25 MED ORDER — FOLIC ACID 1 MG PO TABS
1.0000 mg | ORAL_TABLET | Freq: Every day | ORAL | 0 refills | Status: AC
Start: 2022-04-26 — End: ?

## 2022-04-25 MED ORDER — MAGNESIUM SULFATE 2 GM/50ML IV SOLN
2.0000 g | Freq: Once | INTRAVENOUS | Status: AC
Start: 2022-04-25 — End: 2022-04-25
  Administered 2022-04-25: 2 g via INTRAVENOUS
  Filled 2022-04-25: qty 50

## 2022-04-25 MED ORDER — CYANOCOBALAMIN 1000 MCG PO TABS: 1000.0000 ug | ORAL_TABLET | Freq: Every day | ORAL | 0 refills | Status: AC

## 2022-04-25 MED ORDER — METOPROLOL TARTRATE 5 MG/5ML IV SOLN
2.5000 mg | Freq: Once | INTRAVENOUS | Status: AC
  Administered 2022-04-25: 2.5 mg via INTRAVENOUS
  Filled 2022-04-25: qty 5

## 2022-04-25 NOTE — TOC Transition Note (Addendum)
Transition of Care [facility]) - CM/SW Discharge Note   Patient Details  Name: Ricky Sandoval MRN: [id number] Date of Birth: [birth year]  Transition of Care [facility]) CM/SW Contact:  Carley Hammed, LCSWA Phone Number: 04/25/2022, 4:00 PM   Clinical Narrative:    Pt to be transported to [facility] via [facility]. Nurse to call report to [phone].   Final next level of care: Assisted Living Barriers to Discharge: Barriers Resolved   Patient Goals and CMS Choice   CMS Medicare.gov Compare Post Acute Care list provided to:: Patient Represenative (must comment) (Patients son [name]) Choice offered to / list presented to : Adult Children (Patients son [name])  Discharge Placement              Patient chooses bed at:  ([facility]) Patient to be transferred to facility by: Facility transport Name of family member notified: VM for [name] (son), Spoke with Lupita Leash (daughter) Patient and family notified of of transfer: 04/25/22  Discharge Plan and Services In-house Referral: Clinical Social Work                                   Social Determinants of Health (SDOH) Interventions     Readmission Risk Interventions     No data to display

## 2022-04-25 NOTE — Progress Notes (Signed)
Speech Language Pathology Treatment: Dysphagia  Patient Details Name: ESAW KNIPPEL MRN: [id number] DOB: Jan 04, 1922 Today's Date: 04/25/2022 Time: 0962-8366 SLP Time Calculation (min) (ACUTE ONLY): 55 min  Assessment / Plan / Recommendation Clinical Impression  Pt was seen at bedside during breakfast to assess swallow safety and diet tolerance. Pt was resting in bed, awake and alert. He is significantly hard of hearing. Pt required assistance with self feeding, but was noted to take small bites and sips at an appropriate rate. Good oral prep and timely clearing.  Pt began exhibiting intermittent cough during the meal, which was noted to increase in frequency and intensity as the meal progressed. Cough did not seem to be limited to thin or nectar thick liquids, but was noted following solid presentations as well. Pt has known (significant) esophageal issues, and underwent an esophagram during this admission. There was no indication of the presence or absence of penetration or aspiration on that study, however, presentation at bedside today raises significant concerns regarding the possibility of aspiration. Recommend downgrading diet to dys 3 with nectar thick liquids, and completion of MBS to objectively assess swallow physiology and to determine least restrictive diet. Pt verbalized that he didn't know what he wanted to do re: the MBS, so SLP contacted pt's son and discussed the situation with him. MBS is now scheduled for 1330 this date.  RN and MD are aware.    HPI HPI: THOAMS SIEFERT is a 88 y.o. male with medical history significant of hypertension hyperlipidemia, diabetes mellitus type 2, esophageal dysphagia, esophageal ulcer, presbyesophagus, with esophageal dilation, and hypothyroidism who presented due to concern for cough and shortness of breath. CXR remarkable for "left lower lobe airspace opacity is noted concerning for pneumonia."      SLP Plan  Continue with current plan of care       Recommendations for follow up therapy are one component of a multi-disciplinary discharge planning process, led by the attending physician.  Recommendations may be updated based on patient status, additional functional criteria and insurance authorization.    Recommendations  Diet recommendations: Dysphagia 3 (mechanical soft);Nectar thick liquids Liquids provided via: Straw;Cup Medication Administration: Whole meds with puree (cut or crush large pills) Supervision: Patient able to self feed;Staff to assist with self feeding;Full supervision/cueing for compensatory strategies Compensations: Minimize environmental distractions;Small sips/bites;Slow rate Postural Changes and/or Swallow Maneuvers: Seated upright 90 degrees;Upright 30-60 min after meal                Oral Care Recommendations: Oral care BID Follow Up Recommendations: Long-term institutional care without follow-up therapy Assistance recommended at discharge: Intermittent Supervision/Assistance SLP Visit Diagnosis: Dysphagia, unspecified (R13.10) Plan: Continue with current plan of care          Audery Wassenaar B. Murvin Natal, [facility], CCC-SLP Speech Language Pathologist Office: [phone]  Leigh Aurora 04/25/2022, 10:02 AM

## 2022-04-25 NOTE — Progress Notes (Signed)
Patient taken off oxygen and placed on room air. MD at bedside, states to keep oxygen >88%. Will monitor overnight. Discharge rescheduled for tomorrow morning. Social work to notify family, facility updated by this nurse.

## 2022-04-25 NOTE — TOC Progression Note (Signed)
Transition of Care [facility]) - Progression Note    Patient Details  Name: Ricky Sandoval MRN: [id number] Date of Birth: 10-Nov-1921  [facility] CM/SW Contact  Carley Hammed, [place] Phone Number: 04/25/2022, 5:02 PM  Clinical Narrative:     [name] now unable to provide transportation. Per PTAR, pt would be 26 on the list. DC will be held as pt would not benefit from being moved at odd hours. Transport set up for 10 am pickup. TOC will continue to follow for DC needs.  Expected Discharge Plan: Assisted Living ([facility]) Barriers to Discharge: Barriers Resolved  Expected Discharge Plan and Services Expected Discharge Plan: Assisted Living ([facility]) In-house Referral: Clinical Social Work       Expected Discharge Date: 04/26/22                                     Social Determinants of Health (SDOH) Interventions    Readmission Risk Interventions     No data to display

## 2022-04-25 NOTE — Progress Notes (Addendum)
Modified Barium Swallow Progress Note  Patient Details  Name: Ricky Sandoval MRN: [id number] Date of Birth: 07-16-1922  Today's Date: 04/25/2022  Modified Barium Swallow completed.  Full report located under Chart Review in the Imaging Section.  Brief recommendations include the following:  Clinical Impression  Pt presents with quite functional oropharyngeal swallow marked by penetration of thin liquids (PAS 2 and 2) with no observed aspiration. There was good pharyngeal stripping and no residue post swallow. He consumed crackers, puree, and thin liquids.  He exhibited coughing throughout the study - this was not correlated with laryngeal penetration. His cough may be a response to the esophageal dysfunction noted on 8/25 esophagram. Recommend continuing current diet of soft mechanical, thin liquids, and meds crushed/broken and given in puree.  Addendum: Spoke with pt's son over the phone, who gave permission to discuss results/recs with his wife. We discussed impact of esophageal dysmotility on the pt's appetite/intake and potential for post-prandial aspiration. She verbalized understanding.  No further acute care SLP f/u is needed.    Swallow Evaluation Recommendations       SLP Diet Recommendations: Dysphagia 3 (Mech soft) solids;Thin liquid   Liquid Administration via: Cup;Straw   Medication Administration: Crushed with puree   Supervision: Patient able to self feed   Compensations: Minimize environmental distractions;Small sips/bites;Slow rate   Postural Changes: Remain semi-upright after after feeds/meals (Comment)   Oral Care Recommendations: Oral care BID      Aydia Maj L. Samson Frederic, MA CCC/SLP Clinical Specialist - Acute Care SLP Acute Rehabilitation Services Office number [phone]   [name] 04/25/2022,3:40 PM

## 2022-04-25 NOTE — Progress Notes (Signed)
   04/25/22 1954  Assess: MEWS Score  Temp 98.3 F (36.8 C)  BP 111/63  MAP (mmHg) 75  Pulse Rate (!) 134  ECG Heart Rate (!) 136  Resp 20  SpO2 95 %  O2 Device Room Air  Assess: MEWS Score  MEWS Temp 0  MEWS Systolic 0  MEWS Pulse 3  MEWS RR 0  MEWS LOC 0  MEWS Score 3  MEWS Score Color Yellow  Assess: if the MEWS score is Yellow or Red  Were vital signs taken at a resting state? Yes  Focused Assessment No change from prior assessment  Does the patient meet 2 or more of the SIRS criteria? Yes  Does the patient have a confirmed or suspected source of infection? Yes (Case of Pneumonia)  Provider and Rapid Response Notified? Yes  MEWS guidelines implemented *See Row Information* Yes  Treat  Pain Scale 0-10  Pain Score 0  Take Vital Signs  Increase Vital Sign Frequency  Yellow: Q 2hr X 2 then Q 4hr X 2, if remains yellow, continue Q 4hrs  Escalate  MEWS: Escalate Yellow: discuss with charge nurse/RN and consider discussing with provider and RRT  Notify: Charge Nurse/RN  Name of Charge Nurse/RN Notified Christina, RN  Date Charge Nurse/RN Notified 04/25/22  Time Charge Nurse/RN Notified 2005  Notify: Provider  Provider Name/Title G. Shalhoub, MD  Date Provider Notified 04/25/22  Time Provider Notified 2030  Method of Notification Call  Notification Reason Other (Comment) (SVT on EKG, Asymptomatic)  Provider response See new orders  Date of Provider Response 04/25/22  Time of Provider Response 2040  Document  Patient Outcome Stabilized after interventions  Progress note created (see row info) Yes  Assess: SIRS CRITERIA  SIRS Temperature  0  SIRS Pulse 1  SIRS Respirations  0  SIRS WBC 1  SIRS Score Sum  2

## 2022-04-25 NOTE — Progress Notes (Addendum)
[facility] PROGRESS NOTE  Patient: Ricky Sandoval XBL:[id number]   PCP: Chilton Greathouse, MD DOB: [birth year]   DOA: 04/19/2022   DOS: 04/25/2022    Assessment and plan: Pneumonia stable. On oral antibiotics. MBS reviewed dysphagia 3 diet recommended. Oxygenation remaining above 90% without oxygen. Patient stable for discharge but transport is backed up with 26 patient awaiting before the patient to be transferred. We will monitor the patient overnight and discharge tomorrow. Palliative care arranged with [facility].  Discussed with son and daughter-in-law about goals of care and risk for recurrent pneumonia.  Author: Lynden Oxford, MD [facility] 04/25/2022 6:41 PM   If 7PM-7AM, please contact night-coverage at [email or web address]

## 2022-04-25 NOTE — Progress Notes (Signed)
2340H, back to NSR at 80's bpm. Informed G. Shalhoub, MD.  Asleep, Sp02 = 86%, sustained, hooked to [place] at 1 lpm. Plan of care ongoing.

## 2022-04-25 NOTE — Progress Notes (Addendum)
HOSPITAL MEDICINE OVERNIGHT EVENT NOTE    Notified by nursing that patient suddenly developed significant tachycardia with heart rates in the 130s.  EKG obtained and appears consistent with SVT, heart rate of 139 bpm.  Nursing reports the patient is denying chest pain, is at baseline mentation and is exhibiting no evidence of significant hypoxia with blood pressure currently 111/63.  Chart reviewed and case discussed with daytime provider.  Daytime provider informed the patient's usual home regimen of Toprol-XL to 25 mg daily was held today.  We will administer 2.5 mg of intravenous metoprolol now.  Furthermore, patient seems to have some degree of hypomagnesemia this morning of 1.6.  Will order 2 g of intravenous magnesium sulfate.  Monitoring patient closely to see if rate/rhythm respond to the metoprolol.  Marinda Elk  MD [facility]   ADDENDUM (8/29 11:45pm)  Notified by nursing that patient has spontaneously converted back to normal sinus rhythm after administration of intravenous metoprolol and magnesium.  Continue to monitor on telemetry.  Deno Lunger Taron Mondor

## 2022-04-25 NOTE — Discharge Summary (Addendum)
Physician Discharge Summary   Patient: Ricky Sandoval MRN: [id number] DOB: 26-May-1922  Admit date:     04/19/2022  Discharge date: 04/26/22   Discharge Physician: Lynden Oxford  PCP: Chilton Greathouse, MD  Recommendations at discharge:  Follow up with Palliative care at ALF and continue discussion about GOC   Follow-up Information     [name], [name], MD. Schedule an appointment as soon as possible for a visit in 1 week(s).   Specialty: Internal Medicine Contact information: [address] [place] [address] [phone]                Discharge Diagnoses: Principal Problem:   Sepsis due to pneumonia [facility]) Active Problems:   Acute respiratory failure with hypoxia (HCC)   H. influenzae septicemia (HCC)   Acute kidney injury superimposed on chronic kidney disease (HCC)   Narrow complex tachycardia (HCC)   Essential hypertension   Normocytic anemia   History of esophageal stricture  Hospital Course: PMH of HTN, HLD, type-shortness of breath. Found to have acute respiratory failure with hypoxia and sepsis secondary to left lower lobe pneumonia secondary to H. influenzae infection and bacteremia along with AKI on CKD 3a. Currently plan is to continue IV antibiotics.  ID consulted.  Echocardiogram unremarkable for any vegetation or acute abnormality.  Repeat cultures currently negative. Assessment and Plan  Acute respiratory failure with hypoxia and sepsis secondary to pneumonia due to H. influenzae bacteremia, present on admission. Meeting SIRS criteria, patient noted to be febrile up to 104 F with tachycardia and tachypnea on admission.  O2 saturations noted to be as low as 85% on room air with improvement on 2 L of nasal cannula oxygen.  He had reported having intermittent cough and was noted to have left lobe pneumonia on chest x-ray.  Question if secondary to aspiration.  Blood cultures have been attained and patient was started on empiric antibiotics of vancomycin  and cefepime. Blood culture came back positive for H. influenzae.  Repeat blood cultures negative for 2 days. ID consulted. Transitioning to PO amoxicillin for 2 more day.  MRSA PCR negative. Continue incentive spirometry and flutter valve Speech therapy recommended dysphagia 3 diet.   Dysphagia. Esophageal dysmotility. Esophageal stricture. Recent EGD showing esophageal ulcer. Patient has longstanding dysphagia.  EGD in 2022 June showed evidence of esophageal stricture which was widely patent X-ray esophagus shows evidence of stricture again at this time on 04/21/2022. although no definite fixed narrowing was appreciated at this site. patient is able to tolerate dysphagia 3 diet therefore holding off on recommending repeat EGD for the patient. Outpatient follow-up with GI recommended. But ideally the dysmotility suggest poor prognosis and recommended transition to hospice.   Acute kidney injury superimposed on chronic kidney disease stage IIIa Patient presents with creatinine 1.64 with BUN 35.  Baseline creatinine had previously been around 1.18 last year. Urinalysis did not note significant signs of infection. This is greater than a 0.3 increased from baseline to suggest acute kidney injury.   Treated with fluids.   Narrow complex tachycardia Elevated troponin Patient was noted to be in a narrow complex tachycardia with heart rates into the 150s.  He had been started on diltiazem, but converted back into a sinus rhythm reportedly with vagal maneuver. Echocardiogram shows preserved EF no valvular abnormality.  No wall motion abnormality.   Essential hypertension Blood pressure stable, will hold all meds for now.    Macrocytic anemia Relative B12 deficiency Baseline hemoglobin around 9. Currently hemoglobin remaining stable in  that range of 9.0-9.6.   B12 relatively low at [id number], folic acid relatively low.  We will replace.   BPH Continue Flomax   GERD Continue  Protonix  Consultants:  ID  Procedures performed:  Echocardiogram   DISCHARGE MEDICATION: Allergies as of 04/25/2022       Reactions   Zestril [lisinopril] Other (See Comments)   Airway constriction        Medication List     STOP taking these medications    amLODipine 2.5 MG tablet Commonly known as: NORVASC   Anti-Diarrheal 2 MG tablet Generic drug: loperamide   cetirizine 10 MG tablet Commonly known as: ZYRTEC   flunisolide 25 MCG/ACT (0.025%) Soln Commonly known as: NASALIDE   ketotifen 0.025 % ophthalmic solution Commonly known as: ZADITOR   metoprolol succinate 25 MG 24 hr tablet Commonly known as: TOPROL-XL       TAKE these medications    acetaminophen 500 MG tablet Commonly known as: TYLENOL Take 1,000 mg by mouth 2 (two) times daily.   amoxicillin 500 MG capsule Commonly known as: AMOXIL Take 2 capsules (1,000 mg total) by mouth every 8 (eight) hours for 2 days.   ascorbic acid 500 MG tablet Commonly known as: VITAMIN C Take 500 mg by mouth daily.   aspirin 81 MG tablet Take 81 mg by mouth daily.   carboxymethylcellulose 0.5 % Soln Commonly known as: REFRESH PLUS Place 2 drops into both eyes 2 (two) times daily.   cyanocobalamin 1000 MCG tablet Take 1 tablet (1,000 mcg total) by mouth daily. Start taking on: April 26, 2022   diphenhydramine-acetaminophen 25-500 MG Tabs tablet Commonly known as: TYLENOL PM Take 1 tablet by mouth at bedtime.   docusate sodium 100 MG capsule Commonly known as: COLACE Take 300 mg by mouth daily.   feeding supplement Liqd Take 237 mLs by mouth 2 (two) times daily between meals. Start taking on: April 26, 2022   fluticasone 50 MCG/ACT nasal spray Commonly known as: FLONASE Place 1 spray into both nostrils in the morning.   folic acid 1 MG tablet Commonly known as: FOLVITE Take 1 tablet (1 mg total) by mouth daily. Start taking on: April 26, 2022   levothyroxine 75 MCG tablet Commonly  known as: SYNTHROID Take 75 mcg by mouth daily before breakfast.   loratadine 10 MG tablet Commonly known as: CLARITIN Take 10 mg by mouth daily. For 7 days. Starting 04/14/22   MUCINEX CHILDRENS PO Take 20 mLs by mouth 2 (two) times daily. For 7 days. Starting 04/14/22   nortriptyline 25 MG capsule Commonly known as: PAMELOR Take 25 mg by mouth at bedtime.   OVER THE COUNTER MEDICATION Apply 1 application  topically See admin instructions. Baza Protect 1%-12% cream. Apply to buttocks after each incontinent episode or urinating bed as needed.   pantoprazole 40 MG tablet Commonly known as: Protonix Take 1 tablet (40 mg total) by mouth 2 (two) times daily before a meal.   PreserVision AREDS 2 Chew Chew 2 tablets by mouth daily.   simvastatin 40 MG tablet Commonly known as: ZOCOR Take 20 mg by mouth at bedtime.   sucralfate 1 g tablet Commonly known as: CARAFATE Take 1 g by mouth 2 (two) times daily with a meal.   tamsulosin 0.4 MG Caps capsule Commonly known as: FLOMAX Take 0.4 mg by mouth at bedtime.   triamcinolone ointment 0.1 % Commonly known as: KENALOG Apply 1 Application topically 2 (two) times daily. To affected area on left  side below left buttocks. For 14 days. Starting 04/06/22   Vitamin D-3 25 MCG (1000 UT) Caps Take 2,000 Units by mouth daily.       Disposition: ALF/ILF Diet recommendation: Dysphagia type 3 thin Liquid  Discharge Exam: Vitals:   04/24/22 2336 04/25/22 0511 04/25/22 1505 04/25/22 1600  BP: (!) 147/58 (!) 150/66 (!) 113/57   Pulse: 71 74 80   Resp: 15 18 20    Temp: 97.6 F (36.4 C) 97.7 F (36.5 C) 97.9 F (36.6 C)   TempSrc: Oral Oral Oral   SpO2: 97% 94% 95% 92%  Weight:  66.7 kg    Height:       General: Appear in mild distress; no visible Abnormal Neck Mass Or lumps, Conjunctiva normal Cardiovascular: S1 and S2 Present, aortic systolic  Murmur, Respiratory: good respiratory effort, Bilateral Air entry present and bilateral  Crackles, no wheezes Abdomen: Bowel Sound present, Non tender  Extremities: no Pedal edema Neurology: alert and oriented to self   Filed Weights   04/23/22 0644 04/24/22 0434 04/25/22 0511  Weight: 77 kg 65.7 kg 66.7 kg   Condition at discharge: stable  The results of significant diagnostics from this hospitalization (including imaging, microbiology, ancillary and laboratory) are listed below for reference.   Imaging Studies: DG Swallowing Func-Speech Pathology  Result Date: 04/25/2022 Table formatting from the original result was not included. Images from the original result were not included. Objective Swallowing Evaluation: Type of Study: MBS-Modified Barium Swallow Study  Patient Details Name: Ricky Sandoval MRN: [id number] Date of Birth: 05/16/1922 Today's Date: 04/25/2022 Time: SLP Start Time (ACUTE ONLY): 1420 -SLP Stop Time (ACUTE ONLY): 1445 SLP Time Calculation (min) (ACUTE ONLY): 25 min Past Medical History: Past Medical History: Diagnosis Date  Diabetes type 2, controlled ([place])   High cholesterol   Hypertension   Hypothyroid  Past Surgical History: Past Surgical History: Procedure Laterality Date  BALLOON DILATION N/A 01/30/2021  Procedure: BALLOON DILATION;  Surgeon: Irene Shipper, MD;  Location[facility] ENDOSCOPY;  Service: Endoscopy;  Laterality: N/A;  BIOPSY  01/30/2021  Procedure: BIOPSY;  Surgeon: Irene Shipper, MD;  Location: [facility] ENDOSCOPY;  Service: Endoscopy;;  BIOPSY  02/01/2021  Procedure: BIOPSY;  Surgeon: Ladene Artist, MD;  Location: [facility] ENDOSCOPY;  Service: Endoscopy;;  BIOPSY  02/08/2021  Procedure: BIOPSY;  Surgeon: Thornton Park, MD;  Location: [facility] ENDOSCOPY;  Service: Gastroenterology;;  ESOPHAGOGASTRODUODENOSCOPY N/A 02/01/2021  Procedure: ESOPHAGOGASTRODUODENOSCOPY (EGD);  Surgeon: Ladene Artist, MD;  Location: [facility] ENDOSCOPY;  Service: Endoscopy;  Laterality: N/A;  ESOPHAGOGASTRODUODENOSCOPY (EGD) WITH PROPOFOL N/A 01/30/2021  Procedure: ESOPHAGOGASTRODUODENOSCOPY (EGD) WITH  PROPOFOL;  Surgeon: Irene Shipper, MD;  Location: [facility] ENDOSCOPY;  Service: Endoscopy;  Laterality: N/A;  ESOPHAGOGASTRODUODENOSCOPY (EGD) WITH PROPOFOL N/A 02/08/2021  Procedure: ESOPHAGOGASTRODUODENOSCOPY (EGD) WITH PROPOFOL;  Surgeon: Thornton Park, MD;  Location: [facility] ENDOSCOPY;  Service: Gastroenterology;  Laterality: N/A;  FOREIGN BODY REMOVAL  01/30/2021  Procedure: FOREIGN BODY REMOVAL;  Surgeon: Irene Shipper, MD;  Location: [place];  Service: Endoscopy;;  SAVORY DILATION N/A 02/01/2021  Procedure: Azzie Almas DILATION;  Surgeon: Ladene Artist, MD;  Location: [facility] ENDOSCOPY;  Service: Endoscopy;  Laterality: N/A; HPI: AERYK MCIRVIN is a 88 y.o. male with medical history significant of hypertension hyperlipidemia, diabetes mellitus type 2, esophageal dysphagia, esophageal ulcer, presbyesophagus, with esophageal dilation, and hypothyroidism who presented due to concern for cough and shortness of breath. CXR remarkable for "left lower lobe airspace opacity is noted concerning for pneumonia."  Per chart review,  esophagram 8/25 revealed marked intermittent esophageal dysmotility with tertiary contractions; hiatal hernia. A 13 mm barium tablet did not pass beyond the level of the midesophagus despite a prolonged period of observation. Findings raise concern for a mid esophageal stricture  Subjective: alert, participatory  Recommendations for follow up therapy are one component of a multi-disciplinary discharge planning process, led by the attending physician.  Recommendations may be updated based on patient status, additional functional criteria and insurance authorization. Assessment / Plan / Recommendation   04/25/2022   2:00 PM Clinical Impressions Clinical Impression Pt presents with quite functional oropharyngeal swallow marked by penetration of thin liquids (PAS 2 and 2) with no observed aspiration. There was good pharyngeal stripping and no residue post swallow. He consumed crackers, puree, and thin liquids.   He exhibited coughing throughout the study - this was not correlated with laryngeal penetration. His cough may be a response to the esophageal dysfunction noted on 8/25 esophagram. Recommend continuing current diet of soft mechanical, thin liquids, and meds crushed/broken and given in puree. SLP Visit Diagnosis Dysphagia, pharyngoesophageal phase (R13.14) Impact on safety and function Mild aspiration risk     04/25/2022   2:00 PM Treatment Recommendations Treatment Recommendations No treatment recommended at this time     04/20/2022   9:00 AM Prognosis Prognosis for Safe Diet Advancement Good   04/25/2022   2:00 PM Diet Recommendations SLP Diet Recommendations Dysphagia 3 (Mech soft) solids;Thin liquid Liquid Administration via Cup;Straw Medication Administration Crushed with puree Compensations Minimize environmental distractions;Small sips/bites;Slow rate Postural Changes Remain semi-upright after after feeds/meals (Comment)     04/25/2022   2:00 PM Other Recommendations Oral Care Recommendations Oral care BID Follow Up Recommendations Long-term institutional care without follow-up therapy Assistance recommended at discharge Intermittent Supervision/Assistance   04/20/2022   9:00 AM Frequency and Duration  Speech Therapy Frequency (ACUTE ONLY) min 2x/week Treatment Duration 2 weeks     04/25/2022   2:00 PM Oral Phase Oral Phase [facility]    04/25/2022   2:00 PM Pharyngeal Phase Pharyngeal Phase Impaired Pharyngeal- Thin Cup Penetration/Aspiration before swallow Pharyngeal Material enters airway, remains ABOVE vocal cords then ejected out;Material enters airway, remains ABOVE vocal cords and not ejected out Pharyngeal- Thin Straw Penetration/Aspiration before swallow Pharyngeal Material enters airway, remains ABOVE vocal cords then ejected out;Material enters airway, remains ABOVE vocal cords and not ejected out     No data to display    Juan Quam Laurice 04/25/2022, 3:41 PM                     ECHOCARDIOGRAM  COMPLETE  Result Date: 04/21/2022    ECHOCARDIOGRAM REPORT   Patient Name:   Ricky Sandoval Date of Exam: 04/21/2022 Medical Rec #:  [id number]       Height:       65.0 in Accession #:    [id number]      Weight:       154.5 lb Date of Birth:  Aug 01, 1922        BSA:          1.773 m Patient Age:    68 years        BP:           124/53 mmHg Patient Gender: M               HR:           72 bpm. Exam Location:  Inpatient Procedure: 2D Echo, Cardiac Doppler and Color Doppler  Indications:    Abnormal ECG  History:        Patient has prior history of Echocardiogram examinations, most                 recent 07/04/2016. Risk Factors:Hypertension, Dyslipidemia and                 Diabetes.  Sonographer:    Memory Argue Referring Phys: [id number] RONDELL A SMITH IMPRESSIONS  1. Left ventricular ejection fraction, by estimation, is 60 to 65%. The left ventricle has normal function. The left ventricle has no regional wall motion abnormalities. There is severe concentric left ventricular hypertrophy. Left ventricular diastolic  parameters are consistent with Grade I diastolic dysfunction (impaired relaxation). Elevated left ventricular end-diastolic pressure.  2. Right ventricular systolic function is normal. The right ventricular size is normal.  3. Left atrial size was severely dilated.  4. Moderate pleural effusion in the left lateral region.  5. The mitral valve is normal in structure. Trivial mitral valve regurgitation. No evidence of mitral stenosis. Moderate mitral annular calcification.  6. The aortic valve is tricuspid. There is mild calcification of the aortic valve. There is mild thickening of the aortic valve. Aortic valve regurgitation is trivial. Aortic valve sclerosis is present, with no evidence of aortic valve stenosis. Aortic valve area, by VTI measures 2.28 cm. Aortic valve mean gradient measures 6.0 mmHg. Aortic valve Vmax measures 1.53 m/s.  7. Aortic dilatation noted. There is mild dilatation of the ascending  aorta, measuring 39 mm.  8. The inferior vena cava is normal in size with greater than 50% respiratory variability, suggesting right atrial pressure of 3 mmHg. FINDINGS  Left Ventricle: Left ventricular ejection fraction, by estimation, is 60 to 65%. The left ventricle has normal function. The left ventricle has no regional wall motion abnormalities. The left ventricular internal cavity size was normal in size. There is  severe concentric left ventricular hypertrophy. Left ventricular diastolic parameters are consistent with Grade I diastolic dysfunction (impaired relaxation). Elevated left ventricular end-diastolic pressure. Right Ventricle: The right ventricular size is normal. No increase in right ventricular wall thickness. Right ventricular systolic function is normal. Left Atrium: Left atrial size was severely dilated. Right Atrium: Right atrial size was normal in size. Pericardium: There is no evidence of pericardial effusion. Mitral Valve: The mitral valve is normal in structure. Moderate mitral annular calcification. Trivial mitral valve regurgitation. No evidence of mitral valve stenosis. Tricuspid Valve: The tricuspid valve is normal in structure. Tricuspid valve regurgitation is not demonstrated. No evidence of tricuspid stenosis. Aortic Valve: The aortic valve is tricuspid. There is mild calcification of the aortic valve. There is mild thickening of the aortic valve. Aortic valve regurgitation is trivial. Aortic valve sclerosis is present, with no evidence of aortic valve stenosis. Aortic valve mean gradient measures 6.0 mmHg. Aortic valve peak gradient measures 9.4 mmHg. Aortic valve area, by VTI measures 2.28 cm. Pulmonic Valve: The pulmonic valve was normal in structure. Pulmonic valve regurgitation is not visualized. No evidence of pulmonic stenosis. Aorta: Aortic dilatation noted. There is mild dilatation of the ascending aorta, measuring 39 mm. Venous: The inferior vena cava is normal in size  with greater than 50% respiratory variability, suggesting right atrial pressure of 3 mmHg. IAS/Shunts: No atrial level shunt detected by color flow Doppler. Additional Comments: There is a moderate pleural effusion in the left lateral region.  LEFT VENTRICLE PLAX 2D LVIDd:         4.20 cm   Diastology  LVIDs:         2.70 cm   LV e' medial:    4.79 cm/s LV PW:         1.70 cm   LV E/e' medial:  17.9 LV IVS:        1.70 cm   LV e' lateral:   6.69 cm/s LVOT diam:     2.20 cm   LV E/e' lateral: 12.8 LV SV:         86 LV SV Index:   49 LVOT Area:     3.80 cm  RIGHT VENTRICLE TAPSE (M-mode): 1.9 cm LEFT ATRIUM           Index LA diam:      4.00 cm 2.26 cm/m LA Vol (A2C): 67.9 ml 38.30 ml/m LA Vol (A4C): 70.9 ml 39.99 ml/m  AORTIC VALVE AV Area (Vmax):    2.02 cm AV Area (Vmean):   2.12 cm AV Area (VTI):     2.28 cm AV Vmax:           153.00 cm/s AV Vmean:          112.000 cm/s AV VTI:            0.379 m AV Peak Grad:      9.4 mmHg AV Mean Grad:      6.0 mmHg LVOT Vmax:         81.40 cm/s LVOT Vmean:        62.500 cm/s LVOT VTI:          0.227 m LVOT/AV VTI ratio: 0.60  AORTA Ao Root diam: 3.60 cm Ao Asc diam:  3.90 cm MITRAL VALVE MV Area (PHT): 3.34 cm     SHUNTS MV Decel Time: 227 msec     Systemic VTI:  0.23 m MR Peak grad: 43.8 mmHg     Systemic Diam: 2.20 cm MR Vmax:      331.00 cm/s MV E velocity: 85.90 cm/s MV A velocity: 101.00 cm/s MV E/A ratio:  0.85 Skeet Latch MD Electronically signed by Skeet Latch MD Signature Date/Time: 04/21/2022/11:49:44 AM    Final    DG ESOPHAGUS W SINGLE CM (SOL OR THIN BA)  Result Date: 04/21/2022 CLINICAL DATA:  88 year old male with a history of prior esophageal ulcer, prior esophageal stricture and prior esophageal dilatation presenting with ongoing solid-food dysphagia. EXAM: ESOPHAGUS/BARIUM SWALLOW/TABLET STUDY TECHNIQUE: A single contrast examination was performed using thin liquid barium. The patient also swallowed a 13 mm barium tablet under fluoroscopy.  The exam was performed by Durenda Guthrie, PA-C, and was supervised and interpreted by Dr. Kellie Simmering FLUOROSCOPY: Fluoroscopy time: 2 minutes, 48 seconds (19.70 mGy). COMPARISON:  Esophagram 09/07/2006. FINDINGS: Limited examination due to the patient's inability to stand and limited ability to reposition on the fluoroscopy table. All imaging was acquired with the patient in the semi-recumbent position. Marked intermittent esophageal dysmotility with tertiary contractions. A swallowed 13 mm barium tablet did not pass beyond the level of the midesophagus despite a prolonged period of observation. Findings raise the possibility of a mid esophageal stricture, although no definite fixed narrowing was appreciated at this site (within described limitations). Elsewhere, normal caliber and smooth contour of the esophagus. Small hiatal hernia. No gastroesophageal reflux was observed. IMPRESSION: Limited examination due to the patient's inability to stand and limited ability to reposition on the fluoroscopy table. All imaging was acquired with the patient in the semi-recumbent position. Marked intermittent esophageal dysmotility with tertiary contractions. A swallowed 13  mm barium tablet did not pass beyond the level of the midesophagus despite a prolonged period of observation. Findings raise concern for a mid esophageal stricture, although no definite fixed narrowing was appreciated at this site. Consider endoscopy for further evaluation. Small hiatal hernia. Electronically Signed   By: Kellie Simmering D.O.   On: 04/21/2022 11:21   DG Chest 2 View  Result Date: 04/19/2022 CLINICAL DATA:  Cough. EXAM: CHEST - 2 VIEW COMPARISON:  January 30, 2021. FINDINGS: Stable cardiomediastinal silhouette. Right lung is clear. Left lower lobe airspace opacity is noted concerning for pneumonia. Bony thorax is unremarkable. IMPRESSION: Left lower lobe airspace opacity is noted concerning for pneumonia. Followup PA and lateral chest X-ray is  recommended in 3-4 weeks following trial of antibiotic therapy to ensure resolution and exclude underlying malignancy. Electronically Signed   By: Marijo Conception M.D.   On: 04/19/2022 12:55    Microbiology: Results for orders placed or performed during the hospital encounter of 04/19/22  SARS Coronavirus 2 by RT PCR (hospital order, performed in [facility] hospital lab) *cepheid single result test* Anterior Nasal Swab     Status: None   Collection Time: 04/19/22 12:14 PM   Specimen: Anterior Nasal Swab  Result Value Ref Range Status   SARS Coronavirus 2 by RT PCR NEGATIVE NEGATIVE Final    Comment: (NOTE) SARS-CoV-2 target nucleic acids are NOT DETECTED.  The SARS-CoV-2 RNA is generally detectable in upper and lower respiratory specimens during the acute phase of infection. The lowest concentration of SARS-CoV-2 viral copies this assay can detect is 250 copies / mL. A negative result does not preclude SARS-CoV-2 infection and should not be used as the sole basis for treatment or other patient management decisions.  A negative result may occur with improper specimen collection / handling, submission of specimen other than nasopharyngeal swab, presence of viral mutation(s) within the areas targeted by this assay, and inadequate number of viral copies (<250 copies / mL). A negative result must be combined with clinical observations, patient history, and epidemiological information.  Fact Sheet for Patients:   https://www.Alyzza Andringa.info/  Fact Sheet for Healthcare Providers: https://hall.com/  This test is not yet approved or  cleared by the [place] FDA and has been authorized for detection and/or diagnosis of SARS-CoV-2 by FDA under an Emergency Use Authorization (EUA).  This EUA will remain in effect (meaning this test can be used) for the duration of the COVID-19 declaration under Section 564(b)(1) of the Act, 21 U.S.C. section  360bbb-3(b)(1), unless the authorization is terminated or revoked sooner.  Performed at [facility], [place] [address] [place], [place] [address]   Blood culture (routine x 2)     Status: Abnormal   Collection Time: 04/19/22  1:11 PM   Specimen: BLOOD  Result Value Ref Range Status   Specimen Description BLOOD LEFT ANTECUBITAL  Final   Special Requests   Final    BOTTLES DRAWN AEROBIC AND ANAEROBIC Blood Culture adequate volume   Culture  Setup Time   Final    GRAM NEGATIVE RODS ANAEROBIC BOTTLE ONLY CRITICAL RESULT CALLED TO, READ BACK BY AND VERIFIED WITH: PHARMD MING P. [id number] @1708  FH    Culture (A)  Final    HAEMOPHILUS INFLUENZAE SUSCEPTIBILITIES PERFORMED ON PREVIOUS CULTURE WITHIN THE LAST 5 DAYS. SEE SEPARATE REPORT Performed at [facility], [place] [address], [place], [place] [address]    Report Status 04/25/2022 FINAL  Final  MRSA Next Gen by PCR,  Nasal     Status: None   Collection Time: 04/19/22  1:24 PM   Specimen: Nasal Mucosa; Nasal Swab  Result Value Ref Range Status   MRSA by PCR Next Gen NOT DETECTED NOT DETECTED Final    Comment: (NOTE) The GeneXpert MRSA Assay (FDA approved for NASAL specimens only), is one component of a comprehensive MRSA colonization surveillance program. It is not intended to diagnose MRSA infection nor to guide or monitor treatment for MRSA infections. Test performance is not FDA approved in patients less than 49 years old. Performed at [facility], [address], [place], [place] [address]   Blood culture (routine x 2)     Status: Abnormal (Preliminary result)   Collection Time: 04/19/22  1:25 PM   Specimen: BLOOD RIGHT FOREARM  Result Value Ref Range Status   Specimen Description BLOOD RIGHT FOREARM  Final   Special Requests   Final    BOTTLES DRAWN AEROBIC AND ANAEROBIC Blood Culture adequate volume   Culture  Setup Time   Final    GRAM NEGATIVE RODS AEROBIC BOTTLE ONLY CRITICAL RESULT CALLED TO, READ  BACK BY AND VERIFIED WITH: PHARMD MING P. [id number] @1708  FH    Culture (A)  Final    HAEMOPHILUS INFLUENZAE BETA LACTAMASE NEGATIVE HEALTH DEPARTMENT NOTIFIED REFERRED TO [facility] IN [place], [place] FOR IDENTIFICATION/CONFIRMATION Performed at [facility], [address] [facility]. [address] [place], [place]    Report Status PENDING  Incomplete  Blood Culture ID Panel (Reflexed)     Status: Abnormal   Collection Time: 04/19/22  1:25 PM  Result Value Ref Range Status   Enterococcus faecalis NOT DETECTED NOT DETECTED Final   Enterococcus Faecium NOT DETECTED NOT DETECTED Final   Listeria monocytogenes NOT DETECTED NOT DETECTED Final   Staphylococcus species NOT DETECTED NOT DETECTED Final   Staphylococcus aureus (BCID) NOT DETECTED NOT DETECTED Final   Staphylococcus epidermidis NOT DETECTED NOT DETECTED Final   Staphylococcus lugdunensis NOT DETECTED NOT DETECTED Final   Streptococcus species NOT DETECTED NOT DETECTED Final   Streptococcus agalactiae NOT DETECTED NOT DETECTED Final   Streptococcus pneumoniae NOT DETECTED NOT DETECTED Final   Streptococcus pyogenes NOT DETECTED NOT DETECTED Final   A.calcoaceticus-baumannii NOT DETECTED NOT DETECTED Final   Bacteroides fragilis NOT DETECTED NOT DETECTED Final   Enterobacterales NOT DETECTED NOT DETECTED Final   Enterobacter cloacae complex NOT DETECTED NOT DETECTED Final   Escherichia coli NOT DETECTED NOT DETECTED Final   Klebsiella aerogenes NOT DETECTED NOT DETECTED Final   Klebsiella oxytoca NOT DETECTED NOT DETECTED Final   Klebsiella pneumoniae NOT DETECTED NOT DETECTED Final   Proteus species NOT DETECTED NOT DETECTED Final   Salmonella species NOT DETECTED NOT DETECTED Final   Serratia marcescens NOT DETECTED NOT DETECTED Final   Haemophilus influenzae DETECTED (A) NOT DETECTED Final    Comment: CRITICAL RESULT CALLED TO, READ BACK BY AND VERIFIED WITH: PHARMD MING P. 04/21/22 @1708  FH     Neisseria meningitidis NOT DETECTED NOT DETECTED Final   Pseudomonas aeruginosa NOT DETECTED NOT DETECTED Final   Stenotrophomonas maltophilia NOT DETECTED NOT DETECTED Final   Candida albicans NOT DETECTED NOT DETECTED Final   Candida auris NOT DETECTED NOT DETECTED Final   Candida glabrata NOT DETECTED NOT DETECTED Final   Candida krusei NOT DETECTED NOT DETECTED Final   Candida parapsilosis NOT DETECTED NOT DETECTED Final   Candida tropicalis NOT DETECTED NOT DETECTED Final   Cryptococcus neoformans/gattii NOT DETECTED NOT  DETECTED Final    Comment: Performed at [facility], [place] [address], [place], [place] [address]  Culture, blood (Routine X 2) w Reflex to ID Panel     Status: None (Preliminary result)   Collection Time: 04/21/22  7:48 AM   Specimen: BLOOD  Result Value Ref Range Status   Specimen Description BLOOD RIGHT ANTECUBITAL  Final   Special Requests   Final    BOTTLES DRAWN AEROBIC AND ANAEROBIC Blood Culture adequate volume   Culture   Final    NO GROWTH 4 DAYS Performed at [facility], [place] [address], [place], [place] [address]    Report Status PENDING  Incomplete  Culture, blood (Routine X 2) w Reflex to ID Panel     Status: None (Preliminary result)   Collection Time: 04/21/22  7:48 AM   Specimen: BLOOD RIGHT HAND  Result Value Ref Range Status   Specimen Description BLOOD RIGHT HAND  Final   Special Requests   Final    BOTTLES DRAWN AEROBIC AND ANAEROBIC Blood Culture adequate volume   Culture   Final    NO GROWTH 4 DAYS Performed at [facility], [place] [address], [place], [place] [address]    Report Status PENDING  Incomplete   Labs: CBC: Recent Labs  Lab 04/20/22 0101 04/21/22 0748 04/22/22 0712 04/23/22 0252 04/24/22 0756  WBC 8.3 7.9 8.7 8.7 8.3  NEUTROABS  --  5.4  --   --   --   HGB 8.7* 9.0* 9.6* 9.2* 9.8*  HCT 25.9* 27.2* 28.2* 27.1* 28.4*  MCV 103.2* 103.8* 102.5* 100.7* 100.4*  PLT 168 194 228 245 0000000   Basic  Metabolic Panel: Recent Labs  Lab 04/20/22 0101 04/21/22 0748 04/22/22 0712 04/23/22 0252 04/24/22 0756  NA 141 139 137 136 139  K 3.5 3.4* 3.9 3.7 3.8  CL 110 110 107 103 101  CO2 23 21* 25 26 30   GLUCOSE 100* 101* 98 88 93  BUN 31* 22 15 12 11   CREATININE 1.34* 1.05 0.92 0.87 1.03  CALCIUM 7.9* 7.8* 8.3* 8.1* 8.5*  MG  --  1.5* 1.9 1.7 1.6*   Liver Function Tests: Recent Labs  Lab 04/19/22 1150 04/21/22 0748  AST 31 32  ALT 19 27  ALKPHOS 61 57  BILITOT 0.8 0.3  PROT 6.7 5.1*  ALBUMIN 3.1* 2.3*   CBG: No results for input(s): "GLUCAP" in the last 168 hours.  Discharge time spent: greater than 30 minutes.  Signed: Berle Mull, MD [facility]

## 2022-04-25 NOTE — Plan of Care (Signed)
  Problem: Activity: Goal: Ability to tolerate increased activity will improve Outcome: Progressing   Problem: Health Behavior/Discharge Planning: Goal: Ability to manage health-related needs will improve Outcome: Progressing   Problem: Clinical Measurements: Goal: Cardiovascular complication will be avoided Outcome: Progressing   Problem: Safety: Goal: Ability to remain free from injury will improve Outcome: Progressing   Problem: Skin Integrity: Goal: Risk for impaired skin integrity will decrease Outcome: Progressing

## 2022-04-25 NOTE — NC FL2 (Signed)
[place] MEDICAID FL2 LEVEL OF CARE SCREENING TOOL     IDENTIFICATION  Patient Name: Ricky Sandoval Birthdate: 05-May-1922 Sex: male Admission Date (Current Location): 04/19/2022  [facility] and [place] Number:  Producer, television/film/video and Address:  [facility] [place], [address], [place], [place] [address]      Provider Number: [id number]  Attending Physician Name and Address:  Rolly Salter, MD  Relative Name and Phone Number:  [name] [phone]    Current Level of Care: Hospital Recommended Level of Care: Assisted Living Facility Prior Approval Number:    Date Approved/Denied:   PASRR Number:    Discharge Plan: Other (Comment) ([facility])    Current Diagnoses: Patient Active Problem List   Diagnosis Date Noted   H. influenzae septicemia (HCC) 04/20/2022   Sepsis due to pneumonia (HCC) 04/19/2022   Narrow complex tachycardia (HCC) 04/19/2022   Acute respiratory failure with hypoxia (HCC) 04/19/2022   Acute kidney injury superimposed on chronic kidney disease (HCC) 04/19/2022   Essential hypertension 04/19/2022   Normocytic anemia 04/19/2022   History of esophageal stricture 04/19/2022   Ulcer of esophagus without bleeding    Gastritis and gastroduodenitis    Dysphagia    Duodenitis with bleeding    Duodenal erosion    Esophageal stricture     Orientation RESPIRATION BLADDER Height & Weight     Self  O2 ([place] 2) Incontinent, External catheter Weight: 147 lb 0.8 oz (66.7 kg) Height:  5\' 5"  (165.1 cm)  BEHAVIORAL SYMPTOMS/MOOD NEUROLOGICAL BOWEL NUTRITION STATUS      Incontinent Diet (See DC summary)  AMBULATORY STATUS COMMUNICATION OF NEEDS Skin   Total Care Verbally Skin abrasions (Ecchymosis bilateral buttock, arm and hand)                       Personal Care Assistance Level of Assistance  Bathing, Feeding, Dressing Bathing Assistance: Maximum assistance Feeding assistance: Limited assistance Dressing Assistance: Maximum  assistance     Functional Limitations Info  Sight, Hearing, Speech Sight Info: Impaired Hearing Info: Impaired Speech Info: Adequate    SPECIAL CARE FACTORS FREQUENCY                       Contractures Contractures Info: Not present    Additional Factors Info  Code Status, Allergies Code Status Info: DNR Allergies Info: Zestril           Current Medications (04/25/2022):  This is the current hospital active medication list Current Facility-Administered Medications  Medication Dose Route Frequency Provider Last Rate Last Admin   acetaminophen (TYLENOL) tablet 650 mg  650 mg Oral Q6H PRN 04/27/2022 A, MD       Or   acetaminophen (TYLENOL) suppository 650 mg  650 mg Rectal Q6H PRN Madelyn Flavors A, MD       amoxicillin (AMOXIL) capsule 1,000 mg  1,000 mg Oral Q8H Madelyn Flavors, Daiva Eves, MD   1,000 mg at 04/25/22 1522   aspirin EC tablet 81 mg  81 mg Oral Daily 04/27/22 A, MD   81 mg at 04/25/22 04/27/22   cyanocobalamin (VITAMIN B12) tablet 1,000 mcg  1,000 mcg Oral Daily [id number], MD   1,000 mcg at 04/25/22 04/27/22   docusate sodium (COLACE) capsule 200 mg  200 mg Oral Daily 3220, MD   200 mg at 04/25/22 0838   enoxaparin (LOVENOX) injection 40 mg  40 mg Subcutaneous  Q24H Fuller Plan A, MD   40 mg at 04/24/22 2222   feeding supplement (ENSURE ENLIVE / ENSURE PLUS) liquid 237 mL  237 mL Oral BID BM Lavina Hamman, MD   237 mL at 04/25/22 1524   fluticasone (FLONASE) 50 MCG/ACT nasal spray 1 spray  1 spray Each Nare q AM Fuller Plan A, MD   1 spray at [id number]   folic acid (FOLVITE) tablet 1 mg  1 mg Oral Daily Lavina Hamman, MD   1 mg at 04/25/22 [id number]   ketotifen (ZADITOR) 0.025 % ophthalmic solution 1 drop  1 drop Both Eyes TID Fuller Plan A, MD   1 drop at 04/25/22 1523   levalbuterol (XOPENEX) nebulizer solution 0.63 mg  0.63 mg Nebulization Q6H PRN Norval Morton, MD       levothyroxine (SYNTHROID) tablet 75 mcg  75 mcg Oral Q0600  Norval Morton, MD   75 mcg at 04/25/22 [id number]   lip balm (CARMEX) ointment   Topical PRN Norval Morton, MD   Given at 04/23/22 2246   loratadine (CLARITIN) tablet 10 mg  10 mg Oral Daily Fuller Plan A, MD   10 mg at 04/25/22 [id number]   menthol-cetylpyridinium (CEPACOL) lozenge 3 mg  1 lozenge Oral PRN Norval Morton, MD       multivitamin with minerals tablet 1 tablet  1 tablet Oral Daily Lavina Hamman, MD   1 tablet at 04/25/22 [id number]   nortriptyline (PAMELOR) capsule 25 mg  25 mg Oral QHS Adair Lemar, Rondell A, MD   25 mg at 04/24/22 2222   ondansetron (ZOFRAN) tablet 4 mg  4 mg Oral Q6H PRN Norval Morton, MD       Or   ondansetron (ZOFRAN) injection 4 mg  4 mg Intravenous Q6H PRN Norval Morton, MD       Oral care mouth rinse  15 mL Mouth Rinse PRN Fuller Plan A, MD       pantoprazole (PROTONIX) EC tablet 40 mg  40 mg Oral BID AC Zyon Rosser, Rondell A, MD   40 mg at 04/25/22 0837   pneumococcal 20-valent conjugate vaccine (PREVNAR 20) injection 0.5 mL  0.5 mL Intramuscular Tomorrow-1000 Khaza Blansett, Rondell A, MD       polyvinyl alcohol (LIQUIFILM TEARS) 1.4 % ophthalmic solution 2 drop  2 drop Both Eyes BID Tamala Julian, Rondell A, MD   2 drop at 04/25/22 0845   simvastatin (ZOCOR) tablet 20 mg  20 mg Oral QHS Rayyan Orsborn, Rondell A, MD   20 mg at 04/24/22 2222   sucralfate (CARAFATE) tablet 1 g  1 g Oral BID WC Oriel Rumbold, Rondell A, MD   1 g at 04/25/22 0837   tamsulosin (FLOMAX) capsule 0.4 mg  0.4 mg Oral QHS Alysah Carton, Rondell A, MD   0.4 mg at 04/24/22 2222   triamcinolone ointment (KENALOG) 0.1 % 1 Application  1 Application Topical BID Fuller Plan A, MD   1 Application at [id number]     Discharge Medications:  acetaminophen 500 MG tablet Commonly known as: TYLENOL Take 1,000 mg by mouth 2 (two) times daily.    amoxicillin 500 MG capsule Commonly known as: AMOXIL Take 2 capsules (1,000 mg total) by mouth every 8 (eight) hours for 2 days.    ascorbic acid 500 MG tablet Commonly known as:  VITAMIN C Take 500 mg by mouth daily.    aspirin 81 MG tablet Take 81 mg by mouth daily.  carboxymethylcellulose 0.5 % Soln Commonly known as: REFRESH PLUS Place 2 drops into both eyes 2 (two) times daily.    cyanocobalamin 1000 MCG tablet Take 1 tablet (1,000 mcg total) by mouth daily. Start taking on: April 26, 2022    diphenhydramine-acetaminophen 25-500 MG Tabs tablet Commonly known as: TYLENOL PM Take 1 tablet by mouth at bedtime.    docusate sodium 100 MG capsule Commonly known as: COLACE Take 300 mg by mouth daily.    feeding supplement Liqd Take 237 mLs by mouth 2 (two) times daily between meals. Start taking on: April 26, 2022    fluticasone 50 MCG/ACT nasal spray Commonly known as: FLONASE Place 1 spray into both nostrils in the morning.    folic acid 1 MG tablet Commonly known as: FOLVITE Take 1 tablet (1 mg total) by mouth daily. Start taking on: April 26, 2022    levothyroxine 75 MCG tablet Commonly known as: SYNTHROID Take 75 mcg by mouth daily before breakfast.    loratadine 10 MG tablet Commonly known as: CLARITIN Take 10 mg by mouth daily. For 7 days. Starting 04/14/22    MUCINEX CHILDRENS PO Take 20 mLs by mouth 2 (two) times daily. For 7 days. Starting 04/14/22    nortriptyline 25 MG capsule Commonly known as: PAMELOR Take 25 mg by mouth at bedtime.    OVER THE COUNTER MEDICATION Apply 1 application  topically See admin instructions. Baza Protect 1%-12% cream. Apply to buttocks after each incontinent episode or urinating bed as needed.    pantoprazole 40 MG tablet Commonly known as: Protonix Take 1 tablet (40 mg total) by mouth 2 (two) times daily before a meal.    PreserVision AREDS 2 Chew Chew 2 tablets by mouth daily.    simvastatin 40 MG tablet Commonly known as: ZOCOR Take 20 mg by mouth at bedtime.    sucralfate 1 g tablet Commonly known as: CARAFATE Take 1 g by mouth 2 (two) times daily with a meal.    tamsulosin 0.4 MG  Caps capsule Commonly known as: FLOMAX Take 0.4 mg by mouth at bedtime.    triamcinolone ointment 0.1 % Commonly known as: KENALOG Apply 1 Application topically 2 (two) times daily. To affected area on left side below left buttocks. For 14 days. Starting 04/06/22    Vitamin D-3 25 MCG (1000 UT) Caps Take 2,000 Units by mouth daily.      Relevant Imaging Results:  Relevant Lab Results:   Additional Information SS# [id number]  Carley Hammed, [place]

## 2022-04-26 LAB — CULTURE, BLOOD (ROUTINE X 2)
Culture: NO GROWTH
Culture: NO GROWTH
Special Requests: ADEQUATE
Special Requests: ADEQUATE

## 2022-04-26 NOTE — TOC Transition Note (Addendum)
Transition of Care [facility]) - CM/SW Discharge Note   Patient Details  Name: Ricky Sandoval MRN: [id number] Date of Birth: 1921-11-19  Transition of [facility]) CM/SW Contact:  Delilah Shan, LCSWA Phone Number: 04/26/2022, 10:41 AM    Update: 4:23pm CSW informed patient not going to dc today. CSW will follow up tomorrow. ALF and [facility] informed.     CSW and CM spoke with Victorino Dike DON and she stated it was okay for adapt to come out to do the overnight pulse oximetry to be done at the facility.   Clinical Narrative:     Patient will DC to: [facility]   Anticipated DC date: 04/26/2022  Family notified: [name]   Will Transport by: Sharin Mons  ?  Per MD patient ready for DC to [facility] with palliative services to follow . RN, patient, patient's family, Maralyn Sago with [facility],and facility notified of DC. Discharge Summary,HH orders,FL2,sent to facility. RN given number for report tele#[phone] RM# [phone] . DC packet on chart. DNR signed by MD attached to patients DC packet.   CSW signing off.    Final next level of care: Assisted Living Barriers to Discharge: No Barriers Identified   Patient Goals and CMS Choice   CMS Medicare.gov Compare Post Acute Care list provided to:: Patient Represenative (must comment) (Patients son [name]) Choice offered to / list presented to : Adult Children [name])  Discharge Placement              Patient chooses bed at: [facility] Patient to be transferred to facility by: PTAR Name of family member notified: [name] Patient and family notified of of transfer: 04/26/22  Discharge Plan and Services In-house Referral: Clinical Social Work                                   Social Determinants of Health (SDOH) Interventions     Readmission Risk Interventions     No data to display

## 2022-04-26 NOTE — Progress Notes (Signed)
Attempted to call report three times to [facility].

## 2022-04-26 NOTE — Care Management Important Message (Signed)
Important Message  Patient Details  Name: Ricky Sandoval MRN: [id number] Date of Birth: 11/07/1921   Medicare Important Message Given:  Yes     Renie Ora 04/26/2022, 9:44 AM

## 2022-04-26 NOTE — TOC Progression Note (Signed)
Transition of Care [facility]) - Progression Note    Patient Details  Name: Ricky Sandoval MRN: [id number] Date of Birth: 29-Oct-1921  [facility] of [facility]) CM/SW Contact  Delilah Shan, LCSWA Phone Number: 04/26/2022, 10:21 AM  Clinical Narrative:     CSW spoke with Dellia Cloud with [facility] who confirmed we will need to set up 02 for patient. CSW informed CM. Will reschedule PTAR once 02 is set up at ALF. CSW will continue to follow.  Expected Discharge Plan: Assisted Living ([facility]) Barriers to Discharge: Barriers Resolved  Expected Discharge Plan and Services Expected Discharge Plan: Assisted Living ([facility]) In-house Referral: Clinical Social Work       Expected Discharge Date: 04/26/22                                     Social Determinants of Health (SDOH) Interventions    Readmission Risk Interventions     No data to display

## 2022-04-26 NOTE — Discharge Summary (Signed)
Physician Discharge Summary  Ricky Sandoval [id number] DOB: [birth year] DOA: 04/19/2022  PCP: [name], MD  Admit date: 04/19/2022 Discharge date: 04/26/2022 Admitted From: ALF Disposition: ALF  Recommendations for Outpatient Follow-up:  Follow up with PCP in 1 week Check BMP and CBC in 1 to 2 weeks Consider outpatient referral to sleep clinic to rule out sleep apnea Consider outpatient referral to gastroenterology for evaluation of esophageal dysmotility/stricture Recommend referral to palliative care outpatient Please follow up on the following pending results: None  Home Health: PT/OT/RN/SLP Equipment/Devices: 2 L oxygen by [place] at night  Discharge Condition: Stable but guarded prognosis CODE STATUS: DNR/DNI  Follow-up Information     Ricky, [name], MD. Schedule an appointment as soon as possible for a visit in 1 week(s).   Specialty: Internal Medicine Contact information: [name] [address] [phone]         [facility] Follow up.   Why: Oxygen and Nebulizer machine Contact information: [name] [address] [phone]         [name] Follow up.   Specialty: Home Health Services Why: Registered Nurse, Physical Therapy,Occupational Therapy-office to call with visit times. Contact information: [address] [name] [address] [phone]                 Hospital course 88 year old M with PMH of CKD-3A, or esophageal dysmotility/stricture, dysphagia, HTN and HLD admitted for severe sepsis due to left lower lobe pneumonia secondary to haemophilus influenza infection and bacteremia along acute respiratory failure with hypoxia and AKI.  Reportedly desaturated to 85% on room air requiring 2 L by nasal cannula to maintain appropriate saturation.  Patient was initially started on vancomycin and cefepime with improvement in his symptoms.  ID consulted and patient was  transition to p.o. amoxicillin for 2 days on discharge to complete antibiotic course.  Speech therapy recommended dysphagia 3 diet.   Of note, patient's acute respiratory failure resolved but noted to have desaturation in low 80s when he falls asleep.  He is discharged on 2 L by nasal cannula to be worn at night.  See individual problem list below for more.   Problems addressed during this hospitalization Principal Problem:   Sepsis due to pneumonia [facility]) Active Problems:   Acute respiratory failure with hypoxia (HCC)   H. influenzae septicemia ([place])   Acute kidney injury superimposed on chronic kidney disease (HCC)   Narrow complex tachycardia (HCC)   Essential hypertension   Normocytic anemia   History of esophageal stricture   Severe sepsis due to pneumonia and H. influenzae bacteremia: POA Acute respiratory failure with hypoxia and sepsis secondary to pneumonia due to H. influenzae bacteremia, present on admission. Meeting SIRS criteria, patient noted to be febrile up to 104 F with tachycardia and tachypnea on admission.  O2 saturations noted to be as low as 85% on room air with improvement on 2 L of nasal cannula oxygen.  He had reported having intermittent cough and was noted to have left lobe pneumonia on chest x-ray.  Question if secondary to aspiration.  Blood cultures have been attained and patient was started on empiric antibiotics of vancomycin and cefepime. Blood culture came back positive for H. influenzae.  Repeat blood cultures negative for 2 days. ID consulted. Transitioning to PO amoxicillin for 2 more day.  MRSA PCR negative. Continue incentive spirometry and flutter valve Speech therapy recommended dysphagia 3 diet. Patient desaturated to 83% on room  air at night while asleep.  Discharged on 2 L by [place] at night.   Dysphagia/history of esophageal dysmotility/esophageal stricture/esophageal ulcer X-ray esophagus shows evidence of stricture again at this time on 04/21/2022.  although no definite fixed narrowing was appreciated at this site. -SLP recommended dysphagia 3 diet. -Continue Protonix -May consider outpatient follow-up with GI although I doubt candidacy for procedure given his age   Acute kidney injury superimposed on chronic kidney disease stage IIIa: AKI resolved. -Recheck in 1 to 2 weeks   Narrow complex tachycardia/elevated troponin: Elevated troponin likely demand ischemia. TTE with preserved EF and no valvular abnormality or RWMA.    Essential hypertension: Normotensive off home antihypertensive meds.   Macrocytic anemia/related B12 deficiency: H&H stable. -Continue B12 supplementation  BPH Continue Flomax   GERD Continue Protonix  Debility/physical deconditioning -PT/OT eval  Inadequate oral intake Nutrition Problem: Inadequate oral intake Etiology: acute illness Signs/Symptoms: meal completion < 50% Interventions: Ensure Enlive (each supplement provides 350kcal and 20 grams of protein), MVI     Vital signs Vitals:   04/26/22 0900 04/26/22 0950 04/26/22 1205 04/26/22 1242  BP:  116/75    Pulse:   79 80  Temp:      Resp:  19    Height:      Weight:      SpO2: 95% 95% 95% 92%  TempSrc:  Oral    BMI (Calculated):         Discharge exam  GENERAL: No apparent distress.  Nontoxic. HEENT: MMM.  Vision and hearing grossly intact.  NECK: Supple.  No apparent JVD.  RESP:  No IWOB.  Fair aeration bilaterally. CVS:  RRR. Heart sounds normal.  ABD/GI/GU: BS+. Abd soft, NTND.  MSK/EXT:  Moves extremities. No apparent deformity. No edema.  SKIN: no apparent skin lesion or wound NEURO: Awake and alert. Oriented fairly.  No apparent focal neuro deficit. PSYCH: Calm. Normal affect.   Discharge Instructions Discharge Instructions     DIET DYS 3   Complete by: As directed    Fluid consistency: Thin      Allergies as of 04/26/2022       Reactions   Zestril [lisinopril] Other (See Comments)   Airway constriction         Medication List     STOP taking these medications    amLODipine 2.5 MG tablet Commonly known as: NORVASC   Anti-Diarrheal 2 MG tablet Generic drug: loperamide   cetirizine 10 MG tablet Commonly known as: ZYRTEC   diphenhydramine-acetaminophen 25-500 MG Tabs tablet Commonly known as: TYLENOL PM   flunisolide 25 MCG/ACT (0.025%) Soln Commonly known as: NASALIDE   ketotifen 0.025 % ophthalmic solution Commonly known as: ZADITOR   metoprolol succinate 25 MG 24 hr tablet Commonly known as: TOPROL-XL       TAKE these medications    acetaminophen 500 MG tablet Commonly known as: TYLENOL Take 1,000 mg by mouth 2 (two) times daily.   amoxicillin 500 MG capsule Commonly known as: AMOXIL Take 2 capsules (1,000 mg total) by mouth every 8 (eight) hours for 2 days.   ascorbic acid 500 MG tablet Commonly known as: VITAMIN C Take 500 mg by mouth daily.   aspirin 81 MG tablet Take 81 mg by mouth daily.   carboxymethylcellulose 0.5 % Soln Commonly known as: REFRESH PLUS Place 2 drops into both eyes 2 (two) times daily.   cyanocobalamin 1000 MCG tablet Take 1 tablet (1,000 mcg total) by mouth daily.   docusate sodium 100  MG capsule Commonly known as: COLACE Take 300 mg by mouth daily.   feeding supplement Liqd Take 237 mLs by mouth 2 (two) times daily between meals.   fluticasone 50 MCG/ACT nasal spray Commonly known as: FLONASE Place 1 spray into both nostrils in the morning.   folic acid 1 MG tablet Commonly known as: FOLVITE Take 1 tablet (1 mg total) by mouth daily.   levothyroxine 75 MCG tablet Commonly known as: SYNTHROID Take 75 mcg by mouth daily before breakfast.   loratadine 10 MG tablet Commonly known as: CLARITIN Take 10 mg by mouth daily. For 7 days. Starting 04/14/22   MUCINEX CHILDRENS PO Take 20 mLs by mouth 2 (two) times daily. For 7 days. Starting 04/14/22   nortriptyline 25 MG capsule Commonly known as: PAMELOR Take 25 mg by mouth at  bedtime.   OVER THE COUNTER MEDICATION Apply 1 application  topically See admin instructions. [place] Protect 1%-12% cream. Apply to buttocks after each incontinent episode or urinating bed as needed.   pantoprazole 40 MG tablet Commonly known as: Protonix Take 1 tablet (40 mg total) by mouth 2 (two) times daily before a meal.   PreserVision AREDS 2 Chew Chew 2 tablets by mouth daily.   simvastatin 40 MG tablet Commonly known as: ZOCOR Take 20 mg by mouth at bedtime.   sucralfate 1 g tablet Commonly known as: CARAFATE Take 1 g by mouth 2 (two) times daily with a meal.   tamsulosin 0.4 MG Caps capsule Commonly known as: FLOMAX Take 0.4 mg by mouth at bedtime.   triamcinolone ointment 0.1 % Commonly known as: KENALOG Apply 1 Application topically 2 (two) times daily. To affected area on left side below left buttocks. For 14 days. Starting 04/06/22   Vitamin D-3 25 MCG (1000 UT) Caps Take 2,000 Units by mouth daily.               Durable Medical Equipment  (From admission, onward)           Start     Ordered   04/26/22 1034  For home use only DME oxygen  Once       Question Answer Comment  Length of Need 6 Months   Mode or (Route) Nasal cannula   Liters per Minute 2   Frequency Only at night (stationary unit needed)   Oxygen delivery system Gas      04/26/22 1033   04/26/22 1034  For home use only DME Nebulizer machine  Once       Question Answer Comment  Patient needs a nebulizer to treat with the following condition Shortness of breath   Length of Need 6 Months      04/26/22 1033            Consultations: Infectious disease  Procedures/Studies: None   DG Chest 1 View  Result Date: 04/25/2022 CLINICAL DATA:  19758.  Respiratory failure. EXAM: CHEST  1 VIEW COMPARISON:  Chest x-ray 04/19/2022 FINDINGS: The heart and mediastinal contours are unchanged. Aortic calcification. Low lung volumes. Retrocardiac airspace opacity. No pulmonary edema. No  definite pleural effusion. No pneumothorax. No acute osseous abnormality. PO contrast noted Partially opacifying the bowel within the left upper abdomen. IMPRESSION: 1. Low lung volumes with retrocardiac airspace opacity that could represent a combination of infection/inflammation and/or atelectasis. 2.  Aortic Atherosclerosis (ICD10-I70.0). Electronically Signed   By: Tish Frederickson M.D.   On: 04/25/2022 20:54   DG Swallowing Func-Speech Pathology  Result Date: 04/25/2022 Table  formatting from the original result was not included. Images from the original result were not included. Objective Swallowing Evaluation: Type of Study: MBS-Modified Barium Swallow Study  Patient Details Name: DENY ANZUALDA MRN: [id number] Date of Birth: 1922/03/03 Today's Date: 04/25/2022 Time: SLP Start Time (ACUTE ONLY): 1420 -SLP Stop Time (ACUTE ONLY): 1445 SLP Time Calculation (min) (ACUTE ONLY): 25 min Past Medical History: Past Medical History: Diagnosis Date  Diabetes type 2, controlled ([place])   High cholesterol   Hypertension   Hypothyroid  Past Surgical History: Past Surgical History: Procedure Laterality Date  BALLOON DILATION N/A 01/30/2021  Procedure: BALLOON DILATION;  Surgeon: Irene Shipper, MD;  Location: [facility] ENDOSCOPY;  Service: Endoscopy;  Laterality: N/A;  BIOPSY  01/30/2021  Procedure: BIOPSY;  Surgeon: Irene Shipper, MD;  Location: [facility] ENDOSCOPY;  Service: Endoscopy;;  BIOPSY  02/01/2021  Procedure: BIOPSY;  Surgeon: Ladene Artist, MD;  Location: [facility] ENDOSCOPY;  Service: Endoscopy;;  BIOPSY  02/08/2021  Procedure: BIOPSY;  Surgeon: Thornton Park, MD;  Location: [facility] ENDOSCOPY;  Service: Gastroenterology;;  ESOPHAGOGASTRODUODENOSCOPY N/A 02/01/2021  Procedure: ESOPHAGOGASTRODUODENOSCOPY (EGD);  Surgeon: Ladene Artist, MD;  Location: [facility] ENDOSCOPY;  Service: Endoscopy;  Laterality: N/A;  ESOPHAGOGASTRODUODENOSCOPY (EGD) WITH PROPOFOL N/A 01/30/2021  Procedure: ESOPHAGOGASTRODUODENOSCOPY (EGD) WITH PROPOFOL;  Surgeon: Irene Shipper, MD;  Location: [facility] ENDOSCOPY;  Service: Endoscopy;  Laterality: N/A;  ESOPHAGOGASTRODUODENOSCOPY (EGD) WITH PROPOFOL N/A 02/08/2021  Procedure: ESOPHAGOGASTRODUODENOSCOPY (EGD) WITH PROPOFOL;  Surgeon: Thornton Park, MD;  Location: [facility] ENDOSCOPY;  Service: Gastroenterology;  Laterality: N/A;  FOREIGN BODY REMOVAL  01/30/2021  Procedure: FOREIGN BODY REMOVAL;  Surgeon: Irene Shipper, MD;  Location: [place];  Service: Endoscopy;;  SAVORY DILATION N/A 02/01/2021  Procedure: Azzie Almas DILATION;  Surgeon: Ladene Artist, MD;  Location: [facility] ENDOSCOPY;  Service: Endoscopy;  Laterality: N/A; HPI: TRAVES STOGSDILL is a 88 y.o. male with medical history significant of hypertension hyperlipidemia, diabetes mellitus type 2, esophageal dysphagia, esophageal ulcer, presbyesophagus, with esophageal dilation, and hypothyroidism who presented due to concern for cough and shortness of breath. CXR remarkable for "left lower lobe airspace opacity is noted concerning for pneumonia."  Per chart review,  esophagram 8/25 revealed marked intermittent esophageal dysmotility with tertiary contractions; hiatal hernia. A 13 mm barium tablet did not pass beyond the level of the midesophagus despite a prolonged period of observation. Findings raise concern for a mid esophageal stricture  Subjective: alert, participatory  Recommendations for follow up therapy are one component of a multi-disciplinary discharge planning process, led by the attending physician.  Recommendations may be updated based on patient status, additional functional criteria and insurance authorization. Assessment / Plan / Recommendation   04/25/2022   2:00 PM Clinical Impressions Clinical Impression Pt presents with quite functional oropharyngeal swallow marked by penetration of thin liquids (PAS 2 and 2) with no observed aspiration. There was good pharyngeal stripping and no residue post swallow. He consumed crackers, puree, and thin liquids.  He exhibited coughing  throughout the study - this was not correlated with laryngeal penetration. His cough may be a response to the esophageal dysfunction noted on 8/25 esophagram. Recommend continuing current diet of soft mechanical, thin liquids, and meds crushed/broken and given in puree. SLP Visit Diagnosis Dysphagia, pharyngoesophageal phase (R13.14) Impact on safety and function Mild aspiration risk     04/25/2022   2:00 PM Treatment Recommendations Treatment Recommendations No treatment recommended at this time     04/20/2022   9:00 AM Prognosis Prognosis for Safe Diet Advancement Good  04/25/2022   2:00 PM Diet Recommendations SLP Diet Recommendations Dysphagia 3 (Mech soft) solids;Thin liquid Liquid Administration via Cup;Straw Medication Administration Crushed with puree Compensations Minimize environmental distractions;Small sips/bites;Slow rate Postural Changes Remain semi-upright after after feeds/meals (Comment)     04/25/2022   2:00 PM Other Recommendations Oral Care Recommendations Oral care BID Follow Up Recommendations Long-term institutional care without follow-up therapy Assistance recommended at discharge Intermittent Supervision/Assistance   04/20/2022   9:00 AM Frequency and Duration  Speech Therapy Frequency (ACUTE ONLY) min 2x/week Treatment Duration 2 weeks     04/25/2022   2:00 PM Oral Phase Oral Phase [facility]    04/25/2022   2:00 PM Pharyngeal Phase Pharyngeal Phase Impaired Pharyngeal- Thin Cup Penetration/Aspiration before swallow Pharyngeal Material enters airway, remains ABOVE vocal cords then ejected out;Material enters airway, remains ABOVE vocal cords and not ejected out Pharyngeal- Thin Straw Penetration/Aspiration before swallow Pharyngeal Material enters airway, remains ABOVE vocal cords then ejected out;Material enters airway, remains ABOVE vocal cords and not ejected out     No data to display    Juan Quam Laurice 04/25/2022, 3:41 PM                     ECHOCARDIOGRAM COMPLETE  Result Date:  04/21/2022    ECHOCARDIOGRAM REPORT   Patient Name:   JABREEL MACDOUGAL Date of Exam: 04/21/2022 Medical Rec #:  [id number]       Height:       65.0 in Accession #:    [id number]      Weight:       154.5 lb Date of Birth:  05-05-1922        BSA:          1.773 m Patient Age:    89 years        BP:           124/53 mmHg Patient Gender: M               HR:           72 bpm. Exam Location:  Inpatient Procedure: 2D Echo, Cardiac Doppler and Color Doppler Indications:    Abnormal ECG  History:        Patient has prior history of Echocardiogram examinations, most                 recent 07/04/2016. Risk Factors:Hypertension, Dyslipidemia and                 Diabetes.  Sonographer:    Memory Argue Referring Phys: [id number] RONDELL A SMITH IMPRESSIONS  1. Left ventricular ejection fraction, by estimation, is 60 to 65%. The left ventricle has normal function. The left ventricle has no regional wall motion abnormalities. There is severe concentric left ventricular hypertrophy. Left ventricular diastolic  parameters are consistent with Grade I diastolic dysfunction (impaired relaxation). Elevated left ventricular end-diastolic pressure.  2. Right ventricular systolic function is normal. The right ventricular size is normal.  3. Left atrial size was severely dilated.  4. Moderate pleural effusion in the left lateral region.  5. The mitral valve is normal in structure. Trivial mitral valve regurgitation. No evidence of mitral stenosis. Moderate mitral annular calcification.  6. The aortic valve is tricuspid. There is mild calcification of the aortic valve. There is mild thickening of the aortic valve. Aortic valve regurgitation is trivial. Aortic valve sclerosis is present, with no evidence of aortic valve stenosis. Aortic valve area, by VTI measures 2.28 cm.  Aortic valve mean gradient measures 6.0 mmHg. Aortic valve Vmax measures 1.53 m/s.  7. Aortic dilatation noted. There is mild dilatation of the ascending aorta, measuring 39 mm.   8. The inferior vena cava is normal in size with greater than 50% respiratory variability, suggesting right atrial pressure of 3 mmHg. FINDINGS  Left Ventricle: Left ventricular ejection fraction, by estimation, is 60 to 65%. The left ventricle has normal function. The left ventricle has no regional wall motion abnormalities. The left ventricular internal cavity size was normal in size. There is  severe concentric left ventricular hypertrophy. Left ventricular diastolic parameters are consistent with Grade I diastolic dysfunction (impaired relaxation). Elevated left ventricular end-diastolic pressure. Right Ventricle: The right ventricular size is normal. No increase in right ventricular wall thickness. Right ventricular systolic function is normal. Left Atrium: Left atrial size was severely dilated. Right Atrium: Right atrial size was normal in size. Pericardium: There is no evidence of pericardial effusion. Mitral Valve: The mitral valve is normal in structure. Moderate mitral annular calcification. Trivial mitral valve regurgitation. No evidence of mitral valve stenosis. Tricuspid Valve: The tricuspid valve is normal in structure. Tricuspid valve regurgitation is not demonstrated. No evidence of tricuspid stenosis. Aortic Valve: The aortic valve is tricuspid. There is mild calcification of the aortic valve. There is mild thickening of the aortic valve. Aortic valve regurgitation is trivial. Aortic valve sclerosis is present, with no evidence of aortic valve stenosis. Aortic valve mean gradient measures 6.0 mmHg. Aortic valve peak gradient measures 9.4 mmHg. Aortic valve area, by VTI measures 2.28 cm. Pulmonic Valve: The pulmonic valve was normal in structure. Pulmonic valve regurgitation is not visualized. No evidence of pulmonic stenosis. Aorta: Aortic dilatation noted. There is mild dilatation of the ascending aorta, measuring 39 mm. Venous: The inferior vena cava is normal in size with greater than 50%  respiratory variability, suggesting right atrial pressure of 3 mmHg. IAS/Shunts: No atrial level shunt detected by color flow Doppler. Additional Comments: There is a moderate pleural effusion in the left lateral region.  LEFT VENTRICLE PLAX 2D LVIDd:         4.20 cm   Diastology LVIDs:         2.70 cm   LV e' medial:    4.79 cm/s LV PW:         1.70 cm   LV E/e' medial:  17.9 LV IVS:        1.70 cm   LV e' lateral:   6.69 cm/s LVOT diam:     2.20 cm   LV E/e' lateral: 12.8 LV SV:         86 LV SV Index:   49 LVOT Area:     3.80 cm  RIGHT VENTRICLE TAPSE (M-mode): 1.9 cm LEFT ATRIUM           Index LA diam:      4.00 cm 2.26 cm/m LA Vol (A2C): 67.9 ml 38.30 ml/m LA Vol (A4C): 70.9 ml 39.99 ml/m  AORTIC VALVE AV Area (Vmax):    2.02 cm AV Area (Vmean):   2.12 cm AV Area (VTI):     2.28 cm AV Vmax:           153.00 cm/s AV Vmean:          112.000 cm/s AV VTI:            0.379 m AV Peak Grad:      9.4 mmHg AV Mean Grad:  6.0 mmHg LVOT Vmax:         81.40 cm/s LVOT Vmean:        62.500 cm/s LVOT VTI:          0.227 m LVOT/AV VTI ratio: 0.60  AORTA Ao Root diam: 3.60 cm Ao Asc diam:  3.90 cm MITRAL VALVE MV Area (PHT): 3.34 cm     SHUNTS MV Decel Time: 227 msec     Systemic VTI:  0.23 m MR Peak grad: 43.8 mmHg     Systemic Diam: 2.20 cm MR Vmax:      331.00 cm/s MV E velocity: 85.90 cm/s MV A velocity: 101.00 cm/s MV E/A ratio:  0.85 Chilton Si MD Electronically signed by Chilton Si MD Signature Date/Time: 04/21/2022/11:49:44 AM    Final    DG ESOPHAGUS W SINGLE CM (SOL OR THIN BA)  Result Date: 04/21/2022 CLINICAL DATA:  88 year old male with a history of prior esophageal ulcer, prior esophageal stricture and prior esophageal dilatation presenting with ongoing solid-food dysphagia. EXAM: ESOPHAGUS/BARIUM SWALLOW/TABLET STUDY TECHNIQUE: A single contrast examination was performed using thin liquid barium. The patient also swallowed a 13 mm barium tablet under fluoroscopy. The exam was performed  by Lawernce Ion, PA-C, and was supervised and interpreted by Dr. Jackey Loge FLUOROSCOPY: Fluoroscopy time: 2 minutes, 48 seconds (19.70 mGy). COMPARISON:  Esophagram 09/07/2006. FINDINGS: Limited examination due to the patient's inability to stand and limited ability to reposition on the fluoroscopy table. All imaging was acquired with the patient in the semi-recumbent position. Marked intermittent esophageal dysmotility with tertiary contractions. A swallowed 13 mm barium tablet did not pass beyond the level of the midesophagus despite a prolonged period of observation. Findings raise the possibility of a mid esophageal stricture, although no definite fixed narrowing was appreciated at this site (within described limitations). Elsewhere, normal caliber and smooth contour of the esophagus. Small hiatal hernia. No gastroesophageal reflux was observed. IMPRESSION: Limited examination due to the patient's inability to stand and limited ability to reposition on the fluoroscopy table. All imaging was acquired with the patient in the semi-recumbent position. Marked intermittent esophageal dysmotility with tertiary contractions. A swallowed 13 mm barium tablet did not pass beyond the level of the midesophagus despite a prolonged period of observation. Findings raise concern for a mid esophageal stricture, although no definite fixed narrowing was appreciated at this site. Consider endoscopy for further evaluation. Small hiatal hernia. Electronically Signed   By: Jackey Loge D.O.   On: 04/21/2022 11:21   DG Chest 2 View  Result Date: 04/19/2022 CLINICAL DATA:  Cough. EXAM: CHEST - 2 VIEW COMPARISON:  January 30, 2021. FINDINGS: Stable cardiomediastinal silhouette. Right lung is clear. Left lower lobe airspace opacity is noted concerning for pneumonia. Bony thorax is unremarkable. IMPRESSION: Left lower lobe airspace opacity is noted concerning for pneumonia. Followup PA and lateral chest X-ray is recommended in 3-4 weeks  following trial of antibiotic therapy to ensure resolution and exclude underlying malignancy. Electronically Signed   By: Lupita Raider M.D.   On: 04/19/2022 12:55       The results of significant diagnostics from this hospitalization (including imaging, microbiology, ancillary and laboratory) are listed below for reference.     Microbiology: Recent Results (from the past 240 hour(s))  SARS Coronavirus 2 by RT PCR (hospital order, performed in [facility] hospital lab) *cepheid single result test* Anterior Nasal Swab     Status: None   Collection Time: 04/19/22 12:14 PM   Specimen: Anterior Nasal  Swab  Result Value Ref Range Status   SARS Coronavirus 2 by RT PCR NEGATIVE NEGATIVE Final    Comment: (NOTE) SARS-CoV-2 target nucleic acids are NOT DETECTED.  The SARS-CoV-2 RNA is generally detectable in upper and lower respiratory specimens during the acute phase of infection. The lowest concentration of SARS-CoV-2 viral copies this assay can detect is 250 copies / mL. A negative result does not preclude SARS-CoV-2 infection and should not be used as the sole basis for treatment or other patient management decisions.  A negative result may occur with improper specimen collection / handling, submission of specimen other than nasopharyngeal swab, presence of viral mutation(s) within the areas targeted by this assay, and inadequate number of viral copies (<250 copies / mL). A negative result must be combined with clinical observations, patient history, and epidemiological information.  Fact Sheet for Patients:   https://www.patel.info/  Fact Sheet for Healthcare Providers: https://hall.com/  This test is not yet approved or  cleared by the [place] FDA and has been authorized for detection and/or diagnosis of SARS-CoV-2 by FDA under an Emergency Use Authorization (EUA).  This EUA will remain in effect (meaning this test can be used) for  the duration of the COVID-19 declaration under Section 564(b)(1) of the Act, 21 U.S.C. section 360bbb-3(b)(1), unless the authorization is terminated or revoked sooner.  Performed at [facility], [place] [address] [place], [place] [address]   Blood culture (routine x 2)     Status: Abnormal   Collection Time: 04/19/22  1:11 PM   Specimen: BLOOD  Result Value Ref Range Status   Specimen Description BLOOD LEFT ANTECUBITAL  Final   Special Requests   Final    BOTTLES DRAWN AEROBIC AND ANAEROBIC Blood Culture adequate volume   Culture  Setup Time   Final    GRAM NEGATIVE RODS ANAEROBIC BOTTLE ONLY CRITICAL RESULT CALLED TO, READ BACK BY AND VERIFIED WITH: PHARMD MING P. [id number] @1708  FH    Culture (A)  Final    HAEMOPHILUS INFLUENZAE SUSCEPTIBILITIES PERFORMED ON PREVIOUS CULTURE WITHIN THE LAST 5 DAYS. SEE SEPARATE REPORT Performed at [facility], [place] [address] [place], [place] [address]    Report Status 04/25/2022 FINAL  Final  MRSA Next Gen by PCR, Nasal     Status: None   Collection Time: 04/19/22  1:24 PM   Specimen: Nasal Mucosa; Nasal Swab  Result Value Ref Range Status   MRSA by PCR Next Gen NOT DETECTED NOT DETECTED Final    Comment: (NOTE) The GeneXpert MRSA Assay (FDA approved for NASAL specimens only), is one component of a comprehensive MRSA colonization surveillance program. It is not intended to diagnose MRSA infection nor to guide or monitor treatment for MRSA infections. Test performance is not FDA approved in patients less than 6 years old. Performed at [facility], [place] [address] [place], [place] [address]   Blood culture (routine x 2)     Status: Abnormal (Preliminary result)   Collection Time: 04/19/22  1:25 PM   Specimen: BLOOD RIGHT FOREARM  Result Value Ref Range Status   Specimen Description BLOOD RIGHT FOREARM  Final   Special Requests   Final    BOTTLES DRAWN AEROBIC AND ANAEROBIC Blood Culture adequate volume   Culture   Setup Time   Final    GRAM NEGATIVE RODS AEROBIC BOTTLE ONLY CRITICAL RESULT CALLED TO, READ BACK BY AND VERIFIED WITH: PHARMD MING P. [id number] @1708  FH    Culture (  A)  Final    HAEMOPHILUS INFLUENZAE BETA LACTAMASE NEGATIVE HEALTH DEPARTMENT NOTIFIED REFERRED TO [facility] [place] IDENTIFICATION/CONFIRMATION Performed at [place] [address] [place], [place] [address]    Report Status PENDING  Incomplete  Blood Culture ID Panel (Reflexed)     Status: Abnormal   Collection Time: 04/19/22  1:25 PM  Result Value Ref Range Status   Enterococcus faecalis NOT DETECTED NOT DETECTED Final   Enterococcus Faecium NOT DETECTED NOT DETECTED Final   Listeria monocytogenes NOT DETECTED NOT DETECTED Final   Staphylococcus species NOT DETECTED NOT DETECTED Final   Staphylococcus aureus (BCID) NOT DETECTED NOT DETECTED Final   Staphylococcus epidermidis NOT DETECTED NOT DETECTED Final   Staphylococcus lugdunensis NOT DETECTED NOT DETECTED Final   Streptococcus species NOT DETECTED NOT DETECTED Final   Streptococcus agalactiae NOT DETECTED NOT DETECTED Final   Streptococcus pneumoniae NOT DETECTED NOT DETECTED Final   Streptococcus pyogenes NOT DETECTED NOT DETECTED Final   A.calcoaceticus-baumannii NOT DETECTED NOT DETECTED Final   Bacteroides fragilis NOT DETECTED NOT DETECTED Final   Enterobacterales NOT DETECTED NOT DETECTED Final   Enterobacter cloacae complex NOT DETECTED NOT DETECTED Final   Escherichia coli NOT DETECTED NOT DETECTED Final   Klebsiella aerogenes NOT DETECTED NOT DETECTED Final   Klebsiella oxytoca NOT DETECTED NOT DETECTED Final   Klebsiella pneumoniae NOT DETECTED NOT DETECTED Final   Proteus species NOT DETECTED NOT DETECTED Final   Salmonella species NOT DETECTED NOT DETECTED Final   Serratia marcescens NOT DETECTED NOT DETECTED Final   Haemophilus influenzae DETECTED (A) NOT DETECTED Final    Comment: CRITICAL  RESULT CALLED TO, READ BACK BY AND VERIFIED WITH: PHARMD MING P. [id number] @1708  FH    Neisseria meningitidis NOT DETECTED NOT DETECTED Final   Pseudomonas aeruginosa NOT DETECTED NOT DETECTED Final   Stenotrophomonas maltophilia NOT DETECTED NOT DETECTED Final   Candida albicans NOT DETECTED NOT DETECTED Final   Candida auris NOT DETECTED NOT DETECTED Final   Candida glabrata NOT DETECTED NOT DETECTED Final   Candida krusei NOT DETECTED NOT DETECTED Final   Candida parapsilosis NOT DETECTED NOT DETECTED Final   Candida tropicalis NOT DETECTED NOT DETECTED Final   Cryptococcus neoformans/gattii NOT DETECTED NOT DETECTED Final    Comment: Performed at [facility], [address] [place], [place] [address]  Culture, blood (Routine X 2) w Reflex to ID Panel     Status: None   Collection Time: 04/21/22  7:48 AM   Specimen: BLOOD  Result Value Ref Range Status   Specimen Description BLOOD RIGHT ANTECUBITAL  Final   Special Requests   Final    BOTTLES DRAWN AEROBIC AND ANAEROBIC Blood Culture adequate volume   Culture   Final    NO GROWTH 5 DAYS Performed at [facility], [place] [address], [place], [place] [address]    Report Status 04/26/2022 FINAL  Final  Culture, blood (Routine X 2) w Reflex to ID Panel     Status: None   Collection Time: 04/21/22  7:48 AM   Specimen: BLOOD RIGHT HAND  Result Value Ref Range Status   Specimen Description BLOOD RIGHT HAND  Final   Special Requests   Final    BOTTLES DRAWN AEROBIC AND ANAEROBIC Blood Culture adequate volume   Culture   Final    NO GROWTH 5 DAYS Performed at [facility], [place] [address] [place], [place] [address]  Report Status 04/26/2022 FINAL  Final     Labs:  CBC: Recent Labs  Lab 04/21/22 0748 04/22/22 0712 04/23/22 0252 04/24/22 0756 04/25/22 2109  WBC 7.9 8.7 8.7 8.3 9.3  NEUTROABS 5.4  --   --   --  6.5  HGB 9.0* 9.6* 9.2* 9.8* 9.8*  HCT 27.2* 28.2* 27.1* 28.4* 28.1*  MCV 103.8* 102.5* 100.7*  100.4* 100.7*  PLT 194 228 245 293 316   BMP &GFR Recent Labs  Lab 04/21/22 0748 04/22/22 0712 04/23/22 0252 04/24/22 0756 04/25/22 2109  NA 139 137 136 139 135  K 3.4* 3.9 3.7 3.8 4.4  CL 110 107 103 101 98  CO2 21* 25 26 30  32  GLUCOSE 101* 98 88 93 136*  BUN 22 15 12 11 21   CREATININE 1.05 0.92 0.87 1.03 1.17  CALCIUM 7.8* 8.3* 8.1* 8.5* 8.4*  MG 1.5* 1.9 1.7 1.6* 1.9   Estimated Creatinine Clearance: 30.7 mL/min (by C-G formula based on SCr of 1.17 mg/dL). Liver & Pancreas: Recent Labs  Lab 04/21/22 0748 04/25/22 2109  AST 32 45*  ALT 27 44  ALKPHOS 57 57  BILITOT 0.3 0.3  PROT 5.1* 5.5*  ALBUMIN 2.3* 2.1*   No results for input(s): "LIPASE", "AMYLASE" in the last 168 hours. No results for input(s): "AMMONIA" in the last 168 hours. Diabetic: No results for input(s): "HGBA1C" in the last 72 hours. No results for input(s): "GLUCAP" in the last 168 hours. Cardiac Enzymes: No results for input(s): "CKTOTAL", "CKMB", "CKMBINDEX", "TROPONINI" in the last 168 hours. No results for input(s): "PROBNP" in the last 8760 hours. Coagulation Profile: No results for input(s): "INR", "PROTIME" in the last 168 hours. Thyroid Function Tests: No results for input(s): "TSH", "T4TOTAL", "FREET4", "T3FREE", "THYROIDAB" in the last 72 hours. Lipid Profile: No results for input(s): "CHOL", "HDL", "LDLCALC", "TRIG", "CHOLHDL", "LDLDIRECT" in the last 72 hours. Anemia Panel: No results for input(s): "VITAMINB12", "FOLATE", "FERRITIN", "TIBC", "IRON", "RETICCTPCT" in the last 72 hours. Urine analysis:    Component Value Date/Time   COLORURINE YELLOW 04/19/2022 1409   APPEARANCEUR HAZY (A) 04/19/2022 1409   LABSPEC 1.016 04/19/2022 1409   PHURINE 5.0 04/19/2022 1409   GLUCOSEU NEGATIVE 04/19/2022 1409   HGBUR NEGATIVE 04/19/2022 1409   BILIRUBINUR NEGATIVE 04/19/2022 1409   KETONESUR NEGATIVE 04/19/2022 1409   PROTEINUR 100 (A) 04/19/2022 1409   UROBILINOGEN 0.2 11/13/2007 1049    NITRITE NEGATIVE 04/19/2022 1409   LEUKOCYTESUR NEGATIVE 04/19/2022 1409   Sepsis Labs: Invalid input(s): "PROCALCITONIN", "LACTICIDVEN"   SIGNED:  Mercy Riding, MD  [facility] 04/26/2022, 1:35 PM

## 2022-04-26 NOTE — Progress Notes (Signed)
Pt placed on overnight pulse oximetry per order on room air.

## 2022-04-26 NOTE — Care Management (Addendum)
04-26-22 1033 Case Manager received notification from the CSW that the patient will need oxygen for home. Case Manager spoke with the resident assistant and the NP at [facility] has asked for a nebulizer machine as well. Case Manager ordered both oxygen and nebulizer via [employer]. DME will be delivered to [address] [facility] [address]. [employer] has the information for delivery. CSW to make family aware of oxygen needs. No further needs identified at this time.   1308 04-26-22 Case Manager spoke with Provider and he will place orders for [facility] PT,OT,RN for this patient. [facility]- patient is currently active with services. Case Manager spoke with [facility].   1616 04-26-22 [employer] is unable to do the overnight pulse oximetry at the facility. Secure chat submitted to MD that the patient will need an order for an overnight pulse oximetry and the patient will have to transition to the facility on Thursday. No further needs identified at this time.

## 2022-04-26 NOTE — Progress Notes (Signed)
Patient discharged yesterday but stayed overnight due to some logistic issues.  No major events overnight other than brief tachycardia that has resolved.  It appears he has nocturnal desaturation requiring supplemental oxygen at night possibly from underlying sleep apnea.  Otherwise, remains stable.  Discharge summary from 04/25/2022 by Dr. Lynden Oxford remains in effect.  I have added oxygen 2 L by nasal cannula at night and nebulizer machine.

## 2022-04-27 DIAGNOSIS — N179 Acute kidney failure, unspecified: Secondary | ICD-10-CM | POA: Diagnosis not present

## 2022-04-27 DIAGNOSIS — J189 Pneumonia, unspecified organism: Secondary | ICD-10-CM | POA: Diagnosis not present

## 2022-04-27 DIAGNOSIS — J9601 Acute respiratory failure with hypoxia: Secondary | ICD-10-CM | POA: Diagnosis not present

## 2022-04-27 DIAGNOSIS — A413 Sepsis due to Hemophilus influenzae: Secondary | ICD-10-CM | POA: Diagnosis not present

## 2022-04-27 MED ORDER — DILTIAZEM HCL 30 MG PO TABS: 30.0000 mg | ORAL_TABLET | Freq: Four times a day (QID) | ORAL | 1 refills | Status: AC | PRN

## 2022-04-27 MED ORDER — NORTRIPTYLINE HCL 10 MG PO CAPS: 10.0000 mg | ORAL_CAPSULE | Freq: Every day | ORAL | 2 refills | Status: AC

## 2022-04-27 NOTE — TOC Transition Note (Signed)
Transition of Care [facility]) - CM/SW Discharge Note   Patient Details  Name: Ricky Sandoval MRN: [id number] Date of Birth: 1921/10/31  [facility] CM/SW Contact:  Delilah Shan, LCSWA Phone Number: 04/27/2022, 11:58 AM   Clinical Narrative:     Patient will DC to: [facility]    Anticipated DC date: 04/27/2022   Family notified: [name]    Will Transport by: Sharin Mons   ?   Per MD patient ready for DC to [facility] with palliative services to follow . RN, patient, patient's family, Maralyn Sago with [facility],and facility notified of DC. Discharge Summary,HH orders,FL2,sent to facility. RN given number for report tele#[phone] RM# [phone] . DC packet on chart. DNR signed by MD attached to patients DC packet.    CSW signing off  Final next level of care: Assisted Living Barriers to Discharge: No Barriers Identified   Patient Goals and CMS Choice   CMS Medicare.gov Compare Post Acute Care list provided to:: Patient Represenative (must comment) (Patients son [name]) Choice offered to / list presented to : Adult Children [name])  Discharge Placement              Patient chooses bed at: [facility] Patient to be transferred to facility by: [facility] Name of family member notified: [name] Patient and family notified of of transfer: 04/26/22  Discharge Plan and Services In-house Referral: Clinical Social Work                                   Social Determinants of Health (SDOH) Interventions     Readmission Risk Interventions     No data to display

## 2022-04-27 NOTE — Discharge Summary (Addendum)
Physician Discharge Summary  Ricky Sandoval [id number] DOB: 1922-02-18 DOA: 04/19/2022  PCP: [name], MD  Admit date: 04/19/2022 Discharge date: 04/27/2022 Admitted From: ALF Disposition: ALF  Recommendations for Outpatient Follow-up:  Follow up with PCP in 1 week Check BMP and CBC in 1 to 2 weeks Consider outpatient referral to sleep clinic to rule out sleep apnea Consider outpatient referral to gastroenterology for evaluation of esophageal dysmotility/stricture Recommend referral to palliative care outpatient Please follow up on the following pending results: None  Home Health: PT/OT/RN/SLP Equipment/Devices: 2 L oxygen by [place] at night  Discharge Condition: Stable but guarded prognosis CODE STATUS: DNR/DNI  Follow-up Information     [name], [name], MD. Schedule an appointment as soon as possible for a visit in 1 week(s).   Specialty: Internal Medicine Contact information: [place] [address] [phone]         [facility] Follow up.   Why: Oxygen and Nebulizer machine Contact information: [place] [address] [phone]         [facility] [place] Follow up.   Specialty: Home Health Services Why: Registered Nurse, Physical Therapy,Occupational Therapy-office to call with visit times. Contact information: [address] [place] [address] [phone]                 Hospital course 88 year old M with PMH of CKD-3A, or esophageal dysmotility/stricture, dysphagia, HTN and HLD admitted for severe sepsis due to left lower lobe pneumonia secondary to haemophilus influenza infection and bacteremia along acute respiratory failure with hypoxia and AKI.  Reportedly desaturated to 85% on room air requiring 2 L by nasal cannula to maintain appropriate saturation.  Patient was initially started on vancomycin and cefepime with improvement in his symptoms.  ID consulted and patient was  transition to p.o. amoxicillin and completed antibiotic course in house.  Speech therapy recommended dysphagia 3 diet.   Of note, patient's acute respiratory failure resolved but noted to have nocturnal desaturation to 85% on room air requiring 2 L to recover.  He is discharged on 2 L by nasal cannula to be worn at night.  See individual problem list below for more.   Problems addressed during this hospitalization Principal Problem:   Sepsis due to pneumonia [facility]) Active Problems:   Acute respiratory failure with hypoxia (HCC)   H. influenzae septicemia ([place])   Acute kidney injury superimposed on chronic kidney disease (HCC)   Narrow complex tachycardia (HCC)   Essential hypertension   Normocytic anemia   History of esophageal stricture   Severe sepsis due to pneumonia and H. influenzae bacteremia: POA Acute respiratory failure with hypoxia: POA.  Resolved except for nocturnal desaturation. -Blood culture with H. Influenzae.   -Completed antibiotic course shows ceftriaxone and vancomycin followed by amoxicillin -Repeat blood cultures negative. -Continue incentive spirometry and flutter valve -Speech therapy recommended dysphagia 3 diet.  Nocturnal hypoxia: Concern for sleep apnea. Patient desaturated to 85 % on room air at night while asleep.  Discharged on 2 L by [place] at night.   Dysphagia/history of esophageal dysmotility/esophageal stricture/esophageal ulcer X-ray esophagus shows evidence of stricture again at this time on 04/21/2022. although no definite fixed narrowing was appreciated at this site. -SLP recommended dysphagia 3 diet. -Continue Protonix -May consider outpatient follow-up with GI although I doubt candidacy for procedure given his age -[facility] SLP ordered.   Acute kidney injury superimposed on chronic kidney disease stage IIIa: AKI resolved. -Recheck in 1 to  2 weeks   Narrow complex tachycardia/elevated troponin: Elevated troponin likely demand ischemia. TTE with  preserved EF and no valvular abnormality or RWMA.  -Cardizem 30 mg every 6 hours as needed for sustained tachycardia greater than 120 -Discontinued metoprolol and amlodipine   Essential hypertension: Normotensive off home antihypertensive meds. -Cardiac meds as above   Macrocytic anemia/related B12 deficiency: H&H stable. -Continue B12 and folic acid supplementation -Continue feeding supplements.  BPH Continue Flomax   GERD Continue Protonix  Debility/physical deconditioning -HH PT/OT ordered.  Inadequate oral intake Nutrition Problem: Inadequate oral intake Etiology: acute illness Signs/Symptoms: meal completion < 50% Interventions: Ensure Enlive (each supplement provides 350kcal and 20 grams of protein), MVI     Vital signs Vitals:   04/26/22 2321 04/27/22 0035 04/27/22 0040 04/27/22 0450  BP:    (!) 107/49  Pulse: 80   66  Temp:    98.1 F (36.7 C)  Resp: 17   19  Height:      Weight:    67.9 kg  SpO2:  (!) 85% Comment: sustained 93% 95%  TempSrc:    Oral  BMI (Calculated):    24.93     Discharge exam  GENERAL: No apparent distress.  Nontoxic. HEENT: MMM.  Vision and hearing grossly intact.  NECK: Supple.  No apparent JVD.  RESP:  No IWOB.  Fair aeration bilaterally. CVS:  RRR. Heart sounds normal.  ABD/GI/GU: BS+. Abd soft, NTND.  MSK/EXT:  Moves extremities. No apparent deformity. No edema.  SKIN: no apparent skin lesion or wound NEURO: Awake and alert. Oriented fairly.  No apparent focal neuro deficit. PSYCH: Calm. Normal affect.   Discharge Instructions Discharge Instructions     DIET DYS 3   Complete by: As directed    Fluid consistency: Thin      Allergies as of 04/27/2022       Reactions   Zestril [lisinopril] Other (See Comments)   Airway constriction        Medication List     STOP taking these medications    amLODipine 2.5 MG tablet Commonly known as: NORVASC   Anti-Diarrheal 2 MG tablet Generic drug: loperamide    cetirizine 10 MG tablet Commonly known as: ZYRTEC   diphenhydramine-acetaminophen 25-500 MG Tabs tablet Commonly known as: TYLENOL PM   flunisolide 25 MCG/ACT (0.025%) Soln Commonly known as: NASALIDE   ketotifen 0.025 % ophthalmic solution Commonly known as: ZADITOR   metoprolol succinate 25 MG 24 hr tablet Commonly known as: TOPROL-XL   simvastatin 40 MG tablet Commonly known as: ZOCOR       TAKE these medications    acetaminophen 500 MG tablet Commonly known as: TYLENOL Take 1,000 mg by mouth 2 (two) times daily.   ascorbic acid 500 MG tablet Commonly known as: VITAMIN C Take 500 mg by mouth daily.   aspirin 81 MG tablet Take 81 mg by mouth daily.   carboxymethylcellulose 0.5 % Soln Commonly known as: REFRESH PLUS Place 2 drops into both eyes 2 (two) times daily.   cyanocobalamin 1000 MCG tablet Take 1 tablet (1,000 mcg total) by mouth daily.   diltiazem 30 MG tablet Commonly known as: Cardizem Take 1 tablet (30 mg total) by mouth every 6 (six) hours as needed (Persistent tachycardia > 120).   docusate sodium 100 MG capsule Commonly known as: COLACE Take 300 mg by mouth daily.   feeding supplement Liqd Take 237 mLs by mouth 2 (two) times daily between meals.   fluticasone 50 MCG/ACT nasal  spray Commonly known as: FLONASE Place 1 spray into both nostrils in the morning.   folic acid 1 MG tablet Commonly known as: FOLVITE Take 1 tablet (1 mg total) by mouth daily.   levothyroxine 75 MCG tablet Commonly known as: SYNTHROID Take 75 mcg by mouth daily before breakfast.   loratadine 10 MG tablet Commonly known as: CLARITIN Take 10 mg by mouth daily. For 7 days. Starting 04/14/22   MUCINEX CHILDRENS PO Take 20 mLs by mouth 2 (two) times daily. For 7 days. Starting 04/14/22   nortriptyline 10 MG capsule Commonly known as: Pamelor Take 1 capsule (10 mg total) by mouth at bedtime. What changed:  medication strength how much to take   OVER THE  COUNTER MEDICATION Apply 1 application  topically See admin instructions. [place] Protect 1%-12% cream. Apply to buttocks after each incontinent episode or urinating bed as needed.   pantoprazole 40 MG tablet Commonly known as: Protonix Take 1 tablet (40 mg total) by mouth 2 (two) times daily before a meal.   PreserVision AREDS 2 Chew Chew 2 tablets by mouth daily.   sucralfate 1 g tablet Commonly known as: CARAFATE Take 1 g by mouth 2 (two) times daily with a meal.   tamsulosin 0.4 MG Caps capsule Commonly known as: FLOMAX Take 0.4 mg by mouth at bedtime.   triamcinolone ointment 0.1 % Commonly known as: KENALOG Apply 1 Application topically 2 (two) times daily. To affected area on left side below left buttocks. For 14 days. Starting 04/06/22   Vitamin D-3 25 MCG (1000 UT) Caps Take 2,000 Units by mouth daily.               Durable Medical Equipment  (From admission, onward)           Start     Ordered   04/26/22 1034  For home use only DME oxygen  Once       Question Answer Comment  Length of Need 6 Months   Mode or (Route) Nasal cannula   Liters per Minute 2   Frequency Only at night (stationary unit needed)   Oxygen delivery system Gas      04/26/22 1033   04/26/22 1034  For home use only DME Nebulizer machine  Once       Question Answer Comment  Patient needs a nebulizer to treat with the following condition Shortness of breath   Length of Need 6 Months      04/26/22 1033            Consultations: Infectious disease  Procedures/Studies: None   DG Chest 1 View  Result Date: 04/25/2022 CLINICAL DATA:  [id number].  Respiratory failure. EXAM: CHEST  1 VIEW COMPARISON:  Chest x-ray 04/19/2022 FINDINGS: The heart and mediastinal contours are unchanged. Aortic calcification. Low lung volumes. Retrocardiac airspace opacity. No pulmonary edema. No definite pleural effusion. No pneumothorax. No acute osseous abnormality. PO contrast noted Partially opacifying  the bowel within the left upper abdomen. IMPRESSION: 1. Low lung volumes with retrocardiac airspace opacity that could represent a combination of infection/inflammation and/or atelectasis. 2.  Aortic Atherosclerosis (ICD10-I70.0). Electronically Signed   By: Iven Finn M.D.   On: 04/25/2022 20:54   DG Swallowing Func-Speech Pathology  Result Date: 04/25/2022 Table formatting from the original result was not included. Images from the original result were not included. Objective Swallowing Evaluation: Type of Study: MBS-Modified Barium Swallow Study  Patient Details Name: Ricky Sandoval MRN: [id number] Date of Birth: March 31, 1922  Today's Date: 04/25/2022 Time: SLP Start Time (ACUTE ONLY): 1420 -SLP Stop Time (ACUTE ONLY): 1445 SLP Time Calculation (min) (ACUTE ONLY): 25 min Past Medical History: Past Medical History: Diagnosis Date  Diabetes type 2, controlled ([place])   High cholesterol   Hypertension   Hypothyroid  Past Surgical History: Past Surgical History: Procedure Laterality Date  BALLOON DILATION N/A 01/30/2021  Procedure: BALLOON DILATION;  Surgeon: Irene Shipper, MD;  Location: [facility] ENDOSCOPY;  Service: Endoscopy;  Laterality: N/A;  BIOPSY  01/30/2021  Procedure: BIOPSY;  Surgeon: Irene Shipper, MD;  Location: [facility] ENDOSCOPY;  Service: Endoscopy;;  BIOPSY  02/01/2021  Procedure: BIOPSY;  Surgeon: Ladene Artist, MD;  Location: [facility] ENDOSCOPY;  Service: Endoscopy;;  BIOPSY  02/08/2021  Procedure: BIOPSY;  Surgeon: Thornton Park, MD;  Location: [facility] ENDOSCOPY;  Service: Gastroenterology;;  ESOPHAGOGASTRODUODENOSCOPY N/A 02/01/2021  Procedure: ESOPHAGOGASTRODUODENOSCOPY (EGD);  Surgeon: Ladene Artist, MD;  Location: [facility] ENDOSCOPY;  Service: Endoscopy;  Laterality: N/A;  ESOPHAGOGASTRODUODENOSCOPY (EGD) WITH PROPOFOL N/A 01/30/2021  Procedure: ESOPHAGOGASTRODUODENOSCOPY (EGD) WITH PROPOFOL;  Surgeon: Irene Shipper, MD;  Location: [facility] ENDOSCOPY;  Service: Endoscopy;  Laterality: N/A;  ESOPHAGOGASTRODUODENOSCOPY (EGD) WITH  PROPOFOL N/A 02/08/2021  Procedure: ESOPHAGOGASTRODUODENOSCOPY (EGD) WITH PROPOFOL;  Surgeon: Thornton Park, MD;  Location: [facility] ENDOSCOPY;  Service: Gastroenterology;  Laterality: N/A;  FOREIGN BODY REMOVAL  01/30/2021  Procedure: FOREIGN BODY REMOVAL;  Surgeon: Irene Shipper, MD;  Location: [place];  Service: Endoscopy;;  SAVORY DILATION N/A 02/01/2021  Procedure: Azzie Almas DILATION;  Surgeon: Ladene Artist, MD;  Location: [facility] ENDOSCOPY;  Service: Endoscopy;  Laterality: N/A; HPI: Ricky Sandoval is a 88 y.o. male with medical history significant of hypertension hyperlipidemia, diabetes mellitus type 2, esophageal dysphagia, esophageal ulcer, presbyesophagus, with esophageal dilation, and hypothyroidism who presented due to concern for cough and shortness of breath. CXR remarkable for "left lower lobe airspace opacity is noted concerning for pneumonia."  Per chart review,  esophagram 8/25 revealed marked intermittent esophageal dysmotility with tertiary contractions; hiatal hernia. A 13 mm barium tablet did not pass beyond the level of the midesophagus despite a prolonged period of observation. Findings raise concern for a mid esophageal stricture  Subjective: alert, participatory  Recommendations for follow up therapy are one component of a multi-disciplinary discharge planning process, led by the attending physician.  Recommendations may be updated based on patient status, additional functional criteria and insurance authorization. Assessment / Plan / Recommendation   04/25/2022   2:00 PM Clinical Impressions Clinical Impression Pt presents with quite functional oropharyngeal swallow marked by penetration of thin liquids (PAS 2 and 2) with no observed aspiration. There was good pharyngeal stripping and no residue post swallow. He consumed crackers, puree, and thin liquids.  He exhibited coughing throughout the study - this was not correlated with laryngeal penetration. His cough may be a response to the  esophageal dysfunction noted on 8/25 esophagram. Recommend continuing current diet of soft mechanical, thin liquids, and meds crushed/broken and given in puree. SLP Visit Diagnosis Dysphagia, pharyngoesophageal phase (R13.14) Impact on safety and function Mild aspiration risk     04/25/2022   2:00 PM Treatment Recommendations Treatment Recommendations No treatment recommended at this time     04/20/2022   9:00 AM Prognosis Prognosis for Safe Diet Advancement Good   04/25/2022   2:00 PM Diet Recommendations SLP Diet Recommendations Dysphagia 3 (Mech soft) solids;Thin liquid Liquid Administration via Cup;Straw Medication Administration Crushed with puree Compensations Minimize environmental distractions;Small sips/bites;Slow rate Postural Changes Remain semi-upright after after  feeds/meals (Comment)     04/25/2022   2:00 PM Other Recommendations Oral Care Recommendations Oral care BID Follow Up Recommendations Long-term institutional care without follow-up therapy Assistance recommended at discharge Intermittent Supervision/Assistance   04/20/2022   9:00 AM Frequency and Duration  Speech Therapy Frequency (ACUTE ONLY) min 2x/week Treatment Duration 2 weeks     04/25/2022   2:00 PM Oral Phase Oral Phase [facility]    04/25/2022   2:00 PM Pharyngeal Phase Pharyngeal Phase Impaired Pharyngeal- Thin Cup Penetration/Aspiration before swallow Pharyngeal Material enters airway, remains ABOVE vocal cords then ejected out;Material enters airway, remains ABOVE vocal cords and not ejected out Pharyngeal- Thin Straw Penetration/Aspiration before swallow Pharyngeal Material enters airway, remains ABOVE vocal cords then ejected out;Material enters airway, remains ABOVE vocal cords and not ejected out     No data to display    Juan Quam Laurice 04/25/2022, 3:41 PM                     ECHOCARDIOGRAM COMPLETE  Result Date: 04/21/2022    ECHOCARDIOGRAM REPORT   Patient Name:   Ricky Sandoval Date of Exam: 04/21/2022 Medical Rec #:   [id number]       Height:       65.0 in Accession #:    [id number]      Weight:       154.5 lb Date of Birth:  08-May-1922        BSA:          1.773 m Patient Age:    89 years        BP:           124/53 mmHg Patient Gender: M               HR:           72 bpm. Exam Location:  Inpatient Procedure: 2D Echo, Cardiac Doppler and Color Doppler Indications:    Abnormal ECG  History:        Patient has prior history of Echocardiogram examinations, most                 recent 07/04/2016. Risk Factors:Hypertension, Dyslipidemia and                 Diabetes.  Sonographer:    Memory Argue Referring Phys: [id number] RONDELL A SMITH IMPRESSIONS  1. Left ventricular ejection fraction, by estimation, is 60 to 65%. The left ventricle has normal function. The left ventricle has no regional wall motion abnormalities. There is severe concentric left ventricular hypertrophy. Left ventricular diastolic  parameters are consistent with Grade I diastolic dysfunction (impaired relaxation). Elevated left ventricular end-diastolic pressure.  2. Right ventricular systolic function is normal. The right ventricular size is normal.  3. Left atrial size was severely dilated.  4. Moderate pleural effusion in the left lateral region.  5. The mitral valve is normal in structure. Trivial mitral valve regurgitation. No evidence of mitral stenosis. Moderate mitral annular calcification.  6. The aortic valve is tricuspid. There is mild calcification of the aortic valve. There is mild thickening of the aortic valve. Aortic valve regurgitation is trivial. Aortic valve sclerosis is present, with no evidence of aortic valve stenosis. Aortic valve area, by VTI measures 2.28 cm. Aortic valve mean gradient measures 6.0 mmHg. Aortic valve Vmax measures 1.53 m/s.  7. Aortic dilatation noted. There is mild dilatation of the ascending aorta, measuring 39 mm.  8. The inferior vena cava is normal  in size with greater than 50% respiratory variability, suggesting right  atrial pressure of 3 mmHg. FINDINGS  Left Ventricle: Left ventricular ejection fraction, by estimation, is 60 to 65%. The left ventricle has normal function. The left ventricle has no regional wall motion abnormalities. The left ventricular internal cavity size was normal in size. There is  severe concentric left ventricular hypertrophy. Left ventricular diastolic parameters are consistent with Grade I diastolic dysfunction (impaired relaxation). Elevated left ventricular end-diastolic pressure. Right Ventricle: The right ventricular size is normal. No increase in right ventricular wall thickness. Right ventricular systolic function is normal. Left Atrium: Left atrial size was severely dilated. Right Atrium: Right atrial size was normal in size. Pericardium: There is no evidence of pericardial effusion. Mitral Valve: The mitral valve is normal in structure. Moderate mitral annular calcification. Trivial mitral valve regurgitation. No evidence of mitral valve stenosis. Tricuspid Valve: The tricuspid valve is normal in structure. Tricuspid valve regurgitation is not demonstrated. No evidence of tricuspid stenosis. Aortic Valve: The aortic valve is tricuspid. There is mild calcification of the aortic valve. There is mild thickening of the aortic valve. Aortic valve regurgitation is trivial. Aortic valve sclerosis is present, with no evidence of aortic valve stenosis. Aortic valve mean gradient measures 6.0 mmHg. Aortic valve peak gradient measures 9.4 mmHg. Aortic valve area, by VTI measures 2.28 cm. Pulmonic Valve: The pulmonic valve was normal in structure. Pulmonic valve regurgitation is not visualized. No evidence of pulmonic stenosis. Aorta: Aortic dilatation noted. There is mild dilatation of the ascending aorta, measuring 39 mm. Venous: The inferior vena cava is normal in size with greater than 50% respiratory variability, suggesting right atrial pressure of 3 mmHg. IAS/Shunts: No atrial level shunt detected by  color flow Doppler. Additional Comments: There is a moderate pleural effusion in the left lateral region.  LEFT VENTRICLE PLAX 2D LVIDd:         4.20 cm   Diastology LVIDs:         2.70 cm   LV e' medial:    4.79 cm/s LV PW:         1.70 cm   LV E/e' medial:  17.9 LV IVS:        1.70 cm   LV e' lateral:   6.69 cm/s LVOT diam:     2.20 cm   LV E/e' lateral: 12.8 LV SV:         86 LV SV Index:   49 LVOT Area:     3.80 cm  RIGHT VENTRICLE TAPSE (M-mode): 1.9 cm LEFT ATRIUM           Index LA diam:      4.00 cm 2.26 cm/m LA Vol (A2C): 67.9 ml 38.30 ml/m LA Vol (A4C): 70.9 ml 39.99 ml/m  AORTIC VALVE AV Area (Vmax):    2.02 cm AV Area (Vmean):   2.12 cm AV Area (VTI):     2.28 cm AV Vmax:           153.00 cm/s AV Vmean:          112.000 cm/s AV VTI:            0.379 m AV Peak Grad:      9.4 mmHg AV Mean Grad:      6.0 mmHg LVOT Vmax:         81.40 cm/s LVOT Vmean:        62.500 cm/s LVOT VTI:          0.227  m LVOT/AV VTI ratio: 0.60  AORTA Ao Root diam: 3.60 cm Ao Asc diam:  3.90 cm MITRAL VALVE MV Area (PHT): 3.34 cm     SHUNTS MV Decel Time: 227 msec     Systemic VTI:  0.23 m MR Peak grad: 43.8 mmHg     Systemic Diam: 2.20 cm MR Vmax:      331.00 cm/s MV E velocity: 85.90 cm/s MV A velocity: 101.00 cm/s MV E/A ratio:  0.85 Skeet Latch MD Electronically signed by Skeet Latch MD Signature Date/Time: 04/21/2022/11:49:44 AM    Final    DG ESOPHAGUS W SINGLE CM (SOL OR THIN BA)  Result Date: 04/21/2022 CLINICAL DATA:  88 year old male with a history of prior esophageal ulcer, prior esophageal stricture and prior esophageal dilatation presenting with ongoing solid-food dysphagia. EXAM: ESOPHAGUS/BARIUM SWALLOW/TABLET STUDY TECHNIQUE: A single contrast examination was performed using thin liquid barium. The patient also swallowed a 13 mm barium tablet under fluoroscopy. The exam was performed by Durenda Guthrie, PA-C, and was supervised and interpreted by Dr. Kellie Simmering FLUOROSCOPY: Fluoroscopy time: 2  minutes, 48 seconds (19.70 mGy). COMPARISON:  Esophagram 09/07/2006. FINDINGS: Limited examination due to the patient's inability to stand and limited ability to reposition on the fluoroscopy table. All imaging was acquired with the patient in the semi-recumbent position. Marked intermittent esophageal dysmotility with tertiary contractions. A swallowed 13 mm barium tablet did not pass beyond the level of the midesophagus despite a prolonged period of observation. Findings raise the possibility of a mid esophageal stricture, although no definite fixed narrowing was appreciated at this site (within described limitations). Elsewhere, normal caliber and smooth contour of the esophagus. Small hiatal hernia. No gastroesophageal reflux was observed. IMPRESSION: Limited examination due to the patient's inability to stand and limited ability to reposition on the fluoroscopy table. All imaging was acquired with the patient in the semi-recumbent position. Marked intermittent esophageal dysmotility with tertiary contractions. A swallowed 13 mm barium tablet did not pass beyond the level of the midesophagus despite a prolonged period of observation. Findings raise concern for a mid esophageal stricture, although no definite fixed narrowing was appreciated at this site. Consider endoscopy for further evaluation. Small hiatal hernia. Electronically Signed   By: Kellie Simmering D.O.   On: 04/21/2022 11:21   DG Chest 2 View  Result Date: 04/19/2022 CLINICAL DATA:  Cough. EXAM: CHEST - 2 VIEW COMPARISON:  January 30, 2021. FINDINGS: Stable cardiomediastinal silhouette. Right lung is clear. Left lower lobe airspace opacity is noted concerning for pneumonia. Bony thorax is unremarkable. IMPRESSION: Left lower lobe airspace opacity is noted concerning for pneumonia. Followup PA and lateral chest X-ray is recommended in 3-4 weeks following trial of antibiotic therapy to ensure resolution and exclude underlying malignancy. Electronically  Signed   By: Marijo Conception M.D.   On: 04/19/2022 12:55       The results of significant diagnostics from this hospitalization (including imaging, microbiology, ancillary and laboratory) are listed below for reference.     Microbiology: Recent Results (from the past 240 hour(s))  SARS Coronavirus 2 by RT PCR (hospital order, performed in [facility] hospital lab) *cepheid single result test* Anterior Nasal Swab     Status: None   Collection Time: 04/19/22 12:14 PM   Specimen: Anterior Nasal Swab  Result Value Ref Range Status   SARS Coronavirus 2 by RT PCR NEGATIVE NEGATIVE Final    Comment: (NOTE) SARS-CoV-2 target nucleic acids are NOT DETECTED.  The SARS-CoV-2 RNA is generally detectable  in upper and lower respiratory specimens during the acute phase of infection. The lowest concentration of SARS-CoV-2 viral copies this assay can detect is 250 copies / mL. A negative result does not preclude SARS-CoV-2 infection and should not be used as the sole basis for treatment or other patient management decisions.  A negative result may occur with improper specimen collection / handling, submission of specimen other than nasopharyngeal swab, presence of viral mutation(s) within the areas targeted by this assay, and inadequate number of viral copies (<250 copies / mL). A negative result must be combined with clinical observations, patient history, and epidemiological information.  Fact Sheet for Patients:   https://www.patel.info/  Fact Sheet for Healthcare Providers: https://hall.com/  This test is not yet approved or  cleared by the [place] FDA and has been authorized for detection and/or diagnosis of SARS-CoV-2 by FDA under an Emergency Use Authorization (EUA).  This EUA will remain in effect (meaning this test can be used) for the duration of the COVID-19 declaration under Section 564(b)(1) of the Act, 21 U.S.C. section  360bbb-3(b)(1), unless the authorization is terminated or revoked sooner.  Performed at [facility], [place] [address], [place], [place] [address]   Blood culture (routine x 2)     Status: Abnormal   Collection Time: 04/19/22  1:11 PM   Specimen: BLOOD  Result Value Ref Range Status   Specimen Description BLOOD LEFT ANTECUBITAL  Final   Special Requests   Final    BOTTLES DRAWN AEROBIC AND ANAEROBIC Blood Culture adequate volume   Culture  Setup Time   Final    GRAM NEGATIVE RODS ANAEROBIC BOTTLE ONLY CRITICAL RESULT CALLED TO, READ BACK BY AND VERIFIED WITH: PHARMD MING P. [id number] @1708  FH    Culture (A)  Final    HAEMOPHILUS INFLUENZAE SUSCEPTIBILITIES PERFORMED ON PREVIOUS CULTURE WITHIN THE LAST 5 DAYS. SEE SEPARATE REPORT Performed at [facility], [place] [address] [place], [place] [address]    Report Status 04/25/2022 FINAL  Final  MRSA Next Gen by PCR, Nasal     Status: None   Collection Time: 04/19/22  1:24 PM   Specimen: Nasal Mucosa; Nasal Swab  Result Value Ref Range Status   MRSA by PCR Next Gen NOT DETECTED NOT DETECTED Final    Comment: (NOTE) The GeneXpert MRSA Assay (FDA approved for NASAL specimens only), is one component of a comprehensive MRSA colonization surveillance program. It is not intended to diagnose MRSA infection nor to guide or monitor treatment for MRSA infections. Test performance is not FDA approved in patients less than 59 years old. Performed at [facility], [place] [address] [place], [place] [address]   Blood culture (routine x 2)     Status: Abnormal (Preliminary result)   Collection Time: 04/19/22  1:25 PM   Specimen: BLOOD RIGHT FOREARM  Result Value Ref Range Status   Specimen Description BLOOD RIGHT FOREARM  Final   Special Requests   Final    BOTTLES DRAWN AEROBIC AND ANAEROBIC Blood Culture adequate volume   Culture  Setup Time   Final    GRAM NEGATIVE RODS AEROBIC BOTTLE ONLY CRITICAL RESULT CALLED TO, READ  BACK BY AND VERIFIED WITH: PHARMD MING P. [id number] @1708  FH    Culture (A)  Final    HAEMOPHILUS INFLUENZAE BETA LACTAMASE NEGATIVE HEALTH DEPARTMENT NOTIFIED REFERRED TO [place] IN [place] IDENTIFICATION/CONFIRMATION Performed at [facility], [place],  [place] [address]    Report Status PENDING  Incomplete  Blood Culture ID Panel (Reflexed)     Status: Abnormal   Collection Time: 04/19/22  1:25 PM  Result Value Ref Range Status   Enterococcus faecalis NOT DETECTED NOT DETECTED Final   Enterococcus Faecium NOT DETECTED NOT DETECTED Final   Listeria monocytogenes NOT DETECTED NOT DETECTED Final   Staphylococcus species NOT DETECTED NOT DETECTED Final   Staphylococcus aureus (BCID) NOT DETECTED NOT DETECTED Final   Staphylococcus epidermidis NOT DETECTED NOT DETECTED Final   Staphylococcus lugdunensis NOT DETECTED NOT DETECTED Final   Streptococcus species NOT DETECTED NOT DETECTED Final   Streptococcus agalactiae NOT DETECTED NOT DETECTED Final   Streptococcus pneumoniae NOT DETECTED NOT DETECTED Final   Streptococcus pyogenes NOT DETECTED NOT DETECTED Final   A.calcoaceticus-baumannii NOT DETECTED NOT DETECTED Final   Bacteroides fragilis NOT DETECTED NOT DETECTED Final   Enterobacterales NOT DETECTED NOT DETECTED Final   Enterobacter cloacae complex NOT DETECTED NOT DETECTED Final   Escherichia coli NOT DETECTED NOT DETECTED Final   Klebsiella aerogenes NOT DETECTED NOT DETECTED Final   Klebsiella oxytoca NOT DETECTED NOT DETECTED Final   Klebsiella pneumoniae NOT DETECTED NOT DETECTED Final   Proteus species NOT DETECTED NOT DETECTED Final   Salmonella species NOT DETECTED NOT DETECTED Final   Serratia marcescens NOT DETECTED NOT DETECTED Final   Haemophilus influenzae DETECTED (A) NOT DETECTED Final    Comment: CRITICAL RESULT CALLED TO, READ BACK BY AND VERIFIED WITH: PHARMD MING P. [id number] @1708  FH     Neisseria meningitidis NOT DETECTED NOT DETECTED Final   Pseudomonas aeruginosa NOT DETECTED NOT DETECTED Final   Stenotrophomonas maltophilia NOT DETECTED NOT DETECTED Final   Candida albicans NOT DETECTED NOT DETECTED Final   Candida auris NOT DETECTED NOT DETECTED Final   Candida glabrata NOT DETECTED NOT DETECTED Final   Candida krusei NOT DETECTED NOT DETECTED Final   Candida parapsilosis NOT DETECTED NOT DETECTED Final   Candida tropicalis NOT DETECTED NOT DETECTED Final   Cryptococcus neoformans/gattii NOT DETECTED NOT DETECTED Final    Comment: Performed at [facility], [address] [place], [place]  Culture, blood (Routine X 2) w Reflex to ID Panel     Status: None   Collection Time: 04/21/22  7:48 AM   Specimen: BLOOD  Result Value Ref Range Status   Specimen Description BLOOD RIGHT ANTECUBITAL  Final   Special Requests   Final    BOTTLES DRAWN AEROBIC AND ANAEROBIC Blood Culture adequate volume   Culture   Final    NO GROWTH 5 DAYS Performed at [facility], [address] [place], [place]    Report Status 04/26/2022 FINAL  Final  Culture, blood (Routine X 2) w Reflex to ID Panel     Status: None   Collection Time: 04/21/22  7:48 AM   Specimen: BLOOD RIGHT HAND  Result Value Ref Range Status   Specimen Description BLOOD RIGHT HAND  Final   Special Requests   Final    BOTTLES DRAWN AEROBIC AND ANAEROBIC Blood Culture adequate volume   Culture   Final    NO GROWTH 5 DAYS Performed at [facility], [address], [place], [place]    Report Status 04/26/2022 FINAL  Final     Labs:  CBC: Recent Labs  Lab 04/21/22 0748 04/22/22 0712 04/23/22 0252 04/24/22 0756 04/25/22 2109  WBC 7.9 8.7 8.7 8.3 9.3  NEUTROABS  5.4  --   --   --  6.5  HGB 9.0* 9.6* 9.2* 9.8* 9.8*  HCT 27.2* 28.2* 27.1* 28.4* 28.1*  MCV 103.8* 102.5* 100.7* 100.4* 100.7*  PLT 194 228 245 293 316   BMP &GFR Recent Labs  Lab 04/21/22 0748  04/22/22 0712 04/23/22 0252 04/24/22 0756 04/25/22 2109  NA 139 137 136 139 135  K 3.4* 3.9 3.7 3.8 4.4  CL 110 107 103 101 98  CO2 21* 25 26 30  32  GLUCOSE 101* 98 88 93 136*  BUN 22 15 12 11 21   CREATININE 1.05 0.92 0.87 1.03 1.17  CALCIUM 7.8* 8.3* 8.1* 8.5* 8.4*  MG 1.5* 1.9 1.7 1.6* 1.9   Estimated Creatinine Clearance: 30.7 mL/min (by C-G formula based on SCr of 1.17 mg/dL). Liver & Pancreas: Recent Labs  Lab 04/21/22 0748 04/25/22 2109  AST 32 45*  ALT 27 44  ALKPHOS 57 57  BILITOT 0.3 0.3  PROT 5.1* 5.5*  ALBUMIN 2.3* 2.1*   No results for input(s): "LIPASE", "AMYLASE" in the last 168 hours. No results for input(s): "AMMONIA" in the last 168 hours. Diabetic: No results for input(s): "HGBA1C" in the last 72 hours. No results for input(s): "GLUCAP" in the last 168 hours. Cardiac Enzymes: No results for input(s): "CKTOTAL", "CKMB", "CKMBINDEX", "TROPONINI" in the last 168 hours. No results for input(s): "PROBNP" in the last 8760 hours. Coagulation Profile: No results for input(s): "INR", "PROTIME" in the last 168 hours. Thyroid Function Tests: No results for input(s): "TSH", "T4TOTAL", "FREET4", "T3FREE", "THYROIDAB" in the last 72 hours. Lipid Profile: No results for input(s): "CHOL", "HDL", "LDLCALC", "TRIG", "CHOLHDL", "LDLDIRECT" in the last 72 hours. Anemia Panel: No results for input(s): "VITAMINB12", "FOLATE", "FERRITIN", "TIBC", "IRON", "RETICCTPCT" in the last 72 hours. Urine analysis:    Component Value Date/Time   COLORURINE YELLOW 04/19/2022 1409   APPEARANCEUR HAZY (A) 04/19/2022 1409   LABSPEC 1.016 04/19/2022 1409   PHURINE 5.0 04/19/2022 1409   GLUCOSEU NEGATIVE 04/19/2022 1409   HGBUR NEGATIVE 04/19/2022 1409   BILIRUBINUR NEGATIVE 04/19/2022 1409   KETONESUR NEGATIVE 04/19/2022 1409   PROTEINUR 100 (A) 04/19/2022 1409   UROBILINOGEN 0.2 11/13/2007 1049   NITRITE NEGATIVE 04/19/2022 1409   LEUKOCYTESUR NEGATIVE 04/19/2022 1409   Sepsis  Labs: Invalid input(s): "PROCALCITONIN", "LACTICIDVEN"   SIGNED:  Mercy Riding, MD  [facility] 04/27/2022, 8:23 AM

## 2022-04-27 NOTE — Care Management (Signed)
1042 04-27-22 Case Manager spoke with [employer] and the patient has qualified for oxygen. [facility] will deliver to the ALF shortly. MD. Staff RN, and CSW aware of plan of care. CSW to call [facility] for transport to the ALF. No further needs identified at this time.

## 2022-04-27 NOTE — Progress Notes (Signed)
Called report to [facility]. Spoke with [place]. Attempted to review discharge summary but was told she would review when he arrives. All questions were answered.

## 2022-04-27 NOTE — Plan of Care (Signed)
  Problem: Activity: Goal: Ability to tolerate increased activity will improve Outcome: Progressing   Problem: Clinical Measurements: Goal: Ability to maintain a body temperature in the normal range will improve Outcome: Progressing   Problem: Respiratory: Goal: Ability to maintain a clear airway will improve Outcome: Progressing   Problem: Health Behavior/Discharge Planning: Goal: Ability to manage health-related needs will improve Outcome: Progressing   Problem: Safety: Goal: Ability to remain free from injury will improve Outcome: Progressing   Problem: Skin Integrity: Goal: Risk for impaired skin integrity will decrease Outcome: Progressing   Problem: Clinical Measurements: Goal: Cardiovascular complication will be avoided Outcome: Progressing

## 2022-04-27 NOTE — Progress Notes (Addendum)
Sp02 = 85%, sustained, on room air, asleep. Hooked to [facility] at 2 lpm. Sp02 = 93%. Plan of care ongoing.

## 2022-05-05 ENCOUNTER — Non-Acute Institutional Stay: Payer: Medicare Other

## 2022-05-05 DIAGNOSIS — Z515 Encounter for palliative care: Secondary | ICD-10-CM

## 2022-05-09 LAB — CULTURE, BLOOD (ROUTINE X 2): Special Requests: ADEQUATE

## 2022-05-09 NOTE — Progress Notes (Addendum)
COMMUNITY PALLIATIVE CARE SW NOTE  PATIENT NAME: Ricky Sandoval DOB: 1921/10/27 MRN: [id number]  PRIMARY CARE PROVIDER: Chilton Greathouse, MD  RESPONSIBLE PARTY:  Acct ID - Guarantor Home Phone Work Phone Relationship Acct Type  [id number] JOSHUA, SOULIER* [phone]  Self P/F     [address], [place], [place] [address]   SOCIAL WORK VISIT  PC SW completed an initial visit with patient at [facility]. Patient was found in his room sitting in his wheelchair, awake and alert. He is hard of hearing, but was able to engage in dialogue. SW provided introduction and education to SW and palliative care program. Patient provided verbal consent to services. He provided a status update on himself. He report that he is widowed. He has three children. He is retired. He served in [employer] during [employer]. He is currently wheelchair bound and requires assistance with personal care needs. He is able to feed himself. He is toileted by staff. SW consulted with the facility nurse-Jennifer who advised that patient has declined since his hospitalization. He is having difficulty swallowing and is on a mechanical soft diet.   CODE STATUS: DNR ADVANCED DIRECTIVES: Yes MOST FORM COMPLETE:  Yes HOSPICE EDUCATION PROVIDED: No  Duration of visit and documentation: 60 minutes.   Fredricka Kohrs. Shanice Poznanski, LCSW

## 2022-05-11 LAB — MISCELLANEOUS TEST

## 2022-07-17 ENCOUNTER — Telehealth: Payer: Self-pay | Admitting: Physical Medicine and Rehabilitation

## 2022-07-17 NOTE — Telephone Encounter (Signed)
Patient son called in patient needs injections in hip, back and shoulder please advise,

## 2022-07-17 NOTE — Telephone Encounter (Signed)
scheduled

## 2022-07-31 ENCOUNTER — Non-Acute Institutional Stay: Payer: Medicare Other | Admitting: Hospice

## 2022-07-31 DIAGNOSIS — R531 Weakness: Secondary | ICD-10-CM

## 2022-07-31 DIAGNOSIS — K5901 Slow transit constipation: Secondary | ICD-10-CM

## 2022-07-31 DIAGNOSIS — R1319 Other dysphagia: Secondary | ICD-10-CM

## 2022-07-31 DIAGNOSIS — M25551 Pain in right hip: Secondary | ICD-10-CM

## 2022-07-31 DIAGNOSIS — E039 Hypothyroidism, unspecified: Secondary | ICD-10-CM

## 2022-07-31 DIAGNOSIS — Z515 Encounter for palliative care: Secondary | ICD-10-CM

## 2022-07-31 NOTE — Progress Notes (Signed)
[facility] Consult Note Telephone: [phone]  Fax: [phone]  PATIENT NAME: Ricky Sandoval [place] [address] [phone] (home)  DOB: Mar 02, 1922 MRN: [id number]  PRIMARY CARE PROVIDER:    [facility]  REFERRING PROVIDER:   [facility]  RESPONSIBLE PARTY:   Self Contact Information     Name Relation Home Work Mobile   [place] Son [phone]     Carol Ada Daughter [phone]          I met face to face with patient at facility. Palliative Care was asked to follow this patient  to address advance care planning, complex medical decision making and goals of care clarification.     ASSESSMENT AND / RECOMMENDATIONS:   CODE STATUS: Patient is a Do Not Resuscitate  Goals of Care: Goals include to maximize quality of life and symptom management  Symptom Management/Plan: Dysphagia: ST consult as needed.  Continue mechanical soft diet. Observed during meals, he fed self and swallowed without diffulty. Continue Carafate, Protonix as ordered.  Aspiration precautions.  Outpatient follow-up with GI as planned.  Hypothyroidism: Continue Levothyroxine as ordered. Last TSH in June 2023 is 0.88. Continue to monitor TSH routinely and as needed.  Right hip pain:Managed with Tylenol 56m BID. Effective.  Constipation: Continue Docusate sodium as ordered.  Weakness:Continue PT/OT for strengthening, ambulation.  Fall precautions.   Follow up: Palliative care will continue to follow for complex medical decision making, advance care planning, and clarification of goals. Return 6 weeks or prn.Encouraged to call provider sooner with any concerns.   Family /Caregiver/Community Supports: Patient in AL for ongoing care  HOSPICE ELIGIBILITY/DIAGNOSIS: TBD  Chief Complaint: Follow up visit.   HISTORY OF PRESENT ILLNESS:  MKINDRICK LANKFORDis a 88y.o. year old male  with multiple medical conditions requiring close  monitoring/management with high risk of complications and morbidity: Esophageal dysphagia, hypertension, depression, hypothyroidism, Right hip pain.  History obtained from review of EMR, discussion with primary team, caregiver, family and/or Mr. MKleve  Review and summarization of Epic records shows history from other than patient. Rest of 10 point ROS asked and negative. Independent review and interpretation of available lab tests/diagnostics     PAST MEDICAL HISTORY:  Active Ambulatory Problems    Diagnosis Date Noted   Duodenal erosion    Esophageal stricture    Dysphagia    Duodenitis with bleeding    Ulcer of esophagus without bleeding    Gastritis and gastroduodenitis    Sepsis due to pneumonia ([facility] 04/19/2022   [place] complex tachycardia 04/19/2022   Acute respiratory failure with hypoxia ([facility] 04/19/2022   Acute kidney injury superimposed on chronic kidney disease ([facility] 04/19/2022   Essential hypertension 04/19/2022   Normocytic anemia 04/19/2022   History of esophageal stricture 04/19/2022   H. influenzae septicemia ([facility] 04/20/2022   Resolved Ambulatory Problems    Diagnosis Date Noted   Food impaction of esophagus    Past Medical History:  Diagnosis Date   Diabetes type 2, controlled ([facility]    High cholesterol    Hypertension    Hypothyroid     SOCIAL HX:  Social History   Tobacco Use   Smoking status: Never   Smokeless tobacco: Never  Substance Use Topics   Alcohol use: Not Currently     FAMILY HX: No family history on file.    ALLERGIES:  Allergies  Allergen Reactions   Zestril [Lisinopril] Other (See Comments)    Airway constriction  PERTINENT MEDICATIONS:  Outpatient Encounter Medications as of 07/31/2022  Medication Sig   acetaminophen (TYLENOL) 500 MG tablet Take 1,000 mg by mouth 2 (two) times daily.   aspirin 81 MG tablet Take 81 mg by mouth daily.   carboxymethylcellulose (REFRESH PLUS) 0.5 % SOLN Place 2 drops into both eyes 2 (two)  times daily.   Cholecalciferol (VITAMIN D-3) 25 MCG (1000 UT) CAPS Take 2,000 Units by mouth daily.   cyanocobalamin 1000 MCG tablet Take 1 tablet (1,000 mcg total) by mouth daily.   diltiazem (CARDIZEM) 30 MG tablet Take 1 tablet (30 mg total) by mouth every 6 (six) hours as needed (Persistent tachycardia > 120).   docusate sodium (COLACE) 100 MG capsule Take 300 mg by mouth daily.   feeding supplement (ENSURE ENLIVE / ENSURE PLUS) LIQD Take 237 mLs by mouth 2 (two) times daily between meals.   fluticasone (FLONASE) 50 MCG/ACT nasal spray Place 1 spray into both nostrils in the morning.   folic acid (FOLVITE) 1 MG tablet Take 1 tablet (1 mg total) by mouth daily.   guaiFENesin (MUCINEX CHILDRENS PO) Take 20 mLs by mouth 2 (two) times daily. For 7 days. Starting 04/14/22   levothyroxine (SYNTHROID, LEVOTHROID) 75 MCG tablet Take 75 mcg by mouth daily before breakfast.   loratadine (CLARITIN) 10 MG tablet Take 10 mg by mouth daily. For 7 days. Starting 04/14/22   Multiple Vitamins-Minerals (PRESERVISION AREDS 2) CHEW Chew 2 tablets by mouth daily.   nortriptyline (PAMELOR) 10 MG capsule Take 1 capsule (10 mg total) by mouth at bedtime.   OVER THE COUNTER MEDICATION Apply 1 application  topically See admin instructions. Baza Protect 1%-12% cream. Apply to buttocks after each incontinent episode or urinating bed as needed.   pantoprazole (PROTONIX) 40 MG tablet Take 1 tablet (40 mg total) by mouth 2 (two) times daily before a meal.   sucralfate (CARAFATE) 1 g tablet Take 1 g by mouth 2 (two) times daily with a meal.   tamsulosin (FLOMAX) 0.4 MG CAPS capsule Take 0.4 mg by mouth at bedtime.   triamcinolone ointment (KENALOG) 0.1 % Apply 1 Application topically 2 (two) times daily. To affected area on left side below left buttocks. For 14 days. Starting 04/06/22   vitamin C (ASCORBIC ACID) 500 MG tablet Take 500 mg by mouth daily.   No facility-administered encounter medications on file as of 07/31/2022.    I spent 45 minutes providing this consultation; this includes time spent with patient/family, chart review and documentation. More than 50% of the time in this consultation was spent on counseling and coordinating communication   Thank you for the opportunity to participate in the care of Mr. Golda.  The palliative care team will continue to follow. Please call our office at [phone] if we can be of additional assistance.   Note: Portions of this note were generated with L[employer]ist. Dictation errors may occur despite best attempts at proofreading.  Teodoro Spray, NP

## 2022-08-03 ENCOUNTER — Ambulatory Visit: Payer: Self-pay

## 2022-08-03 ENCOUNTER — Ambulatory Visit (INDEPENDENT_AMBULATORY_CARE_PROVIDER_SITE_OTHER): Payer: Medicare Other | Admitting: Physical Medicine and Rehabilitation

## 2022-08-03 DIAGNOSIS — M25511 Pain in right shoulder: Secondary | ICD-10-CM | POA: Diagnosis not present

## 2022-08-03 DIAGNOSIS — G8929 Other chronic pain: Secondary | ICD-10-CM

## 2022-08-03 DIAGNOSIS — M25551 Pain in right hip: Secondary | ICD-10-CM | POA: Diagnosis not present

## 2022-08-03 NOTE — Progress Notes (Signed)
Ricky Sandoval - 88 y.o. male MRN [id number]  Date of birth: 06-Mar-1922  Office Visit Note: Visit Date: 08/03/2022 PCP: [name], MD Referred by: [name], MD  Subjective: Chief Complaint  Patient presents with   Right Shoulder - Pain   Right Hip - Pain   HPI: Ricky Sandoval is a 88 y.o. male who comes in today for evaluation and management at the request of Dr. Suella Broad for possible lumbar epidural injection versus repeat intra-articular hip injection and possible right glenohumeral joint injection.  Patient does reside in a nursing facility.  Since I have seen him last he has been hospitalized for community-acquired pneumonia.  Does.  He has palliative care consultation as well.  He formally is followed by Dr. Suella Broad from a nonoperative spine and pain standpoint.  He does have some lumbar spine degenerative changes but most of his symptoms seem to be hip related.  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  Speaking with him today it is low back and hip and groin.  It is worse when I move it today and rotation.  The last time I saw him this was a very similar situation where he does complain of back and hip pain but seems to be mostly hip related.  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  He complains today of pain with movement of the shoulder some pain down to the elbow down to the hands.  No paresthesias numbness or tingling.  He is having more difficulty with movement and ambulation.  Requires quite a bit of effort.  He is in a wheelchair today.  He has had no recent falls.      Review of Systems  Musculoskeletal:  Positive for back pain and joint pain.  All other systems reviewed and are negative.  Otherwise per HPI.  Assessment & Plan: Visit Diagnoses:    ICD-10-CM   1. Pain in right hip  M25.551 XR C-ARM NO REPORT    Large Joint Inj: R hip joint    2. Chronic right shoulder pain  M25.511 XR C-ARM NO REPORT    G89.29 Large Joint Inj: R glenohumeral       Plan: Findings:  1.  Chronic worsening severe low back and right hip and groin pain.  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  Patient obviously not a surgical candidate with his advanced age and conditioning.  He does suffer from deconditioning as well as decreased mobility.  Today elected to repeat right intra-articular hip injection.  Will continue to follow with Dr. Nelva Bush.  2.  Right shoulder pain does seem to be mechanical.  Worse with movement today.  Very tender to any motion at all.  Does not appear radicular.  Patient has had no prior history of cervical spine injections.  He does have some decreased motion of the cervical spine with forward flexion as well as extension.    Meds & Orders: No orders of the defined types were placed in this encounter.   Orders Placed This Encounter  Procedures   Large Joint Inj: R hip joint   Large Joint Inj: R glenohumeral   XR C-ARM NO REPORT    Follow-up: No follow-ups on file.   Procedures: Large Joint Inj: R hip joint on 08/03/2022 2:12 PM Indications: diagnostic evaluation and pain Details: 22 G 3.5 in needle, fluoroscopy-guided anterior approach  Arthrogram:  No  Medications: 4 mL bupivacaine 0.25 %; 40 mg triamcinolone acetonide 40 MG/ML Outcome: tolerated well, no immediate complications  There was excellent flow of contrast producing a partial arthrogram of the hip. The patient did have relief of symptoms during the anesthetic phase of the injection. Procedure, treatment alternatives, risks and benefits explained, specific risks discussed. Consent was given by the patient. Immediately prior to procedure a time out was called to verify the correct patient, procedure, equipment, support staff and site/side marked as required. Patient was prepped and draped in the usual sterile fashion.    Large Joint Inj: R  glenohumeral on 08/03/2022 2:13 PM Indications: pain and diagnostic evaluation Details: 22 G 3.5 in needle, fluoroscopy-guided anteromedial approach  Arthrogram: No  Medications: 40 mg triamcinolone acetonide 40 MG/ML; 5 mL bupivacaine 0.25 % Outcome: tolerated well, no immediate complications  There was excellent flow of contrast producing a partial arthrogram of the glenohumeral joint. The patient did have relief of symptoms during the anesthetic phase of the injection. Procedure, treatment alternatives, risks and benefits explained, specific risks discussed. Consent was given by the patient. Immediately prior to procedure a time out was called to verify the correct patient, procedure, equipment, support staff and site/side marked as required. Patient was prepped and draped in the usual sterile fashion.          Clinical History: No specialty comments available.   He reports that he has never smoked. He has never used smokeless tobacco. No results for input(s): "HGBA1C", "LABURIC" in the last 8760 hours.  Objective:  VS:  HT:    WT:   BMI:     BP:   HR: bpm  TEMP: ( )  RESP:  Physical Exam Vitals and nursing note reviewed.  Constitutional:      General: He is not in acute distress.    Appearance: Normal appearance. He is well-developed.  HENT:     Head: Normocephalic and atraumatic.     Nose: Nose normal.     Mouth/Throat:     Mouth: Mucous membranes are dry.  Eyes:     Conjunctiva/sclera: Conjunctivae normal.     Pupils: Pupils are equal, round, and reactive to light.  Cardiovascular:     Rate and Rhythm: Normal rate.     Pulses: Normal pulses.     Heart sounds: Normal heart sounds.  Pulmonary:     Effort: Pulmonary effort is normal. No respiratory distress.  Abdominal:     General: There is no distension.     Tenderness: There is no abdominal tenderness. There is no guarding.  Musculoskeletal:        General: Tenderness present.     Cervical back: Normal range  of motion and neck supple. No rigidity.     Right lower leg: No edema.     Left lower leg: No edema.     Comments: Patient is in wheelchair.  He has difficulty standing.  No focal weakness.  Clear pain with hip rotation on the right that does reproduce most of his pain.  He has good distal strength.  No clonus.  He has pain with rotation of the right shoulder and left shoulder.  Skin:    General: Skin is warm and dry.     Findings: No erythema or rash.  Neurological:     General: No focal deficit present.     Mental Status: He is alert and oriented to person, place, and time. Mental status is at baseline.  Sensory: No sensory deficit.     Motor: Weakness present.     Coordination: Coordination normal.     Gait: Gait abnormal.  Psychiatric:        Mood and Affect: Mood normal.        Behavior: Behavior normal.     Ortho Exam  Imaging: No results found.  Past Medical/Family/Surgical/Social History: Medications & Allergies reviewed per EMR, new medications updated. Patient Active Problem List   Diagnosis Date Noted   H. influenzae septicemia ([place]) 04/20/2022   Sepsis due to pneumonia ([place]) 04/19/2022   Narrow complex tachycardia 04/19/2022   Acute respiratory failure with hypoxia ([place]) 04/19/2022   Acute kidney injury superimposed on chronic kidney disease ([place]) 04/19/2022   Essential hypertension 04/19/2022   Normocytic anemia 04/19/2022   History of esophageal stricture 04/19/2022   Ulcer of esophagus without bleeding    Gastritis and gastroduodenitis    Dysphagia    Duodenitis with bleeding    Duodenal erosion    Esophageal stricture    Past Medical History:  Diagnosis Date   Diabetes type 2, controlled ([place])    High cholesterol    Hypertension    Hypothyroid    No family history on file. Past Surgical History:  Procedure Laterality Date   BALLOON DILATION N/A 01/30/2021   Procedure: BALLOON DILATION;  Surgeon: Irene Shipper, MD;  Location: [facility] ENDOSCOPY;   Service: Endoscopy;  Laterality: N/A;   BIOPSY  01/30/2021   Procedure: BIOPSY;  Surgeon: Irene Shipper, MD;  Location: [facility] ENDOSCOPY;  Service: Endoscopy;;   BIOPSY  02/01/2021   Procedure: BIOPSY;  Surgeon: Ladene Artist, MD;  Location: [facility] ENDOSCOPY;  Service: Endoscopy;;   BIOPSY  02/08/2021   Procedure: BIOPSY;  Surgeon: [place] Park, MD;  Location: [facility] ENDOSCOPY;  Service: Gastroenterology;;   ESOPHAGOGASTRODUODENOSCOPY N/A 02/01/2021   Procedure: ESOPHAGOGASTRODUODENOSCOPY (EGD);  Surgeon: Ladene Artist, MD;  Location: [facility] ENDOSCOPY;  Service: Endoscopy;  Laterality: N/A;   ESOPHAGOGASTRODUODENOSCOPY (EGD) WITH PROPOFOL N/A 01/30/2021   Procedure: ESOPHAGOGASTRODUODENOSCOPY (EGD) WITH PROPOFOL;  Surgeon: Irene Shipper, MD;  Location: [facility] ENDOSCOPY;  Service: Endoscopy;  Laterality: N/A;   ESOPHAGOGASTRODUODENOSCOPY (EGD) WITH PROPOFOL N/A 02/08/2021   Procedure: ESOPHAGOGASTRODUODENOSCOPY (EGD) WITH PROPOFOL;  Surgeon: [place] Park, MD;  Location: [facility] ENDOSCOPY;  Service: Gastroenterology;  Laterality: N/A;   FOREIGN BODY REMOVAL  01/30/2021   Procedure: FOREIGN BODY REMOVAL;  Surgeon: Irene Shipper, MD;  Location: [place];  Service: Endoscopy;;   SAVORY DILATION N/A 02/01/2021   Procedure: Azzie Almas DILATION;  Surgeon: Ladene Artist, MD;  Location: [facility] ENDOSCOPY;  Service: Endoscopy;  Laterality: N/A;   Social History   Occupational History   Not on file  Tobacco Use   Smoking status: Never   Smokeless tobacco: Never  Vaping Use   Vaping Use: Never used  Substance and Sexual Activity   Alcohol use: Not Currently   Drug use: Never   Sexual activity: Not Currently

## 2022-08-03 NOTE — Progress Notes (Signed)
Numeric Pain Rating Scale and Functional Assessment Average Pain 0   In the last MONTH (on 0-10 scale) has pain interfered with the following?  1. General activity like being  able to carry out your everyday physical activities such as walking, climbing stairs, carrying groceries, or moving a chair?  Rating(0)   +Driver, -BT, -Dye Allergies.  Right shoulder and right hip pain

## 2022-08-13 MED ORDER — BUPIVACAINE HCL 0.25 % IJ SOLN
5.0000 mL | INTRAMUSCULAR | Status: AC | PRN
  Administered 2022-08-03: 5 mL via INTRA_ARTICULAR

## 2022-08-13 MED ORDER — TRIAMCINOLONE ACETONIDE 40 MG/ML IJ SUSP
40.0000 mg | INTRAMUSCULAR | Status: AC | PRN
  Administered 2022-08-03: 40 mg via INTRA_ARTICULAR

## 2022-08-13 MED ORDER — BUPIVACAINE HCL 0.25 % IJ SOLN
4.0000 mL | INTRAMUSCULAR | Status: AC | PRN
  Administered 2022-08-03: 4 mL via INTRA_ARTICULAR

## 2022-09-27 ENCOUNTER — Non-Acute Institutional Stay: Payer: Medicare Other | Admitting: Hospice

## 2022-09-27 DIAGNOSIS — M25551 Pain in right hip: Secondary | ICD-10-CM

## 2022-09-27 DIAGNOSIS — R531 Weakness: Secondary | ICD-10-CM

## 2022-09-27 DIAGNOSIS — R1319 Other dysphagia: Secondary | ICD-10-CM

## 2022-09-27 DIAGNOSIS — Z515 Encounter for palliative care: Secondary | ICD-10-CM

## 2022-09-27 DIAGNOSIS — K5901 Slow transit constipation: Secondary | ICD-10-CM

## 2022-09-27 NOTE — Progress Notes (Signed)
[place] Consult Note Telephone: [phone]  Fax: [phone]  PATIENT NAME: Ricky Sandoval [place] [address] [id number] (home)  DOB: 1922/07/10 MRN: [id number]  PRIMARY CARE PROVIDER:    [facility]  REFERRING PROVIDER:   [facility]  RESPONSIBLE PARTY:   Self Contact Information     Name Relation Home Work Mobile   [place] Son [phone]     Carol Ada Daughter [phone]          I met face to face with patient at facility. Palliative Care was asked to follow this patient  to address advance care planning, complex medical decision making and goals of care clarification.     ASSESSMENT AND / RECOMMENDATIONS:   CODE STATUS: Patient is a Do Not Resuscitate  Goals of Care: Goals include to maximize quality of life and symptom management  Symptom Management/Plan: Dysphagia: ST consult as needed.  Continue mechanical soft diet. . Education on small bites and sips of fluid during meals, sit up for at least 30 minutes after meals. Continue Carafate, Protonix as ordered.  Aspiration precautions.  Outpatient follow-up with GI as planned.  Right hip pain:Reports no pain today. Continue  Tylenol 500mg  BID prn pain Effective.  Constipation: reports having regular bowel movement. Continue Docusate sodium, Miralax as ordered.  Weakness: Continue PT/OT for strengthening, transfers. Patient gets around in wheel chair, self propels.   Fall precautions.   Follow up: Palliative care will continue to follow for complex medical decision making, advance care planning, and clarification of goals. Return 6 weeks or prn.Encouraged to call provider sooner with any concerns.   Family /Caregiver/Community Supports: Patient in [place] for ongoing care  HOSPICE ELIGIBILITY/DIAGNOSIS: TBD  Chief Complaint: Follow up visit.   HISTORY OF PRESENT ILLNESS:  Ricky Sandoval is a 89 y.o. year old male  with multiple medical  conditions requiring close monitoring/management with high risk of complications and morbidity: Esophageal dysphagia, hypertension, depression, hypothyroidism, Right hip pain. Patient denies pain today, endorses weakness, in no distress.  History obtained from review of EMR, discussion with primary team, caregiver, family and/or Mr. Eckert.  Review and summarization of Epic records shows history from other than patient. Rest of 10 point ROS asked and negative. Independent review and interpretation of available lab tests/diagnostics     PAST MEDICAL HISTORY:  Active Ambulatory Problems    Diagnosis Date Noted   Duodenal erosion    Esophageal stricture    Dysphagia    Duodenitis with bleeding    Ulcer of esophagus without bleeding    Gastritis and gastroduodenitis    Sepsis due to pneumonia ([place]) 04/19/2022   Narrow complex tachycardia 04/19/2022   Acute respiratory failure with hypoxia ([place]) 04/19/2022   Acute kidney injury superimposed on chronic kidney disease ([place]) 04/19/2022   Essential hypertension 04/19/2022   Normocytic anemia 04/19/2022   History of esophageal stricture 04/19/2022   H. influenzae septicemia ([place]) 04/20/2022   Resolved Ambulatory Problems    Diagnosis Date Noted   Food impaction of esophagus    Past Medical History:  Diagnosis Date   Diabetes type 2, controlled ([place])    High cholesterol    Hypertension    Hypothyroid     SOCIAL HX:  Social History   Tobacco Use   Smoking status: Never   Smokeless tobacco: Never  Substance Use Topics   Alcohol use: Not Currently     FAMILY HX: No family history on file.  ALLERGIES:  Allergies  Allergen Reactions   Zestril [Lisinopril] Other (See Comments)    Airway constriction      PERTINENT MEDICATIONS:  Outpatient Encounter Medications as of 09/27/2022  Medication Sig   acetaminophen (TYLENOL) 500 MG tablet Take 1,000 mg by mouth 2 (two) times daily.   aspirin 81 MG tablet Take 81 mg by mouth daily.    carboxymethylcellulose (REFRESH PLUS) 0.5 % SOLN Place 2 drops into both eyes 2 (two) times daily.   Cholecalciferol (VITAMIN D-3) 25 MCG (1000 UT) CAPS Take 2,000 Units by mouth daily.   cyanocobalamin 1000 MCG tablet Take 1 tablet (1,000 mcg total) by mouth daily.   diltiazem (CARDIZEM) 30 MG tablet Take 1 tablet (30 mg total) by mouth every 6 (six) hours as needed (Persistent tachycardia > 120).   docusate sodium (COLACE) 100 MG capsule Take 300 mg by mouth daily.   feeding supplement (ENSURE ENLIVE / ENSURE PLUS) LIQD Take 237 mLs by mouth 2 (two) times daily between meals.   fluticasone (FLONASE) 50 MCG/ACT nasal spray Place 1 spray into both nostrils in the morning.   folic acid (FOLVITE) 1 MG tablet Take 1 tablet (1 mg total) by mouth daily.   guaiFENesin (MUCINEX CHILDRENS PO) Take 20 mLs by mouth 2 (two) times daily. For 7 days. Starting 04/14/22   levothyroxine (SYNTHROID, LEVOTHROID) 75 MCG tablet Take 75 mcg by mouth daily before breakfast.   loratadine (CLARITIN) 10 MG tablet Take 10 mg by mouth daily. For 7 days. Starting 04/14/22   Multiple Vitamins-Minerals (PRESERVISION AREDS 2) CHEW Chew 2 tablets by mouth daily.   nortriptyline (PAMELOR) 10 MG capsule Take 1 capsule (10 mg total) by mouth at bedtime.   OVER THE COUNTER MEDICATION Apply 1 application  topically See admin instructions. Baza Protect 1%-12% cream. Apply to buttocks after each incontinent episode or urinating bed as needed.   pantoprazole (PROTONIX) 40 MG tablet Take 1 tablet (40 mg total) by mouth 2 (two) times daily before a meal.   sucralfate (CARAFATE) 1 g tablet Take 1 g by mouth 2 (two) times daily with a meal.   tamsulosin (FLOMAX) 0.4 MG CAPS capsule Take 0.4 mg by mouth at bedtime.   triamcinolone ointment (KENALOG) 0.1 % Apply 1 Application topically 2 (two) times daily. To affected area on left side below left buttocks. For 14 days. Starting 04/06/22   vitamin C (ASCORBIC ACID) 500 MG tablet Take 500 mg by  mouth daily.   No facility-administered encounter medications on file as of 09/27/2022.   I spent 45 minutes providing this consultation; this includes time spent with patient/family, chart review and documentation. More than 50% of the time in this consultation was spent on counseling and coordinating communication   Thank you for the opportunity to participate in the care of Mr. Thompson.  The palliative care team will continue to follow. Please call our office at [phone] if we can be of additional assistance.   Note: Portions of this note were generated with Lobbyist. Dictation errors may occur despite best attempts at proofreading.  Teodoro Spray, NP

## 2022-10-24 ENCOUNTER — Non-Acute Institutional Stay: Payer: Medicare Other | Admitting: Hospice

## 2022-10-24 DIAGNOSIS — R531 Weakness: Secondary | ICD-10-CM

## 2022-10-24 DIAGNOSIS — M25551 Pain in right hip: Secondary | ICD-10-CM

## 2022-10-24 DIAGNOSIS — R1319 Other dysphagia: Secondary | ICD-10-CM

## 2022-10-24 DIAGNOSIS — K5901 Slow transit constipation: Secondary | ICD-10-CM

## 2022-10-24 DIAGNOSIS — Z515 Encounter for palliative care: Secondary | ICD-10-CM

## 2022-10-24 NOTE — Progress Notes (Signed)
[place] Consult Note Telephone: [phone]  Fax: [phone]  PATIENT NAME: Ricky Sandoval [place] [address] [id number] (home)  DOB: [birth year] MRN: [id number]  PRIMARY CARE PROVIDER:    [facility]  REFERRING PROVIDER:   [facility]  RESPONSIBLE PARTY:   Self Contact Information     Name Relation Home Work Mobile   [place] Son [phone]     Carol Ada Daughter [phone]          I met face to face with patient at facility. Palliative Care was asked to follow this patient  to address advance care planning, complex medical decision making and goals of care clarification.     ASSESSMENT AND / RECOMMENDATIONS:   CODE STATUS: Patient is a Do Not Resuscitate  Goals of Care: Goals include to maximize quality of life and symptom management  Symptom Management/Plan: Esophageal dysphagia: No report of aspiration. ST consult as needed.  Continue mechanical soft diet. . Education on small bites and sips of fluid during meals, sit up for at least 30 minutes after meals. Continue Carafate, Protonix as ordered.  Aspiration precautions.  Outpatient follow-up with GI as planned.  Right hip pain: Stable continue  Tylenol '500mg'$  BID prn pain Effective.  Constipation: Improving continue Docusate sodium, Miralax as ordered.   Weakness: Continue PT/OT for strengthening, transfers. Patient gets around in wheel chair, self propels.  Energy conservation and balance of rest and performance activity.  Fall precautions.   Follow up: Palliative care will continue to follow for complex medical decision making, advance care planning, and clarification of goals. Return 6 weeks or prn.Encouraged to call provider sooner with any concerns.   Family /Caregiver/Community Supports: Patient in [place] for ongoing care  HOSPICE ELIGIBILITY/DIAGNOSIS: TBD  Chief Complaint: Follow up visit.   HISTORY OF PRESENT ILLNESS:  Ricky Sandoval is a 89 y.o. year old male  with multiple medical conditions requiring close monitoring/management with high risk of complications and morbidity: Esophageal dysphagia, hypertension, depression, hypothyroidism, Right hip pain. Patient denies pain today, endorses weakness, in no distress.  He reports he is overall doing well, enjoys participating in facility activities such as tell the tune, bingo. History obtained from review of EMR, discussion with primary team, caregiver, family and/or Ricky Sandoval.  Review and summarization of Epic records shows history from other than patient. Rest of 10 point ROS asked and negative. Independent review and interpretation of available lab tests/diagnostics     PAST MEDICAL HISTORY:  Active Ambulatory Problems    Diagnosis Date Noted   Duodenal erosion    Esophageal stricture    Dysphagia    Duodenitis with bleeding    Ulcer of esophagus without bleeding    Gastritis and gastroduodenitis    Sepsis due to pneumonia ([place]) 04/19/2022   Narrow complex tachycardia 04/19/2022   Acute respiratory failure with hypoxia ([place]) 04/19/2022   Acute kidney injury superimposed on chronic kidney disease ([place]) 04/19/2022   Essential hypertension 04/19/2022   Normocytic anemia 04/19/2022   History of esophageal stricture 04/19/2022   H. influenzae septicemia ([place]) 04/20/2022   Resolved Ambulatory Problems    Diagnosis Date Noted   Food impaction of esophagus    Past Medical History:  Diagnosis Date   Diabetes type 2, controlled ([place])    High cholesterol    Hypertension    Hypothyroid     SOCIAL HX:  Social History   Tobacco Use   Smoking status: Never  Smokeless tobacco: Never  Substance Use Topics   Alcohol use: Not Currently     FAMILY HX: No family history on file.    ALLERGIES:  Allergies  Allergen Reactions   Zestril [Lisinopril] Other (See Comments)    Airway constriction      PERTINENT MEDICATIONS:  Outpatient Encounter Medications as  of 10/24/2022  Medication Sig   acetaminophen (TYLENOL) 500 MG tablet Take 1,000 mg by mouth 2 (two) times daily.   aspirin 81 MG tablet Take 81 mg by mouth daily.   carboxymethylcellulose (REFRESH PLUS) 0.5 % SOLN Place 2 drops into both eyes 2 (two) times daily.   Cholecalciferol (VITAMIN D-3) 25 MCG (1000 UT) CAPS Take 2,000 Units by mouth daily.   cyanocobalamin 1000 MCG tablet Take 1 tablet (1,000 mcg total) by mouth daily.   diltiazem (CARDIZEM) 30 MG tablet Take 1 tablet (30 mg total) by mouth every 6 (six) hours as needed (Persistent tachycardia > 120).   docusate sodium (COLACE) 100 MG capsule Take 300 mg by mouth daily.   feeding supplement (ENSURE ENLIVE / ENSURE PLUS) LIQD Take 237 mLs by mouth 2 (two) times daily between meals.   fluticasone (FLONASE) 50 MCG/ACT nasal spray Place 1 spray into both nostrils in the morning.   folic acid (FOLVITE) 1 MG tablet Take 1 tablet (1 mg total) by mouth daily.   guaiFENesin (MUCINEX CHILDRENS PO) Take 20 mLs by mouth 2 (two) times daily. For 7 days. Starting 04/14/22   levothyroxine (SYNTHROID, LEVOTHROID) 75 MCG tablet Take 75 mcg by mouth daily before breakfast.   loratadine (CLARITIN) 10 MG tablet Take 10 mg by mouth daily. For 7 days. Starting 04/14/22   Multiple Vitamins-Minerals (PRESERVISION AREDS 2) CHEW Chew 2 tablets by mouth daily.   nortriptyline (PAMELOR) 10 MG capsule Take 1 capsule (10 mg total) by mouth at bedtime.   OVER THE COUNTER MEDICATION Apply 1 application  topically See admin instructions. Baza Protect 1%-12% cream. Apply to buttocks after each incontinent episode or urinating bed as needed.   pantoprazole (PROTONIX) 40 MG tablet Take 1 tablet (40 mg total) by mouth 2 (two) times daily before a meal.   sucralfate (CARAFATE) 1 g tablet Take 1 g by mouth 2 (two) times daily with a meal.   tamsulosin (FLOMAX) 0.4 MG CAPS capsule Take 0.4 mg by mouth at bedtime.   triamcinolone ointment (KENALOG) 0.1 % Apply 1 Application  topically 2 (two) times daily. To affected area on left side below left buttocks. For 14 days. Starting 04/06/22   vitamin C (ASCORBIC ACID) 500 MG tablet Take 500 mg by mouth daily.   No facility-administered encounter medications on file as of 10/24/2022.   I spent 37mnutes providing this consultation; this includes time spent with patient/family, chart review and documentation. More than 50% of the time in this consultation was spent on counseling and coordinating communication   Thank you for the opportunity to participate in the care of Mr. MDingley  The palliative care team will continue to follow. Please call our office at [phone] we can be of additional assistance.   Note: Portions of this note were generated with DLobbyist Dictation errors may occur despite best attempts at proofreading.  LTeodoro Spray NP

## 2022-11-23 ENCOUNTER — Non-Acute Institutional Stay: Payer: Medicare Other | Admitting: Hospice

## 2022-11-23 DIAGNOSIS — R1319 Other dysphagia: Secondary | ICD-10-CM

## 2022-11-23 DIAGNOSIS — K5901 Slow transit constipation: Secondary | ICD-10-CM

## 2022-11-23 DIAGNOSIS — M25551 Pain in right hip: Secondary | ICD-10-CM

## 2022-11-23 DIAGNOSIS — Z515 Encounter for palliative care: Secondary | ICD-10-CM

## 2022-11-23 NOTE — Progress Notes (Signed)
[place] Consult Note Telephone: [phone]  Fax: [phone]  PATIENT NAME: Ricky Sandoval [place] [address] [phone] (home)  DOB: 01-11-1922 MRN: [id number]  PRIMARY CARE PROVIDER:    [facility]  REFERRING PROVIDER:   [facility]  RESPONSIBLE PARTY:   Self Contact Information     Name Relation Home Work Mobile   [name] [phone]     [place] Daughter [phone]          I met face to face with patient at facility. Palliative Care was asked to follow this patient  to address advance care planning, complex medical decision making and goals of care clarification.      ASSESSMENT AND / RECOMMENDATIONS:   CODE STATUS: Patient is a Do Not Resuscitate  Goals of Care: Goals include to maximize quality of life and symptom management  Symptom Management/Plan: Esophageal dysphagia:  Continue mechanical soft diet. Education on small bites and sips of fluid during meals, sit up for at least 30 minutes after meals. Continue Carafate, Protonix as ordered.  Aspiration precautions.  Outpatient follow-up with GI as planned.. ST consult as needed.    Right hip pain: Stable,  continue  Tylenol 500mg  BID prn pain Effective.   Constipation:continue Docusate sodium, Miralax as ordered.   Follow up: Palliative care will continue to follow for complex medical decision making, advance care planning, and clarification of goals. Return 6 weeks or prn.Encouraged to call provider sooner with any concerns.   Family /Caregiver/Community Supports: Patient in [place] for ongoing care  HOSPICE ELIGIBILITY/DIAGNOSIS: TBD  Chief Complaint: Follow up visit.   HISTORY OF PRESENT ILLNESS:  Ricky Sandoval is a 89 y.o. year old male  with multiple medical conditions requiring close monitoring/management with high risk of complications and morbidity: Esophageal dysphagia, hypertension, depression, hypothyroidism, Right hip  pain. Patient denies pain today, endorses weakness, in no distress.  He denies pain/discomfort, reports he is overall doing well, enjoys participating in facility activities such as tell-the-tune, bingo. History obtained from review of EMR, discussion with primary team, caregiver, family and/or [name].  Review and summarization of Epic records shows history from other than patient. Rest of 10 point ROS asked and negative. Independent review and interpretation of available lab tests/diagnostics     PAST MEDICAL HISTORY:  Active Ambulatory Problems    Diagnosis Date Noted   Duodenal erosion    Esophageal stricture    Dysphagia    Duodenitis with bleeding    Ulcer of esophagus without bleeding    Gastritis and gastroduodenitis    Sepsis due to pneumonia ([place]) 04/19/2022   Narrow complex tachycardia 04/19/2022   Acute respiratory failure with hypoxia ([place]) 04/19/2022   Acute kidney injury superimposed on chronic kidney disease ([place]) 04/19/2022   Essential hypertension 04/19/2022   Normocytic anemia 04/19/2022   History of esophageal stricture 04/19/2022   H. influenzae septicemia ([place]) 04/20/2022   Resolved Ambulatory Problems    Diagnosis Date Noted   Food impaction of esophagus    Past Medical History:  Diagnosis Date   Diabetes type 2, controlled ([place])    High cholesterol    Hypertension    Hypothyroid     SOCIAL HX:  Social History   Tobacco Use   Smoking status: Never   Smokeless tobacco: Never  Substance Use Topics   Alcohol use: Not Currently     FAMILY HX: No family history on file.    ALLERGIES:  Allergies  Allergen Reactions   Zestril [Lisinopril] Other (See Comments)    Airway constriction      PERTINENT MEDICATIONS:  Outpatient Encounter Medications as of 11/23/2022  Medication Sig   acetaminophen (TYLENOL) 500 MG tablet Take 1,000 mg by mouth 2 (two) times daily.   aspirin 81 MG tablet Take 81 mg by mouth daily.   carboxymethylcellulose (REFRESH  PLUS) 0.5 % SOLN Place 2 drops into both eyes 2 (two) times daily.   Cholecalciferol (VITAMIN D-3) 25 MCG (1000 UT) CAPS Take 2,000 Units by mouth daily.   cyanocobalamin 1000 MCG tablet Take 1 tablet (1,000 mcg total) by mouth daily.   diltiazem (CARDIZEM) 30 MG tablet Take 1 tablet (30 mg total) by mouth every 6 (six) hours as needed (Persistent tachycardia > 120).   docusate sodium (COLACE) 100 MG capsule Take 300 mg by mouth daily.   feeding supplement (ENSURE ENLIVE / ENSURE PLUS) LIQD Take 237 mLs by mouth 2 (two) times daily between meals.   fluticasone (FLONASE) 50 MCG/ACT nasal spray Place 1 spray into both nostrils in the morning.   folic acid (FOLVITE) 1 MG tablet Take 1 tablet (1 mg total) by mouth daily.   guaiFENesin (MUCINEX CHILDRENS PO) Take 20 mLs by mouth 2 (two) times daily. For 7 days. Starting 04/14/22   levothyroxine (SYNTHROID, LEVOTHROID) 75 MCG tablet Take 75 mcg by mouth daily before breakfast.   loratadine (CLARITIN) 10 MG tablet Take 10 mg by mouth daily. For 7 days. Starting 04/14/22   Multiple Vitamins-Minerals (PRESERVISION AREDS 2) CHEW Chew 2 tablets by mouth daily.   nortriptyline (PAMELOR) 10 MG capsule Take 1 capsule (10 mg total) by mouth at bedtime.   OVER THE COUNTER MEDICATION Apply 1 application  topically See admin instructions. Baza Protect 1%-12% cream. Apply to buttocks after each incontinent episode or urinating bed as needed.   pantoprazole (PROTONIX) 40 MG tablet Take 1 tablet (40 mg total) by mouth 2 (two) times daily before a meal.   sucralfate (CARAFATE) 1 g tablet Take 1 g by mouth 2 (two) times daily with a meal.   tamsulosin (FLOMAX) 0.4 MG CAPS capsule Take 0.4 mg by mouth at bedtime.   triamcinolone ointment (KENALOG) 0.1 % Apply 1 Application topically 2 (two) times daily. To affected area on left side below left buttocks. For 14 days. Starting 04/06/22   vitamin C (ASCORBIC ACID) 500 MG tablet Take 500 mg by mouth daily.   No  facility-administered encounter medications on file as of 11/23/2022.   I spent 40 minutes providing this consultation; this includes time spent with patient/family, chart review and documentation. More than 50% of the time in this consultation was spent on counseling and coordinating communication   Thank you for the opportunity to participate in the care of Mr. Aftab.  The palliative care team will continue to follow. Please call our office at [phone] if we can be of additional assistance.   Note: Portions of this note were generated with Lobbyist. Dictation errors may occur despite best attempts at proofreading.  Teodoro Spray, NP

## 2023-01-02 ENCOUNTER — Non-Acute Institutional Stay: Payer: Medicare Other | Admitting: Hospice

## 2023-01-02 DIAGNOSIS — R1319 Other dysphagia: Secondary | ICD-10-CM

## 2023-01-02 DIAGNOSIS — K5901 Slow transit constipation: Secondary | ICD-10-CM

## 2023-01-02 DIAGNOSIS — M25551 Pain in right hip: Secondary | ICD-10-CM

## 2023-01-02 DIAGNOSIS — Z515 Encounter for palliative care: Secondary | ICD-10-CM

## 2023-01-02 NOTE — Progress Notes (Signed)
Therapist, nutritional Palliative Care Consult Note Telephone: [phone]  Fax: [phone]  PATIENT NAME: Ricky Sandoval [address] [place] [address] [phone] (home)  DOB: 1922/04/25 MRN: [id number]  PRIMARY CARE PROVIDER:    [facility]  REFERRING PROVIDER:   [facility]  RESPONSIBLE PARTY:   Self Contact Information     Name Relation Home Work Mobile   [place] Son [phone]     Barnab[place] Lister Daughter [phone]          I met face to face with patient at facility. Palliative Care was asked to follow this patient  to address advance care planning, complex medical decision making and goals of care clarification.      ASSESSMENT AND / RECOMMENDATIONS:   CODE STATUS: Patient is a Do Not Resuscitate  Goals of Care: Goals include to maximize quality of life and symptom management  Symptom Management/Plan: Esophageal dysphagia: Nursing with no report of aspiration.  Continue mechanical soft diet, aspiration precautions. Education on small bites and sips of fluid during meals, sit up for at least 30 minutes after meals. Continue Carafate, Protonix as ordered. Outpatient follow-up with GI as planned.. ST consult as needed.    Right hip pain: Stable,  continue  Tylenol 500mg  BID prn pain Effective.   Constipation:continue Docusate sodium, Miralax as ordered.   Follow up: Palliative care will continue to follow for complex medical decision making, advance care planning, and clarification of goals. Return 6 weeks or prn.Encouraged to call provider sooner with any concerns.   Family /Caregiver/Community Supports: Patient in AL for ongoing care  HOSPICE ELIGIBILITY/DIAGNOSIS: TBD  Chief Complaint: Follow up visit.   HISTORY OF PRESENT ILLNESS:  Ricky Sandoval is a 89 y.o. year old male  with multiple medical conditions requiring close monitoring/management with high risk of complications and morbidity: Esophageal dysphagia, hypertension,  depression, hypothyroidism, Right hip pain. Patient denies pain today, endorses weakness, in no distress, reports he is overall doing well, enjoys participating in facility activities such as tell-the-tune, bingo. Encouraged him to also participate in  stretch and strengthen, game nights, crafters corner and activity brainstorming. No hospitalization since last visit; nursing with no concerns today.  History obtained from review of EMR, discussion with primary team, caregiver, family and/or Ricky Sandoval.  Review and summarization of Epic records shows history from other than patient. Rest of 10 point ROS asked and negative. Independent review and interpretation of available lab tests/diagnostics     PAST MEDICAL HISTORY:  Active Ambulatory Problems    Diagnosis Date Noted   Duodenal erosion    Esophageal stricture    Dysphagia    Duodenitis with bleeding    Ulcer of esophagus without bleeding    Gastritis and gastroduodenitis    Sepsis due to pneumonia ([facility]) 04/19/2022   Narrow complex tachycardia 04/19/2022   Acute respiratory failure with hypoxia ([facility]) 04/19/2022   Acute kidney injury superimposed on chronic kidney disease ([facility]) 04/19/2022   Essential hypertension 04/19/2022   Normocytic anemia 04/19/2022   History of esophageal stricture 04/19/2022   H. influenzae septicemia ([facility]) 04/20/2022   Resolved Ambulatory Problems    Diagnosis Date Noted   Food impaction of esophagus    Past Medical History:  Diagnosis Date   Diabetes type 2, controlled ([facility])    High cholesterol    Hypertension    Hypothyroid     SOCIAL HX:  Social History   Tobacco Use   Smoking status: Never   Smokeless tobacco:  Never  Substance Use Topics   Alcohol use: Not Currently     FAMILY HX: No family history on file.    ALLERGIES:  Allergies  Allergen Reactions   Zestril [Lisinopril] Other (See Comments)    Airway constriction      PERTINENT MEDICATIONS:  Outpatient Encounter Medications as  of 01/02/2023  Medication Sig   acetaminophen (TYLENOL) 500 MG tablet Take 1,000 mg by mouth 2 (two) times daily.   aspirin 81 MG tablet Take 81 mg by mouth daily.   carboxymethylcellulose (REFRESH PLUS) 0.5 % SOLN Place 2 drops into both eyes 2 (two) times daily.   Cholecalciferol (VITAMIN D-3) 25 MCG (1000 UT) CAPS Take 2,000 Units by mouth daily.   cyanocobalamin 1000 MCG tablet Take 1 tablet (1,000 mcg total) by mouth daily.   diltiazem (CARDIZEM) 30 MG tablet Take 1 tablet (30 mg total) by mouth every 6 (six) hours as needed (Persistent tachycardia > 120).   docusate sodium (COLACE) 100 MG capsule Take 300 mg by mouth daily.   feeding supplement (ENSURE ENLIVE / ENSURE PLUS) LIQD Take 237 mLs by mouth 2 (two) times daily between meals.   fluticasone (FLONASE) 50 MCG/ACT nasal spray Place 1 spray into both nostrils in the morning.   folic acid (FOLVITE) 1 MG tablet Take 1 tablet (1 mg total) by mouth daily.   guaiFENesin (MUCINEX CHILDRENS PO) Take 20 mLs by mouth 2 (two) times daily. For 7 days. Starting 04/14/22   levothyroxine (SYNTHROID, LEVOTHROID) 75 MCG tablet Take 75 mcg by mouth daily before breakfast.   loratadine (CLARITIN) 10 MG tablet Take 10 mg by mouth daily. For 7 days. Starting 04/14/22   Multiple Vitamins-Minerals (PRESERVISION AREDS 2) CHEW Chew 2 tablets by mouth daily.   nortriptyline (PAMELOR) 10 MG capsule Take 1 capsule (10 mg total) by mouth at bedtime.   OVER THE COUNTER MEDICATION Apply 1 application  topically See admin instructions. Baza Protect 1%-12% cream. Apply to buttocks after each incontinent episode or urinating bed as needed.   pantoprazole (PROTONIX) 40 MG tablet Take 1 tablet (40 mg total) by mouth 2 (two) times daily before a meal.   sucralfate (CARAFATE) 1 g tablet Take 1 g by mouth 2 (two) times daily with a meal.   tamsulosin (FLOMAX) 0.4 MG CAPS capsule Take 0.4 mg by mouth at bedtime.   triamcinolone ointment (KENALOG) 0.1 % Apply 1 Application  topically 2 (two) times daily. To affected area on left side below left buttocks. For 14 days. Starting 04/06/22   vitamin C (ASCORBIC ACID) 500 MG tablet Take 500 mg by mouth daily.   No facility-administered encounter medications on file as of 01/02/2023.   I spent 40 minutes providing this consultation; this includes time spent with patient/family, chart review and documentation. More than 50% of the time in this consultation was spent on counseling and coordinating communication   Thank you for the opportunity to participate in the care of Mr. Harbaugh.  The palliative care team will continue to follow. Please call our office at [phone] if we can be of additional assistance.   Note: Portions of this note were generated with Scientist, clinical (histocompatibility and immunogenetics). Dictation errors may occur despite best attempts at proofreading.  Rosaura Carpenter, NP

## 2023-02-13 ENCOUNTER — Non-Acute Institutional Stay: Payer: Medicare Other | Admitting: Hospice

## 2023-02-13 DIAGNOSIS — Z515 Encounter for palliative care: Secondary | ICD-10-CM

## 2023-02-13 DIAGNOSIS — K5901 Slow transit constipation: Secondary | ICD-10-CM

## 2023-02-13 DIAGNOSIS — M25551 Pain in right hip: Secondary | ICD-10-CM

## 2023-02-13 DIAGNOSIS — R1319 Other dysphagia: Secondary | ICD-10-CM

## 2023-02-13 NOTE — Progress Notes (Signed)
Therapist, nutritional Palliative Care Consult Note Telephone: [phone]  Fax: [phone]  PATIENT NAME: Ricky Sandoval [address] [place] [address] [phone] (home)  DOB: 1921/09/12 MRN: [id number]  PRIMARY CARE PROVIDER:    [facility]  REFERRING PROVIDER:   [facility]  RESPONSIBLE PARTY:   Self Contact Information     Name Relation Home Work Mobile   [place] Son [phone]     [place]ab[place] Lister Daughter [phone]          I met face to face with patient at facility. Palliative Care was asked to follow this patient  to address advance care planning, complex medical decision making and goals of care clarification.      ASSESSMENT AND / RECOMMENDATIONS:   CODE STATUS: Patient is a Do Not Resuscitate  Goals of Care: Goals include to maximize quality of life and symptom management  Symptom Management/Plan: PC f/u visit to monitor trends of appetite, weights, monitor for functional, cognitive decline with chronic disease progression.  Patient in no acuities since last visit, no hospitalization or ED visits.  No changes of care at this time. Esophageal dysphagia: Monitor for coughing/choking.  Continue mechanical soft diet, aspiration precautions. Education on small bites and sips of fluid during meals, sit up for at least 30 minutes after meals. Continue Carafate, Protonix as ordered. Outpatient follow-up with GI as planned.. ST consult as needed.    Right hip pain: Chronic/stable, continue  Tylenol 500mg  BID prn pain Effective.   Constipation:continue Docusate sodium, Miralax as ordered.   Follow up: Palliative care will continue to follow for complex medical decision making, advance care planning, and clarification of goals. Return 6 weeks or prn.Encouraged to call provider sooner with any concerns.   Family /Caregiver/Community Supports: Patient in AL for ongoing care  HOSPICE ELIGIBILITY/DIAGNOSIS: TBD  Chief Complaint: Follow up  visit.   HISTORY OF PRESENT ILLNESS:  Ricky Sandoval is a 89 y.o. year old male  with multiple medical conditions requiring close monitoring/management with high risk of complications and morbidity: Esophageal dysphagia, hypertension, depression, hypothyroidism, Right hip pain. Patient denies pain today, reports he is overall doing well, enjoys participating in facility activities such as tell-the-tune, bingo. Encouraged him to also participate in  stretch and strengthen, game nights, crafters corner and activity brainstorming. No hospitalization since last visit; nursing with no concerns today.  History obtained from review of EMR, discussion with primary team, caregiver, family and/or Mr. Foppiano.  Review and summarization of Epic records shows history from other than patient. Rest of 10 point ROS asked and negative. Independent review and interpretation of available lab tests/diagnostics     PAST MEDICAL HISTORY:  Active Ambulatory Problems    Diagnosis Date Noted   Duodenal erosion    Esophageal stricture    Dysphagia    Duodenitis with bleeding    Ulcer of esophagus without bleeding    Gastritis and gastroduodenitis    Sepsis due to pneumonia ([facility]) 04/19/2022   Narrow complex tachycardia 04/19/2022   Acute respiratory failure with hypoxia ([facility]) 04/19/2022   Acute kidney injury superimposed on chronic kidney disease ([facility]) 04/19/2022   Essential hypertension 04/19/2022   Normocytic anemia 04/19/2022   History of esophageal stricture 04/19/2022   H. influenzae septicemia ([facility]) 04/20/2022   Resolved Ambulatory Problems    Diagnosis Date Noted   Food impaction of esophagus    Past Medical History:  Diagnosis Date   Diabetes type 2, controlled ([facility])    High cholesterol  Hypertension    Hypothyroid     SOCIAL HX:  Social History   Tobacco Use   Smoking status: Never   Smokeless tobacco: Never  Substance Use Topics   Alcohol use: Not Currently     FAMILY HX: No family  history on file.    ALLERGIES:  Allergies  Allergen Reactions   Zestril [Lisinopril] Other (See Comments)    Airway constriction      PERTINENT MEDICATIONS:  Outpatient Encounter Medications as of 02/13/2023  Medication Sig   acetaminophen (TYLENOL) 500 MG tablet Take 1,000 mg by mouth 2 (two) times daily.   aspirin 81 MG tablet Take 81 mg by mouth daily.   carboxymethylcellulose (REFRESH PLUS) 0.5 % SOLN Place 2 drops into both eyes 2 (two) times daily.   Cholecalciferol (VITAMIN D-3) 25 MCG (1000 UT) CAPS Take 2,000 Units by mouth daily.   cyanocobalamin 1000 MCG tablet Take 1 tablet (1,000 mcg total) by mouth daily.   diltiazem (CARDIZEM) 30 MG tablet Take 1 tablet (30 mg total) by mouth every 6 (six) hours as needed (Persistent tachycardia > 120).   docusate sodium (COLACE) 100 MG capsule Take 300 mg by mouth daily.   feeding supplement (ENSURE ENLIVE / ENSURE PLUS) LIQD Take 237 mLs by mouth 2 (two) times daily between meals.   fluticasone (FLONASE) 50 MCG/ACT nasal spray Place 1 spray into both nostrils in the morning.   folic acid (FOLVITE) 1 MG tablet Take 1 tablet (1 mg total) by mouth daily.   guaiFENesin (MUCINEX CHILDRENS PO) Take 20 mLs by mouth 2 (two) times daily. For 7 days. Starting 04/14/22   levothyroxine (SYNTHROID, LEVOTHROID) 75 MCG tablet Take 75 mcg by mouth daily before breakfast.   loratadine (CLARITIN) 10 MG tablet Take 10 mg by mouth daily. For 7 days. Starting 04/14/22   Multiple Vitamins-Minerals (PRESERVISION AREDS 2) CHEW Chew 2 tablets by mouth daily.   nortriptyline (PAMELOR) 10 MG capsule Take 1 capsule (10 mg total) by mouth at bedtime.   OVER THE COUNTER MEDICATION Apply 1 application  topically See admin instructions. Baza Protect 1%-12% cream. Apply to buttocks after each incontinent episode or urinating bed as needed.   pantoprazole (PROTONIX) 40 MG tablet Take 1 tablet (40 mg total) by mouth 2 (two) times daily before a meal.   sucralfate (CARAFATE)  1 g tablet Take 1 g by mouth 2 (two) times daily with a meal.   tamsulosin (FLOMAX) 0.4 MG CAPS capsule Take 0.4 mg by mouth at bedtime.   triamcinolone ointment (KENALOG) 0.1 % Apply 1 Application topically 2 (two) times daily. To affected area on left side below left buttocks. For 14 days. Starting 04/06/22   vitamin C (ASCORBIC ACID) 500 MG tablet Take 500 mg by mouth daily.   No facility-administered encounter medications on file as of 02/13/2023.   I spent 40 minutes providing this consultation; this includes time spent with patient/family, chart review and documentation. More than 50% of the time in this consultation was spent on counseling and coordinating communication   Thank you for the opportunity to participate in the care of Mr. Ackermann.  The palliative care team will continue to follow. Please call our office at [phone] if we can be of additional assistance.   Note: Portions of this note were generated with Scientist, clinical (histocompatibility and immunogenetics). Dictation errors may occur despite best attempts at proofreading.  Rosaura Carpenter, NP
